# Patient Record
Sex: Female | Born: 1965 | Race: White | Hispanic: Yes | Smoking: Former smoker
Health system: Southern US, Community
[De-identification: ages and names within clinical notes are randomized; demographics above are authoritative.]

## PROBLEM LIST (undated history)

## (undated) DIAGNOSIS — E271 Primary adrenocortical insufficiency: Secondary | ICD-10-CM

## (undated) DIAGNOSIS — E039 Hypothyroidism, unspecified: Secondary | ICD-10-CM

## (undated) DIAGNOSIS — I639 Cerebral infarction, unspecified: Secondary | ICD-10-CM

## (undated) DIAGNOSIS — Z7901 Long term (current) use of anticoagulants: Secondary | ICD-10-CM

## (undated) DIAGNOSIS — R760 Raised antibody titer: Secondary | ICD-10-CM

## (undated) DIAGNOSIS — I69391 Dysphagia following cerebral infarction: Secondary | ICD-10-CM

## (undated) DIAGNOSIS — D6861 Antiphospholipid syndrome: Principal | ICD-10-CM

## (undated) DIAGNOSIS — K754 Autoimmune hepatitis: Secondary | ICD-10-CM

## (undated) HISTORY — DX: Hypothyroidism, unspecified: E03.9

## (undated) HISTORY — DX: Raised antibody titer: R76.0

## (undated) HISTORY — DX: Long term (current) use of anticoagulants: Z79.01

## (undated) HISTORY — PX: TONSILLECTOMY: SUR1361

## (undated) HISTORY — PX: APPENDECTOMY: SHX54

## (undated) HISTORY — DX: Autoimmune hepatitis: K75.4

## (undated) HISTORY — DX: Antiphospholipid syndrome: D68.61

## (undated) HISTORY — DX: Primary adrenocortical insufficiency: E27.1

## (undated) HISTORY — DX: Cerebral infarction, unspecified: I63.9

## (undated) HISTORY — DX: Dysphagia following cerebral infarction: I69.391

## (undated) HISTORY — PX: LASIK: SHX215

---

## 2005-04-29 ENCOUNTER — Other Ambulatory Visit: Admission: RE | Admit: 2005-04-29 | Discharge: 2005-04-29 | Payer: Self-pay | Admitting: Obstetrics and Gynecology

## 2005-10-18 ENCOUNTER — Encounter: Admission: RE | Admit: 2005-10-18 | Discharge: 2005-10-18 | Payer: Self-pay | Admitting: Obstetrics and Gynecology

## 2005-11-04 ENCOUNTER — Encounter: Admission: RE | Admit: 2005-11-04 | Discharge: 2005-11-04 | Payer: Self-pay | Admitting: Obstetrics and Gynecology

## 2007-05-18 ENCOUNTER — Encounter: Admission: RE | Admit: 2007-05-18 | Discharge: 2007-05-18 | Payer: Self-pay | Admitting: Obstetrics and Gynecology

## 2007-10-06 ENCOUNTER — Other Ambulatory Visit: Admission: RE | Admit: 2007-10-06 | Discharge: 2007-10-06 | Payer: Self-pay | Admitting: Obstetrics and Gynecology

## 2008-08-08 ENCOUNTER — Encounter (INDEPENDENT_AMBULATORY_CARE_PROVIDER_SITE_OTHER): Payer: Self-pay | Admitting: *Deleted

## 2008-11-22 LAB — CONVERTED CEMR LAB: Pap Smear: NORMAL

## 2008-12-12 LAB — HM MAMMOGRAPHY: HM Mammogram: NORMAL

## 2010-05-18 ENCOUNTER — Ambulatory Visit: Payer: Self-pay | Admitting: Family Medicine

## 2010-05-22 DIAGNOSIS — I639 Cerebral infarction, unspecified: Secondary | ICD-10-CM

## 2010-05-22 HISTORY — DX: Cerebral infarction, unspecified: I63.9

## 2010-05-29 ENCOUNTER — Ambulatory Visit: Payer: Self-pay | Admitting: Family Medicine

## 2010-05-29 LAB — CONVERTED CEMR LAB
Bilirubin Urine: NEGATIVE
Glucose, Urine, Semiquant: NEGATIVE
Specific Gravity, Urine: 1.025
WBC Urine, dipstick: NEGATIVE
pH: 6

## 2010-06-05 ENCOUNTER — Encounter: Payer: Self-pay | Admitting: Family Medicine

## 2010-06-05 LAB — CONVERTED CEMR LAB
Albumin: 3.8 g/dL (ref 3.5–5.2)
Basophils Absolute: 0 10*3/uL (ref 0.0–0.1)
CO2: 27 meq/L (ref 19–32)
Calcium: 8.7 mg/dL (ref 8.4–10.5)
Eosinophils Absolute: 0.2 10*3/uL (ref 0.0–0.7)
Glucose, Bld: 84 mg/dL (ref 70–99)
HCT: 36.9 % (ref 36.0–46.0)
HDL: 39.2 mg/dL (ref >39.00)
Hemoglobin: 12.7 g/dL (ref 12.0–15.0)
Lymphs Abs: 1.4 10*3/uL (ref 0.7–4.0)
MCHC: 34.5 g/dL (ref 30.0–36.0)
Monocytes Absolute: 0.3 10*3/uL (ref 0.1–1.0)
Neutro Abs: 2.6 10*3/uL (ref 1.4–7.7)
Potassium: 4.3 meq/L (ref 3.5–5.1)
RDW: 12.9 % (ref 11.5–14.6)
Sodium: 141 meq/L (ref 135–145)
TSH: 10.52 microintl units/mL — ABNORMAL HIGH (ref 0.35–5.50)

## 2010-06-23 ENCOUNTER — Encounter
Admission: RE | Admit: 2010-06-23 | Discharge: 2010-09-21 | Payer: Self-pay | Admitting: Physical Medicine & Rehabilitation

## 2010-06-23 ENCOUNTER — Telehealth: Payer: Self-pay | Admitting: Internal Medicine

## 2010-06-23 ENCOUNTER — Encounter: Payer: Self-pay | Admitting: Internal Medicine

## 2010-06-24 ENCOUNTER — Encounter: Payer: Self-pay | Admitting: Internal Medicine

## 2010-06-25 ENCOUNTER — Ambulatory Visit: Payer: Self-pay | Admitting: Physical Medicine & Rehabilitation

## 2010-06-25 ENCOUNTER — Ambulatory Visit: Payer: Self-pay | Admitting: Internal Medicine

## 2010-06-26 ENCOUNTER — Ambulatory Visit: Payer: Self-pay | Admitting: Internal Medicine

## 2010-06-26 LAB — CONVERTED CEMR LAB: INR: 3

## 2010-06-29 ENCOUNTER — Ambulatory Visit: Payer: Self-pay | Admitting: Internal Medicine

## 2010-06-29 DIAGNOSIS — E039 Hypothyroidism, unspecified: Secondary | ICD-10-CM | POA: Insufficient documentation

## 2010-06-29 LAB — CONVERTED CEMR LAB: INR: 3.2

## 2010-06-30 ENCOUNTER — Ambulatory Visit: Payer: Self-pay | Admitting: Family Medicine

## 2010-07-01 ENCOUNTER — Ambulatory Visit: Payer: Self-pay | Admitting: Oncology

## 2010-07-02 ENCOUNTER — Encounter: Payer: Self-pay | Admitting: Cardiology

## 2010-07-02 ENCOUNTER — Encounter: Payer: Self-pay | Admitting: Family Medicine

## 2010-07-07 ENCOUNTER — Ambulatory Visit: Payer: Self-pay | Admitting: Family Medicine

## 2010-07-07 ENCOUNTER — Encounter
Admission: RE | Admit: 2010-07-07 | Discharge: 2010-10-05 | Payer: Self-pay | Source: Home / Self Care | Admitting: Physical Medicine & Rehabilitation

## 2010-07-09 ENCOUNTER — Encounter: Payer: Self-pay | Admitting: Cardiology

## 2010-07-13 ENCOUNTER — Telehealth (INDEPENDENT_AMBULATORY_CARE_PROVIDER_SITE_OTHER): Payer: Self-pay

## 2010-07-14 LAB — CBC & DIFF AND RETIC
BASO%: 0.5 % (ref 0.0–2.0)
LYMPH%: 25.3 % (ref 14.0–49.7)
MCHC: 35.6 g/dL (ref 31.5–36.0)
MONO#: 0.3 10*3/uL (ref 0.1–0.9)
Platelets: 230 10*3/uL (ref 145–400)
RBC: 4.33 10*6/uL (ref 3.70–5.45)
Retic %: 0.52 % (ref 0.50–1.50)
WBC: 5.8 10*3/uL (ref 3.9–10.3)
lymph#: 1.5 10*3/uL (ref 0.9–3.3)

## 2010-07-14 LAB — CHCC SMEAR

## 2010-07-14 LAB — PROTIME-INR
INR: 1.7 — ABNORMAL LOW (ref 2.00–3.50)
Protime: 20.4 Seconds — ABNORMAL HIGH (ref 10.6–13.4)

## 2010-07-14 LAB — APTT: aPTT: 80 seconds — ABNORMAL HIGH (ref 24–37)

## 2010-07-15 ENCOUNTER — Ambulatory Visit: Payer: Self-pay | Admitting: Cardiology

## 2010-07-15 ENCOUNTER — Ambulatory Visit (HOSPITAL_COMMUNITY): Admission: RE | Admit: 2010-07-15 | Discharge: 2010-07-15 | Payer: Self-pay | Admitting: Cardiology

## 2010-07-15 ENCOUNTER — Encounter: Payer: Self-pay | Admitting: Cardiology

## 2010-07-17 LAB — HOMOCYSTEINE: Homocysteine: 8.7 umol/L (ref 4.0–15.4)

## 2010-07-17 LAB — CARDIOLIPIN ANTIBODIES, IGG, IGM, IGA: Anticardiolipin IgM: 65 MPL U/mL — ABNORMAL HIGH (ref <11)

## 2010-07-17 LAB — ANTI-NUCLEAR AB-TITER (ANA TITER): ANA Titer 1: 1:80 {titer} — ABNORMAL HIGH

## 2010-07-17 LAB — BETA-2 GLYCOPROTEIN ANTIBODIES
Beta-2-Glycoprotein I IgA: 392 A Units — ABNORMAL HIGH (ref <20)
Beta-2-Glycoprotein I IgM: 77 M Units — ABNORMAL HIGH (ref <20)

## 2010-07-17 LAB — LUPUS ANTICOAGULANT PANEL
Lupus Anticoagulant: DETECTED — AB
PTTLA Confirmation: 17.2 secs — ABNORMAL HIGH (ref <8.0)

## 2010-07-17 LAB — ANA: Anti Nuclear Antibody(ANA): POSITIVE — AB

## 2010-07-17 LAB — D-DIMER, QUANTITATIVE: D-Dimer, Quant: 0.22 ug/mL-FEU (ref 0.00–0.48)

## 2010-07-20 ENCOUNTER — Ambulatory Visit: Payer: Self-pay | Admitting: Family Medicine

## 2010-07-22 ENCOUNTER — Encounter: Payer: Self-pay | Admitting: Family Medicine

## 2010-07-23 LAB — PROTIME-INR: INR: 1.7 — ABNORMAL LOW (ref 2.00–3.50)

## 2010-07-28 ENCOUNTER — Ambulatory Visit: Payer: Self-pay | Admitting: Physical Medicine & Rehabilitation

## 2010-07-28 LAB — PROTIME-INR: Protime: 44.4 Seconds — ABNORMAL HIGH (ref 10.6–13.4)

## 2010-07-29 ENCOUNTER — Ambulatory Visit: Payer: Self-pay | Admitting: Family Medicine

## 2010-07-29 DIAGNOSIS — IMO0002 Reserved for concepts with insufficient information to code with codable children: Secondary | ICD-10-CM | POA: Insufficient documentation

## 2010-08-03 ENCOUNTER — Ambulatory Visit: Payer: Self-pay | Admitting: Oncology

## 2010-08-03 LAB — PROTIME-INR: Protime: 38.4 Seconds — ABNORMAL HIGH (ref 10.6–13.4)

## 2010-08-04 LAB — CHROMOGENIC FACTOR X: CHROM XA: 26.9 % — ABNORMAL LOW (ref 86–146)

## 2010-08-21 ENCOUNTER — Ambulatory Visit: Payer: Self-pay | Admitting: Family Medicine

## 2010-09-03 ENCOUNTER — Ambulatory Visit: Payer: Self-pay | Admitting: Oncology

## 2010-09-07 LAB — PROTIME-INR
INR: 4.2 — ABNORMAL HIGH (ref 2.00–3.50)
Protime: 50.4 Seconds — ABNORMAL HIGH (ref 10.6–13.4)

## 2010-09-08 LAB — CHROMOGENIC FACTOR X: CHROM XA: 25.5 % — ABNORMAL LOW (ref 86–146)

## 2010-09-10 ENCOUNTER — Encounter: Payer: Self-pay | Admitting: Family Medicine

## 2010-09-14 ENCOUNTER — Encounter: Payer: Self-pay | Admitting: Family Medicine

## 2010-09-15 ENCOUNTER — Ambulatory Visit: Payer: Self-pay | Admitting: Physical Medicine & Rehabilitation

## 2010-09-21 LAB — PROTIME-INR
INR: 4.1 — ABNORMAL HIGH (ref 2.00–3.50)
Protime: 49.2 Seconds — ABNORMAL HIGH (ref 10.6–13.4)

## 2010-10-20 ENCOUNTER — Encounter: Payer: Self-pay | Admitting: Family Medicine

## 2010-10-22 ENCOUNTER — Ambulatory Visit: Payer: Self-pay | Admitting: Oncology

## 2010-10-30 LAB — CHROMOGENIC FACTOR X: CHROM XA: 30.9 % — ABNORMAL LOW (ref 86–146)

## 2010-11-26 ENCOUNTER — Ambulatory Visit: Payer: Self-pay | Admitting: Oncology

## 2010-11-30 LAB — PROTIME-INR
INR: 3.5 (ref 2.00–3.50)
Protime: 42 Seconds — ABNORMAL HIGH (ref 10.6–13.4)

## 2010-12-02 LAB — CHROMOGENIC FACTOR X: CHROM XA: 21.1 % — ABNORMAL LOW (ref 86–146)

## 2010-12-04 ENCOUNTER — Encounter
Admission: RE | Admit: 2010-12-04 | Discharge: 2010-12-22 | Payer: Self-pay | Source: Home / Self Care | Attending: Physical Medicine & Rehabilitation | Admitting: Physical Medicine & Rehabilitation

## 2010-12-05 ENCOUNTER — Encounter: Payer: Self-pay | Admitting: *Deleted

## 2010-12-05 DIAGNOSIS — I639 Cerebral infarction, unspecified: Secondary | ICD-10-CM

## 2010-12-05 DIAGNOSIS — Z7901 Long term (current) use of anticoagulants: Secondary | ICD-10-CM

## 2010-12-08 ENCOUNTER — Ambulatory Visit
Admission: RE | Admit: 2010-12-08 | Discharge: 2010-12-08 | Payer: Self-pay | Source: Home / Self Care | Attending: Physical Medicine & Rehabilitation | Admitting: Physical Medicine & Rehabilitation

## 2010-12-13 ENCOUNTER — Encounter: Payer: Self-pay | Admitting: Obstetrics and Gynecology

## 2010-12-14 ENCOUNTER — Encounter
Admission: RE | Admit: 2010-12-14 | Discharge: 2010-12-22 | Payer: Self-pay | Source: Home / Self Care | Attending: Physical Medicine & Rehabilitation | Admitting: Physical Medicine & Rehabilitation

## 2010-12-15 LAB — CBC WITH DIFFERENTIAL/PLATELET
Basophils Absolute: 0 10*3/uL (ref 0.0–0.1)
EOS%: 9.7 % — ABNORMAL HIGH (ref 0.0–7.0)
Eosinophils Absolute: 0.4 10*3/uL (ref 0.0–0.5)
HGB: 12.5 g/dL (ref 11.6–15.9)
MCH: 30.5 pg (ref 25.1–34.0)
MONO#: 0.3 10*3/uL (ref 0.1–0.9)
NEUT#: 2.4 10*3/uL (ref 1.5–6.5)
RDW: 12.7 % (ref 11.2–14.5)
WBC: 4.6 10*3/uL (ref 3.9–10.3)
lymph#: 1.4 10*3/uL (ref 0.9–3.3)

## 2010-12-15 LAB — PROTIME-INR: INR: 4.9 — ABNORMAL HIGH (ref 2.00–3.50)

## 2010-12-21 LAB — PROTIME-INR
INR: 3.6 — ABNORMAL HIGH (ref 2.00–3.50)
Protime: 43.2 Seconds — ABNORMAL HIGH (ref 10.6–13.4)

## 2010-12-22 NOTE — Letter (Signed)
Summary: [facility] Office Visit Note   [facility] Office Visit Note   Imported By: Roderic Ovens 07/15/2010 13:04:55  _____________________________________________________________________  External Attachment:    Type:   Image     Comment:   External Document

## 2010-12-22 NOTE — Assessment & Plan Note (Signed)
Summary: PT check/drb  Nurse Visit   Vital Signs:  Patient profile:   45 year old female Weight:      128.38 pounds Pulse rate:   44 / minute Pulse rhythm:   regular BP sitting:   116 / 76  (left arm) Cuff size:   regular  Vitals Entered By: Army Fossa CMA (June 25, 2010 12:06 PM)  Impression & Recommendations:  Problem # 1:  ENCOUNTER FOR LONG-TERM USE OF ANTICOAGULANTS (ICD-V58.61)  recent stroke INR quite elevated  Orders: Protime ([id number])  Complete Medication List: 1)  Coumadin 5 Mg Tabs (Warfarin sodium) .... As directed 2)  Aspir-low 81 Mg Tbec (Aspirin)  CC: PT/INR   Allergies (verified): No Known Drug Allergies Laboratory Results   Blood Tests      INR: 7.4   (Normal Range: 0.88-1.12   Therap INR: 2.0-3.5) Comments: 5 mg daily ( Has been on Lovenox shots two times a day up until yesterday)  ---------------------------------------------- Hold Coumadin INR tomorrow ER or call if: headache, abdominal pain, any sort of bleeding Chaquana Nichols E. Caylan Chenard MD  June 25, 2010 6:05 PM     Orders Added: 1)  Est. Patient Level I [99211] 2)  Protime [[id number]]  Appended Document: PT check/drb got message to hold coumadin, having INR checked today with Dr Drue Novel and will f/u next week with Dr Clent Ridges

## 2010-12-22 NOTE — Miscellaneous (Signed)
  Clinical Lists Changes  Medications: Added new medication of SYNTHROID 75 MCG TABS (LEVOTHYROXINE SODIUM) 1 once daily - Signed Rx of SYNTHROID 75 MCG TABS (LEVOTHYROXINE SODIUM) 1 once daily;  #90 x 0;  Signed;  Entered by: Raechel Ache, RN;  Authorized by: Nelwyn Salisbury MD;  Method used: Electronically to [facility]. [place]. [phone]*, [address] [place], [place], [place]  [address], Ph: [phone] or [phone], Fax: [phone]    Prescriptions: SYNTHROID 75 MCG TABS (LEVOTHYROXINE SODIUM) 1 once daily  #90 x 0   Entered by:   Raechel Ache, RN   Authorized by:   Nelwyn Salisbury MD   Signed by:   Raechel Ache, RN on 06/05/2010   Method used:   Electronically to        [facility]. [address] [phone]* (retail)       [address]       [place], [place]  [address]       Ph: [phone] or [phone]       Fax: [phone]   RxID:   [id number]

## 2010-12-22 NOTE — Progress Notes (Signed)
  Phone Note Outgoing Call   Summary of Call: patient had a stroke last week while visiting [place] She is coming back to [place] tomorrow She is on Lovenox and Coumadin Plan: Refer to physical therapy ASAP Refer to neurology, Dr. Pearlean Brownie Monitor Coumadin and discontinue Lovenox when appropriate Jose E. Paz MD  June 23, 2010 8:58 AM   Follow-up for Phone Call        --INR 2.2, on Coumadin 5 mg for the last 5 days --spoke with husband, Asencion Noble, continue with Coumadin 5 mg daily --continue checking INRs daily --she has an appointment to see Dr Berenda Morale  in 2 days Jose E. Paz MD  June 23, 2010 5:04 PM   New Problems: CVA (ICD-434.91)   New Problems: CVA (ICD-434.91)

## 2010-12-22 NOTE — Progress Notes (Signed)
Summary: TEE  Phone Note Outgoing Call   Call placed by: Julieta Gutting, RN, BSN,  July 13, 2010 9:04 AM Call placed to: Patient Summary of Call: Left pt a message to call back in regards to TEE. Julieta Gutting, RN, BSN  July 13, 2010 9:04 AM  Follow-up for Phone Call        Pt returning call [phone]  Natalie Mullen  July 13, 2010 1:00 PM  I spoke with the pt's husband and reviewed TEE instructions.  Follow-up by: Julieta Gutting, RN, BSN,  July 13, 2010 1:10 PM

## 2010-12-22 NOTE — Letter (Signed)
Summary: TEE Instructions  [facility], Main Office  [address]   [place], [place] [address]   Phone: [phone]  Fax: [phone]      TEE Instructions  07/09/2010 MRN: [id number]  TERRESA MARLETT [address] [place], [place]  [address]      You are scheduled for a TEE on Wednesday July 15, 2010 with Dr. Shirlee Latch.  Please arrive at the [facility] at 12:00 noon on the day of your procedure.  1)   Diet:     A)   No solid food after midnight.  You may have a clear liquid breakfast.          Then nothing to drink after 7:00 a.m.  Clear liquids include:  water,        broth,    Sprite, Ginger Ale, black cofee, tea (no sugar), cranberry /        grape / apple juice, jello (not red), popsicle from clear juices (not red).  2)  Must have a responsible person to drive you home.  3)   Bring your current insurance cards and current list of all your medications.   *Special Note:  Every effort is made to have your procedure done on time.  Occasionally there are emergencies that present themselves at the hospital that may cause delays.  Please be patient if a delay does occur.  *If you have any questions after you get home, please call the office at [phone].

## 2010-12-22 NOTE — Letter (Signed)
Summary: [facility]  [facility]   Imported By: Maryln Gottron 08/10/2010 09:40:40  _____________________________________________________________________  External Attachment:    Type:   Image     Comment:   External Document

## 2010-12-22 NOTE — Assessment & Plan Note (Signed)
Summary: PT CHECK///SPH  Nurse Visit   Vital Signs:  Patient profile:   45 year old female Weight:      128 pounds Pulse rate:   43 / minute Pulse rhythm:   regular BP sitting:   106 / 70  (left arm) Cuff size:   regular  Vitals Entered By: Army Fossa CMA (June 29, 2010 11:15 AM)  Impression & Recommendations:  Problem # 1:  ENCOUNTER FOR LONG-TERM USE OF ANTICOAGULANTS (ICD-V58.61) see  instructions currently on Coumadin and aspirin Orders: Protime ([id number])  Problem # 2:  HYPOTHYROIDISM (ICD-244.9) no taking Synthroid since her stroke,  hesitant to take it because had a stroke few weeks after she started Synthroid. I doubt symptoms have anything to do with that event recommend to discuss with PCP, recheck labs?  Labs Reviewed: TSH: 10.52 (05/29/2010)    Chol: 139 (05/29/2010)   HDL: 39.20 (05/29/2010)   LDL: 84 (05/29/2010)   TG: 81.0 (05/29/2010)  Complete Medication List: 1)  Coumadin 5 Mg Tabs (Warfarin sodium) .... As directed 2)  Aspir-low 81 Mg Tbec (Aspirin)   Current Medications (verified): 1)  Coumadin 5 Mg Tabs (Warfarin Sodium) .... As Directed 2)  Aspir-Low 81 Mg Tbec (Aspirin)  Allergies (verified): No Known Drug Allergies Laboratory Results   Blood Tests      INR: 3.2   (Normal Range: 0.88-1.12   Therap INR: 2.0-3.5) Comments: takes 5mg  every day except m,w,f takes 1/2 tab  -------------------------------------------------------------- no cange to check INR tomorrow w/ PCP Nolon Rod. Paz MD  June 29, 2010 11:18 AM     Orders Added: 1)  Protime [[id number]]

## 2010-12-22 NOTE — Assessment & Plan Note (Signed)
Summary: leg pain//ccm   Vital Signs:  Patient profile:   45 year old female Weight:      132 pounds O2 Sat:      96 % Temp:     98.4 degrees F Pulse rate:   65 / minute BP sitting:   90 / 62  (left arm) Cuff size:   regular  Vitals Entered By: Pura Spice, RN (July 29, 2010 11:05 AM) CC: pain left groin area x 1 wk saw dr Baxter Hire yest (rehab pain management)  states started doing exercises recommended and now leg worse Is Patient Diabetic? No   History of Present Illness: Here with her husband for one week of sharp pains in the left anterior groin. No pain in the back or down the leg. No recent trauma. She has been exercising per Dr. Wynn Banker' instructions, but this seems to make the pain worse.   Allergies (verified): No Known Drug Allergies  Past History:  Past Medical History: Reviewed history from 06/30/2010 and no changes required. 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 Gerald Leitz for GYN exams Hypothyroidism positive anticardiolipin antibodies, positive lupus anticoagulant, sees Dr. Cyndie Chime sees Dr. Claudette Laws for rehab sees Dr. Delia Heady for s/p stroke  Past Surgical History: Reviewed history from 07/20/2010 and no changes required. Appendectomy 1974 Tonsillectomy 1972 LASIK normal TEE 07-15-10 per Dr. Freida Busman  Review of Systems  The patient denies anorexia, fever, weight loss, weight gain, vision loss, decreased hearing, hoarseness, chest pain, syncope, dyspnea on exertion, peripheral edema, prolonged cough, headaches, hemoptysis, abdominal pain, melena, hematochezia, severe indigestion/heartburn, hematuria, incontinence, genital sores, muscle weakness, suspicious skin lesions, transient blindness, difficulty walking, depression, unusual weight change, abnormal bleeding, enlarged lymph nodes, angioedema, breast masses, and testicular masses.    Physical Exam  General:   limping a bit Msk:  very tender over the insertion of the left hip flexor muscles onto the anterior pelvis. No masses. Full ROM but hip flexion against resistance is quite painful   Impression & Recommendations:  Problem # 1:  SPRAIN&STRAIN OF UNSPECIFIED SITE OF HIP&THIGH (ICD-843.9)  Complete Medication List: 1)  Coumadin 5 Mg Tabs (Warfarin sodium) .... As directed 2)  Synthroid 75 Mcg Tabs (Levothyroxine sodium) .... Once daily 3)  Aspirin 81 Mg Tbec (Aspirin) .... Once daily  Patient Instructions: 1)  She has a hip flexor strain. Advised her to stop exercising and rest the leg for at least a week. use Ice packs several times a day. Try Tylenol.  2)  Please schedule a follow-up appointment as needed .

## 2010-12-22 NOTE — Letter (Signed)
Summary: [facility]   [facility]   Imported By: Maryln Gottron 10/05/2010 15:20:43  _____________________________________________________________________  External Attachment:    Type:   Image     Comment:   External Document

## 2010-12-22 NOTE — Consult Note (Signed)
Summary: [facility]  [facility]   Imported By: Maryln Gottron 07/08/2010 10:55:12  _____________________________________________________________________  External Attachment:    Type:   Image     Comment:   External Document

## 2010-12-22 NOTE — Assessment & Plan Note (Signed)
Summary: fu on stroke/njr   Vital Signs:  Patient profile:   45 year old female Weight:      128 pounds BMI:     20.73 O2 Sat:      99 % on Room air Pulse rate:   52 / minute Pulse rhythm:   regular BP sitting:   84 / 68  (left arm) Cuff size:   regular  Vitals Entered By: Raechel Ache, RN (June 30, 2010 3:58 PM)  O2 Flow:  Room air CC: F/u stroke; feels tired and sleepy and right side weak.    History of Present Illness: Here with her husband to follow up a left middle cerebral artery stroke which occurred on 06-17-10 while vacationing in [place], [place]. She had the sudden onset of right hemiparesis and aphasia, and fortunately was rushed to a nearby [place] facility where she received antithrombotic therapy. When peripheral TPA was unsuccessful, she had intra-arterial TPA to the left middle cerebral artery, and this was successful at lysing the thrombus. After that her neurologic status improved dramatically. Her workup revealed her to have positive anticardiolipin antibodies, and the combination of this with her years of smoking were felt to have caused her embolic event. Since that day she has totally stopped smoking, and now her husband os getting serious about quitting smoking as well. She received a a Lovenox bridge and was started on Coumadin. She has been back in [place] since this past weekend, and we have been adjusting her Coumadin dose. Today she feels fairly well except for some generalized fatigue and some mild weakness in the right leg. her speech is back to normal, and her right arm and hand are almost back to normal. She is to follow up with Dr. Pearlean Brownie for the stroke, with Dr. Wynn Banker for rehab, and with Dr. Cyndie Chime for her hypercoagulable state.   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 Gerald Leitz for GYN  exams Hypothyroidism positive anticardiolipin antibodies, positive lupus anticoagulant, sees Dr. Cyndie Chime sees Dr. Claudette Laws for rehab sees Dr. Delia Heady for s/p stroke  Past Surgical History: Reviewed history from 05/18/2010 and no changes required. Appendectomy 1974 Tonsillectomy 1972 LASIK  Review of Systems  The patient denies anorexia, fever, weight loss, weight gain, vision loss, decreased hearing, hoarseness, chest pain, syncope, dyspnea on exertion, peripheral edema, prolonged cough, headaches, hemoptysis, abdominal pain, melena, hematochezia, severe indigestion/heartburn, hematuria, incontinence, genital sores, muscle weakness, suspicious skin lesions, transient blindness, difficulty walking, depression, unusual weight change, abnormal bleeding, enlarged lymph nodes, angioedema, breast masses, and testicular masses.    Physical Exam  General:  Well-developed,well-nourished,in no acute distress; alert,appropriate and cooperative throughout examination Neck:  No deformities, masses, or tenderness noted. Lungs:  Normal respiratory effort, chest expands symmetrically. Lungs are clear to auscultation, no crackles or wheezes. Heart:  Normal rate and regular rhythm. S1 and S2 normal without gallop, murmur, click, rub or other extra sounds. Neurologic:  alert & oriented X3 and cranial nerves II-XII intact.  Motor is intact except for some mild right lower extremity weakness. Her gait is normal. her speech is normal.    Impression & Recommendations:  Problem # 1:  CVA (ICD-434.91)  Her updated medication list for this problem includes:    Coumadin 5 Mg Tabs (Warfarin sodium) .Marland Kitchen... 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 Once daily  Complete Medication List: 1)  Coumadin 5 Mg Tabs (Warfarin sodium) .... As directed 2)  Aspir-low 81 Mg Tbec (Aspirin) 3)  Synthroid 75 Mcg Tabs (Levothyroxine sodium) .... Once daily  Other Orders: Protime ([id number])  Patient Instructions: 1)  Decrease the Coumadin to 2.5 mg on Mon, Tues, Thurs, and Friday. Take 5 mg on Wed, Sat, and Sun. Recheck with an INR in one week. Continue a baby aspirin daily. We will make sure she gets in with the doctors above for followup. see me in one week    ANTICOAGULATION RECORD PREVIOUS REGIMEN & LAB RESULTS   Previous INR:  3.2 on  06/29/2010    NEW REGIMEN & LAB RESULTS Anticoag. Dx: 434.91 Current INR Goal Range: 2.0-3.0 Current INR: 3.9 Current Coumadin Dose(mg): 2.5mg . M, W, F all other days 5mg . Regimen:   (no change)   Anticoagulation Visit Questionnaire Coumadin dose missed/changed:  No Abnormal Bleeding Symptoms:  No  Any diet changes including alcohol intake, vegetables or greens since the last visit:  No Any illnesses or hospitalizations since the last visit:  No Any signs of clotting since the last visit (including chest discomfort, dizziness, shortness of breath, arm tingling, slurred speech, swelling or redness in leg):  No  MEDICATIONS COUMADIN 5 MG TABS (WARFARIN SODIUM) as directed ASPIR-LOW 81 MG TBEC (ASPIRIN)  SYNTHROID 75 MCG TABS (LEVOTHYROXINE SODIUM) once daily      Laboratory Results   Blood Tests      INR: 3.9   (Normal Range: 0.88-1.12   Therap INR: 2.0-3.5) Comments: Rita Ohara  June 30, 2010 5:15 PM

## 2010-12-22 NOTE — Assessment & Plan Note (Signed)
Summary: PT/INR  Nurse Visit   Vital Signs:  Patient profile:   45 year old female Weight:      128 pounds Pulse rate:   48 / minute Pulse rhythm:   regular BP sitting:   106 / 70  (left arm) Cuff size:   regular  Vitals Entered By: Army Fossa CMA (June 26, 2010 3:57 PM) CC: pt/inr   Allergies (verified): No Known Drug Allergies Laboratory Results   Blood Tests      INR: 3.0   (Normal Range: 0.88-1.12   Therap INR: 2.0-3.5) Comments: held coumadin last night. has 5mg  tabs ----------------------------------- current: 5mg  1 a day new  5mg  tab: 1 a day, 1/2 MWF nexy I NR 3 days  Jose E. Paz MD  June 26, 2010 3:59 PM     Patient Instructions: 1)  coumadin 5mg  : 2)  1 by mouth once daily except Monday, Wensday, Friday take only take half tablet 3)  next check Monday 06-29-10   Orders Added: 1)  Protime [[id number]]

## 2010-12-22 NOTE — Letter (Signed)
Summary: PATIENT HX FORMS  PATIENT HX FORMS   Imported By: Georgian Co 09/10/2010 12:26:17  _____________________________________________________________________  External Attachment:    Type:   Image     Comment:   External Document

## 2010-12-22 NOTE — Assessment & Plan Note (Signed)
Summary: 1 month rov/njr hus rsc/njr   Vital Signs:  Patient profile:   45 year old female Weight:      132 pounds O2 Sat:      99 % Temp:     97.9 degrees F Pulse rate:   99 / minute BP sitting:   100 / 60  (left arm)  Vitals Entered By: Pura Spice, RN (August 21, 2010 9:55 AM) CC: follow-up visit leg pain groin ":much better"   History of Present Illness: here to follow up from a stroke and recently diagnosed hypothyroidism. She is doing extremely well. She feels well and has even been out playing tennis again. She sees Dr. Cyndie Chime regularly, and he is following her Coumadin dosing.   Allergies (verified): No Known Drug Allergies  Past History:  Past Medical History: Reviewed history from 06/30/2010 and no changes required. 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 Gerald Leitz for GYN exams Hypothyroidism positive anticardiolipin antibodies, positive lupus anticoagulant, sees Dr. Cyndie Chime sees Dr. Claudette Laws for rehab sees Dr. Delia Heady for s/p stroke  Past Surgical History: Reviewed history from 07/20/2010 and no changes required. Appendectomy 1974 Tonsillectomy 1972 LASIK normal TEE 07-15-10 per Dr. Freida Busman  Review of Systems  The patient denies anorexia, fever, weight loss, weight gain, vision loss, decreased hearing, hoarseness, chest pain, syncope, dyspnea on exertion, peripheral edema, prolonged cough, headaches, hemoptysis, abdominal pain, melena, hematochezia, severe indigestion/heartburn, hematuria, incontinence, genital sores, muscle weakness, suspicious skin lesions, transient blindness, difficulty walking, depression, unusual weight change, abnormal bleeding, enlarged lymph nodes, angioedema, breast masses, and testicular masses.    Physical Exam  General:  Well-developed,well-nourished,in no acute distress; alert,appropriate and cooperative throughout  examination Neck:  No deformities, masses, or tenderness noted. Lungs:  Normal respiratory effort, chest expands symmetrically. Lungs are clear to auscultation, no crackles or wheezes. Heart:  Normal rate and regular rhythm. S1 and S2 normal without gallop, murmur, click, rub or other extra sounds. Neurologic:  No cranial nerve deficits noted. Station and gait are normal. Plantar reflexes are down-going bilaterally. DTRs are symmetrical throughout. Sensory, motor and coordinative functions appear intact.   Impression & Recommendations:  Problem # 1:  HYPOTHYROIDISM (ICD-244.9)  Her updated medication list for this problem includes:    Synthroid 75 Mcg Tabs (Levothyroxine sodium) ..... Once daily  Orders: Venipuncture ([id number]) TLB-TSH (Thyroid Stimulating Hormone) (84443-TSH)  Problem # 2:  CVA (ICD-434.91)  Her updated medication list for this problem includes:    Coumadin 5 Mg Tabs (Warfarin sodium) .Marland Kitchen... As directed    Aspirin 81 Mg Tbec (Aspirin) ..... Once daily  Complete Medication List: 1)  Coumadin 5 Mg Tabs (Warfarin sodium) .... As directed 2)  Synthroid 75 Mcg Tabs (Levothyroxine sodium) .... Once daily 3)  Aspirin 81 Mg Tbec (Aspirin) .... Once daily  Patient Instructions: 1)  check a TSH today.   Appended Document: Orders Update    Clinical Lists Changes  Orders: Added new Service order of Specimen Handling ([id number]) - Signed

## 2010-12-22 NOTE — Assessment & Plan Note (Signed)
Summary: to be est/njr   Vital Signs:  Patient profile:   45 year old female Height:      66 inches Weight:      132 pounds BMI:     21.38 Pulse rate:   64 / minute Pulse rhythm:   regular BP sitting:   104 / 58  (left arm) Cuff size:   regular  Vitals Entered By: Raechel Ache, RN (May 18, 2010 1:28 PM) CC: To Establish, denies complaints.   History of Present Illness: 45 yr old female to establish with [place] and for a cpx. She feels fine and has no concerns. She has not seen a primary care doctor in many years.   Preventive Screening-Counseling & Management  Alcohol-Tobacco     Smoking Status: current  Caffeine-Diet-Exercise     Does Patient Exercise: yes      Drug Use:  no.    Allergies (verified): No Known Drug Allergies  Past History:  Past Medical History: Unremarkable sees Dr. Gerald Leitz for GYN exams  Past Surgical History: Appendectomy 1974 Tonsillectomy 1972 LASIK  Family History: Reviewed history and no changes required. Family History of Arthritis Family History Hypertension Family History of Stroke M 1st degree relative <50  Social History: Reviewed history and no changes required. Occupation: Research scientist (medical) Married Current Smoker Alcohol use-no Drug use-no Regular exercise-yes Occupation:  employed Smoking Status:  current Drug Use:  no Does Patient Exercise:  yes  Review of Systems  The patient denies anorexia, fever, weight loss, weight gain, vision loss, decreased hearing, hoarseness, chest pain, syncope, dyspnea on exertion, peripheral edema, prolonged cough, headaches, hemoptysis, abdominal pain, melena, hematochezia, severe indigestion/heartburn, hematuria, incontinence, genital sores, muscle weakness, suspicious skin lesions, transient blindness, difficulty walking, depression, unusual weight change, abnormal bleeding, enlarged lymph nodes, angioedema, breast masses, and testicular masses.    Physical Exam  General:   Well-developed,well-nourished,in no acute distress; alert,appropriate and cooperative throughout examination Head:  Normocephalic and atraumatic without obvious abnormalities. No apparent alopecia or balding. Eyes:  No corneal or conjunctival inflammation noted. EOMI. Perrla. Funduscopic exam benign, without hemorrhages, exudates or papilledema. Vision grossly normal. Ears:  External ear exam shows no significant lesions or deformities.  Otoscopic examination reveals clear canals, tympanic membranes are intact bilaterally without bulging, retraction, inflammation or discharge. Hearing is grossly normal bilaterally. Nose:  External nasal examination shows no deformity or inflammation. Nasal mucosa are pink and moist without lesions or exudates. Mouth:  Oral mucosa and oropharynx without lesions or exudates.  Teeth in good repair. Neck:  No deformities, masses, or tenderness noted. Chest Wall:  No deformities, masses, or tenderness noted. Lungs:  Normal respiratory effort, chest expands symmetrically. Lungs are clear to auscultation, no crackles or wheezes. Heart:  Normal rate and regular rhythm. S1 and S2 normal without gallop, murmur, click, rub or other extra sounds. Abdomen:  Bowel sounds positive,abdomen soft and non-tender without masses, organomegaly or hernias noted. Msk:  No deformity or scoliosis noted of thoracic or lumbar spine.   Pulses:  R and L carotid,radial,femoral,dorsalis pedis and posterior tibial pulses are full and equal bilaterally Extremities:  No clubbing, cyanosis, edema, or deformity noted with normal full range of motion of all joints.   Neurologic:  No cranial nerve deficits noted. Station and gait are normal. Plantar reflexes are down-going bilaterally. DTRs are symmetrical throughout. Sensory, motor and coordinative functions appear intact. Skin:  Intact without suspicious lesions or rashes Cervical Nodes:  No lymphadenopathy noted Axillary Nodes:  No palpable  lymphadenopathy  Inguinal Nodes:  No significant adenopathy Psych:  Cognition and judgment appear intact. Alert and cooperative with normal attention span and concentration. No apparent delusions, illusions, hallucinations   Impression & Recommendations:  Problem # 1:  HEALTH MAINTENANCE EXAM (ICD-V70.0)  Patient Instructions: 1)  set up fasting labs soon   Preventive Care Screening  Last Tetanus Booster:    Date:  02/20/2010    Results:  Td   Mammogram:    Date:  11/22/2008    Results:  normal   Pap Smear:    Date:  11/22/2008    Results:  normal

## 2010-12-22 NOTE — Assessment & Plan Note (Signed)
Summary: 1 week fup---then pt//ccm   Vital Signs:  Patient profile:   45 year old female Weight:      128 pounds Pulse rate:   48 / minute Pulse rhythm:   regular BP sitting:   106 / 70  (left arm) Cuff size:   regular  Vitals Entered By: Raechel Ache, RN (July 07, 2010 8:49 AM) CC: 1 week f/u, feeling well.   History of Present Illness: Here to follow up a recent stroke. She is doing very well. She met with Dr. Pearlean Brownie last week, and he told her to stop the aspirin. She is start OT, ST, and PT this week. She has no complaints today. Her mood seems to be excellent.   Allergies: No Known Drug Allergies  Past History:  Past Medical History: Reviewed history from 06/30/2010 and no changes required. 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 Gerald Leitz for GYN exams Hypothyroidism positive anticardiolipin antibodies, positive lupus anticoagulant, sees Dr. Cyndie Chime sees Dr. Claudette Laws for rehab sees Dr. Delia Heady for s/p stroke  Review of Systems  The patient denies anorexia, fever, weight loss, weight gain, vision loss, decreased hearing, hoarseness, chest pain, syncope, dyspnea on exertion, peripheral edema, prolonged cough, headaches, hemoptysis, abdominal pain, melena, hematochezia, severe indigestion/heartburn, hematuria, incontinence, genital sores, muscle weakness, suspicious skin lesions, transient blindness, difficulty walking, depression, unusual weight change, abnormal bleeding, enlarged lymph nodes, angioedema, breast masses, and testicular masses.    Physical Exam  General:  Well-developed,well-nourished,in no acute distress; alert,appropriate and cooperative throughout examination Lungs:  Normal respiratory effort, chest expands symmetrically. Lungs are clear to auscultation, no crackles or wheezes. Heart:  Normal rate and regular rhythm. S1 and S2 normal without gallop, murmur,  click, rub or other extra sounds. Neurologic:  No cranial nerve deficits noted. Station and gait are normal. Plantar reflexes are down-going bilaterally. DTRs are symmetrical throughout. Sensory, motor and coordinative functions appear intact. Her speech is clear but she still has slight difficulty at times putting her thoughts into words   Impression & Recommendations:  Problem # 1:  CVA (ICD-434.91)  The following medications were removed from the medication list:    Aspir-low 81 Mg Tbec (Aspirin) Her updated medication list for this problem includes:    Coumadin 5 Mg Tabs (Warfarin sodium) .Marland Kitchen... 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 Once daily  Complete Medication List: 1)  Coumadin 5 Mg Tabs (Warfarin sodium) .... As directed 2)  Synthroid 75 Mcg Tabs (Levothyroxine sodium) .... Once daily  Patient Instructions: 1)  Please schedule a follow-up appointment in 2 weeks.    ANTICOAGULATION RECORD PREVIOUS REGIMEN & LAB RESULTS Anticoagulation Diagnosis:  434.91 on  06/30/2010 Previous INR Goal Range:  2.0-3.0 on  06/30/2010 Previous INR:  3.9 on  06/30/2010 Previous Coumadin Dose(mg):  2.5mg . M, W, F all other days 5mg . on  06/30/2010   NEW REGIMEN & LAB RESULTS Current INR: 2.7 Regimen:   (no change)   Anticoagulation Visit Questionnaire Coumadin dose missed/changed:  No Abnormal Bleeding Symptoms:  No  Any diet changes including alcohol intake, vegetables or greens since the last visit:  No Any illnesses or hospitalizations since the last visit:  No Any  signs of clotting since the last visit (including chest discomfort, dizziness, shortness of breath, arm tingling, slurred speech,  swelling or redness in leg):  No  MEDICATIONS COUMADIN 5 MG TABS (WARFARIN SODIUM) as directed SYNTHROID 75 MCG TABS (LEVOTHYROXINE SODIUM) once daily    Laboratory Results   Blood Tests      INR: 2.7   (Normal Range: 0.88-1.12   Therap INR: 2.0-3.5) Comments: Rita Ohara  July 07, 2010 8:46 AM

## 2010-12-22 NOTE — Letter (Signed)
Summary: [facility]  [facility]   Imported By: Maryln Gottron 10/27/2010 10:17:07  _____________________________________________________________________  External Attachment:    Type:   Image     Comment:   External Document

## 2010-12-22 NOTE — Assessment & Plan Note (Signed)
Summary: 2 week fup//ccm [facility] PER HUS CHRIS/NJR   Vital Signs:  Patient profile:   45 year old female Weight:      127 pounds BP sitting:   106 / 72  (left arm) Cuff size:   regular  Vitals Entered By: Raechel Ache, RN (July 20, 2010 8:58 AM) CC: 2 week f/u- doing ok.   History of Present Illness: Here to follow up after a stroke. She had a TEE last week per Dr. Freida Busman which was normal, therefore no source of embolus was found. Also, she had a bubble study done by Dr. Pearlean Brownie which was normal. She feels well, and she continues with therapy. She will see Dr. Cyndie Chime soon.   Allergies: No Known Drug Allergies  Past History:  Past Medical History: Reviewed history from 06/30/2010 and no changes required. 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 Gerald Leitz for GYN exams Hypothyroidism positive anticardiolipin antibodies, positive lupus anticoagulant, sees Dr. Cyndie Chime sees Dr. Claudette Laws for rehab sees Dr. Delia Heady for s/p stroke  Past Surgical History: Appendectomy 1974 Tonsillectomy 1972 LASIK normal TEE 07-15-10 per Dr. Freida Busman  Review of Systems  The patient denies anorexia, fever, weight loss, weight gain, vision loss, decreased hearing, hoarseness, chest pain, syncope, dyspnea on exertion, peripheral edema, prolonged cough, headaches, hemoptysis, abdominal pain, melena, hematochezia, severe indigestion/heartburn, hematuria, incontinence, genital sores, muscle weakness, suspicious skin lesions, transient blindness, difficulty walking, depression, unusual weight change, abnormal bleeding, enlarged lymph nodes, angioedema, breast masses, and testicular masses.    Physical Exam  General:  Well-developed,well-nourished,in no acute distress; alert,appropriate and cooperative throughout examination Neck:  No deformities, masses, or tenderness noted. Lungs:  Normal respiratory effort, chest  expands symmetrically. Lungs are clear to auscultation, no crackles or wheezes. Heart:  Normal rate and regular rhythm. S1 and S2 normal without gallop, murmur, click, rub or other extra sounds. Neurologic:  No cranial nerve deficits noted. Station and gait are normal. Plantar reflexes are down-going bilaterally. DTRs are symmetrical throughout. Sensory, motor and coordinative functions appear intact.   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 Once daily  Problem # 3:  CVA (ICD-434.91)  Her updated medication list for this problem includes:    Coumadin 5 Mg Tabs (Warfarin sodium) .Marland Kitchen... As directed    Coumadin 2.5 Mg Tabs (Warfarin sodium) .Marland Kitchen... As directed  Orders: Protime ([id number]) Fingerstick ([id number])  Complete Medication List: 1)  Coumadin 5 Mg Tabs (Warfarin sodium) .... As directed 2)  Synthroid 75 Mcg Tabs (Levothyroxine sodium) .... Once daily 3)  Coumadin 2.5 Mg Tabs (Warfarin sodium) .... As directed  Patient Instructions: 1)  Continue with current meds. see Dr. Cyndie Chime.  I told her she could resume driving at this point.  2)  Please schedule a follow-up appointment in 1 month.  Prescriptions: COUMADIN 2.5 MG TABS (WARFARIN SODIUM) as directed  #60 x 11   Entered and Authorized by:   Nelwyn Salisbury MD   Signed by:   Nelwyn Salisbury MD on 07/20/2010   Method used:   Electronically to        [facility] [address] [phone]* (retail)       [address]       [place]  [place], [place]  [address]       Ph: [phone] or [phone]       Fax: [phone]   RxID:   [id number] COUMADIN 5 MG TABS (WARFARIN SODIUM) as directed  #60 x 11   Entered and Authorized by:   Nelwyn Salisbury MD   Signed by:   Nelwyn Salisbury MD on 07/20/2010   Method used:    Electronically to        [facility] [address] [phone]* (retail)       [address]       [place], [place]  [address]       Ph: [phone] or [phone]       Fax: [phone]   RxID:   [id number]    ANTICOAGULATION RECORD PREVIOUS REGIMEN & LAB RESULTS Anticoagulation Diagnosis:  434.91 on  06/30/2010 Previous INR Goal Range:  2.0-3.0 on  06/30/2010 Previous INR:  2.7 on  07/07/2010 Previous Coumadin Dose(mg):  2.5mg . M, W, F all other days 5mg . on  06/30/2010   NEW REGIMEN & LAB RESULTS Current INR: 2.1 Regimen: same  Repeat testing in: 4 weeks  Anticoagulation Visit Questionnaire Coumadin dose missed/changed:  No Abnormal Bleeding Symptoms:  No  Any diet changes including alcohol intake, vegetables or greens since the last visit:  No Any illnesses or hospitalizations since the last visit:  No Any signs of clotting since the last visit (including chest discomfort, dizziness, shortness of breath, arm tingling, slurred speech, swelling or redness in leg):  No  MEDICATIONS COUMADIN 5 MG TABS (WARFARIN SODIUM) as directed SYNTHROID 75 MCG TABS (LEVOTHYROXINE SODIUM) once daily COUMADIN 2.5 MG TABS (WARFARIN SODIUM) as directed    Laboratory Results   Blood Tests      INR: 2.1   (Normal Range: 0.88-1.12   Therap INR: 2.0-3.5) Comments: Rita Ohara  July 20, 2010 8:52 AM

## 2011-01-18 ENCOUNTER — Other Ambulatory Visit: Payer: Self-pay | Admitting: Oncology

## 2011-01-18 ENCOUNTER — Encounter (HOSPITAL_BASED_OUTPATIENT_CLINIC_OR_DEPARTMENT_OTHER): Payer: BC Managed Care – PPO | Admitting: Oncology

## 2011-01-18 DIAGNOSIS — Z8673 Personal history of transient ischemic attack (TIA), and cerebral infarction without residual deficits: Secondary | ICD-10-CM

## 2011-01-18 DIAGNOSIS — D6859 Other primary thrombophilia: Secondary | ICD-10-CM

## 2011-01-18 DIAGNOSIS — Z7901 Long term (current) use of anticoagulants: Secondary | ICD-10-CM

## 2011-01-18 LAB — CHROMOGENIC FACTOR X: CHROM XA: 22 % — ABNORMAL LOW (ref 86–146)

## 2011-01-18 LAB — PROTIME-INR
INR: 4.7 — ABNORMAL HIGH (ref 2.00–3.50)
Protime: 56.4 Seconds — ABNORMAL HIGH (ref 10.6–13.4)

## 2011-01-22 ENCOUNTER — Telehealth: Payer: Self-pay | Admitting: Family Medicine

## 2011-01-22 NOTE — Telephone Encounter (Signed)
The medicine itself should not affect saliva production one way or the other. However if her blood level is off, this could. I suggest she come in for a TSH check to be sure (it is almost 6 months from her last check anyway)

## 2011-01-22 NOTE — Telephone Encounter (Signed)
Pt would like to know if thyroid medication is effecting the amount of siliva she is producing.  Pt does not feel she is producing enough.

## 2011-01-25 NOTE — Telephone Encounter (Signed)
patient  Is aware and will call back for a lab appointment

## 2011-02-02 ENCOUNTER — Other Ambulatory Visit: Payer: Self-pay | Admitting: Oncology

## 2011-02-02 ENCOUNTER — Encounter (HOSPITAL_BASED_OUTPATIENT_CLINIC_OR_DEPARTMENT_OTHER): Payer: BC Managed Care – PPO | Admitting: Oncology

## 2011-02-02 DIAGNOSIS — Z8673 Personal history of transient ischemic attack (TIA), and cerebral infarction without residual deficits: Secondary | ICD-10-CM

## 2011-02-02 DIAGNOSIS — D6859 Other primary thrombophilia: Secondary | ICD-10-CM

## 2011-02-02 DIAGNOSIS — Z7901 Long term (current) use of anticoagulants: Secondary | ICD-10-CM

## 2011-02-02 DIAGNOSIS — I6350 Cerebral infarction due to unspecified occlusion or stenosis of unspecified cerebral artery: Secondary | ICD-10-CM

## 2011-02-02 LAB — PROTIME-INR: Protime: 50.4 Seconds — ABNORMAL HIGH (ref 10.6–13.4)

## 2011-02-09 ENCOUNTER — Ambulatory Visit: Payer: Self-pay | Admitting: Physical Medicine & Rehabilitation

## 2011-02-12 ENCOUNTER — Encounter: Payer: Self-pay | Admitting: Family Medicine

## 2011-02-15 ENCOUNTER — Encounter: Payer: BC Managed Care – PPO | Attending: Physical Medicine & Rehabilitation

## 2011-02-15 ENCOUNTER — Ambulatory Visit: Payer: BC Managed Care – PPO | Admitting: Physical Medicine & Rehabilitation

## 2011-02-15 DIAGNOSIS — I6330 Cerebral infarction due to thrombosis of unspecified cerebral artery: Secondary | ICD-10-CM

## 2011-02-15 DIAGNOSIS — I69920 Aphasia following unspecified cerebrovascular disease: Secondary | ICD-10-CM | POA: Insufficient documentation

## 2011-02-15 DIAGNOSIS — D6859 Other primary thrombophilia: Secondary | ICD-10-CM | POA: Insufficient documentation

## 2011-02-15 DIAGNOSIS — I69959 Hemiplegia and hemiparesis following unspecified cerebrovascular disease affecting unspecified side: Secondary | ICD-10-CM | POA: Insufficient documentation

## 2011-02-15 DIAGNOSIS — G8110 Spastic hemiplegia affecting unspecified side: Secondary | ICD-10-CM

## 2011-02-18 ENCOUNTER — Encounter: Payer: Self-pay | Admitting: Family Medicine

## 2011-02-18 ENCOUNTER — Ambulatory Visit (INDEPENDENT_AMBULATORY_CARE_PROVIDER_SITE_OTHER): Payer: BC Managed Care – PPO | Admitting: Family Medicine

## 2011-02-18 VITALS — BP 110/78 | HR 60 | Ht 67.0 in | Wt 146.0 lb

## 2011-02-18 DIAGNOSIS — E039 Hypothyroidism, unspecified: Secondary | ICD-10-CM

## 2011-02-18 DIAGNOSIS — I6350 Cerebral infarction due to unspecified occlusion or stenosis of unspecified cerebral artery: Secondary | ICD-10-CM

## 2011-02-18 DIAGNOSIS — I639 Cerebral infarction, unspecified: Secondary | ICD-10-CM

## 2011-02-18 LAB — BASIC METABOLIC PANEL
Calcium: 9 mg/dL (ref 8.4–10.5)
GFR: 72.27 mL/min (ref >60.00)
Sodium: 140 mEq/L (ref 135–145)

## 2011-02-18 LAB — CBC WITH DIFFERENTIAL/PLATELET
Basophils Relative: 0.8 % (ref 0.0–3.0)
Eosinophils Relative: 11.9 % — ABNORMAL HIGH (ref 0.0–5.0)
Lymphocytes Relative: 31.5 % (ref 12.0–46.0)
Neutrophils Relative %: 48.7 % (ref 43.0–77.0)
RBC: 3.99 Mil/uL (ref 3.87–5.11)
WBC: 4.9 10*3/uL (ref 4.5–10.5)

## 2011-02-18 NOTE — Progress Notes (Signed)
  Subjective:    Patient ID: Natalie Mullen, female    DOB: 10-17-1966, 45 y.o.   MRN: [id number]  HPI Here with her husband to follow up on hypothyroidism primarily. She is S/P a stroke last July and had IV and IA TPA which was very successful. She has totally recovered from this, and she feels great. She plays tennis 3 days a week. She sees Dr. Cyndie Chime regularly to monitor her Coumadin levels. She does complain today of a dry mouth for several months. She has also gained some weight.    Review of Systems  Constitutional: Positive for unexpected weight change.  HENT: Negative.   Respiratory: Negative.   Cardiovascular: Negative.   Skin: Negative.   Neurological: Negative.        Objective:   Physical Exam  Constitutional: She is oriented to person, place, and time. She appears well-developed and well-nourished.  HENT:  Mouth/Throat: Oropharynx is clear and moist. No oropharyngeal exudate.  Neck: No thyromegaly present.  Cardiovascular: Normal rate, regular rhythm, normal heart sounds and intact distal pulses.   Pulmonary/Chest: Effort normal and breath sounds normal.  Neurological: She is alert and oriented to person, place, and time. She has normal reflexes. No cranial nerve deficit. She exhibits normal muscle tone. Coordination normal.  Skin: Skin is warm and dry.          Assessment & Plan:  Her dry mouth and weight gain make me think her Synthroid dose is too low. Check labs today, and we will go from there.

## 2011-02-19 ENCOUNTER — Telehealth: Payer: Self-pay

## 2011-02-19 NOTE — Telephone Encounter (Signed)
Message copied by Madison Hickman on Fri Feb 19, 2011  9:29 AM ------      Message from: Dwaine Deter      Created: Fri Feb 19, 2011  8:47 AM       All normal, including the thyroid level. Recheck a TSH in 6 months

## 2011-02-19 NOTE — Telephone Encounter (Signed)
Left mess regarding results and call back if questions

## 2011-03-15 ENCOUNTER — Other Ambulatory Visit: Payer: Self-pay | Admitting: Oncology

## 2011-03-15 ENCOUNTER — Encounter (HOSPITAL_BASED_OUTPATIENT_CLINIC_OR_DEPARTMENT_OTHER): Payer: BC Managed Care – PPO | Admitting: Oncology

## 2011-03-15 DIAGNOSIS — I6350 Cerebral infarction due to unspecified occlusion or stenosis of unspecified cerebral artery: Secondary | ICD-10-CM

## 2011-03-15 DIAGNOSIS — Z7901 Long term (current) use of anticoagulants: Secondary | ICD-10-CM

## 2011-03-15 DIAGNOSIS — D6859 Other primary thrombophilia: Secondary | ICD-10-CM

## 2011-03-15 DIAGNOSIS — Z8673 Personal history of transient ischemic attack (TIA), and cerebral infarction without residual deficits: Secondary | ICD-10-CM

## 2011-03-15 LAB — CBC WITH DIFFERENTIAL/PLATELET
Basophils Absolute: 0 10*3/uL (ref 0.0–0.1)
EOS%: 4.9 % (ref 0.0–7.0)
HGB: 13.1 g/dL (ref 11.6–15.9)
MCH: 29.8 pg (ref 25.1–34.0)
NEUT#: 3.3 10*3/uL (ref 1.5–6.5)
RDW: 13.6 % (ref 11.2–14.5)
lymph#: 1.6 10*3/uL (ref 0.9–3.3)

## 2011-03-15 LAB — PROTIME-INR: INR: 3.4 (ref 2.00–3.50)

## 2011-03-23 LAB — CHROMOGENIC FACTOR X: CHROM XA: 30.8 % — ABNORMAL LOW (ref 86–146)

## 2011-04-01 ENCOUNTER — Other Ambulatory Visit: Payer: Self-pay | Admitting: Oncology

## 2011-04-01 ENCOUNTER — Encounter (HOSPITAL_BASED_OUTPATIENT_CLINIC_OR_DEPARTMENT_OTHER): Payer: BC Managed Care – PPO | Admitting: Oncology

## 2011-04-01 DIAGNOSIS — Z8673 Personal history of transient ischemic attack (TIA), and cerebral infarction without residual deficits: Secondary | ICD-10-CM

## 2011-04-01 DIAGNOSIS — D6859 Other primary thrombophilia: Secondary | ICD-10-CM

## 2011-04-01 DIAGNOSIS — Z7901 Long term (current) use of anticoagulants: Secondary | ICD-10-CM

## 2011-04-01 LAB — PROTIME-INR
INR: 3.8 — ABNORMAL HIGH (ref 2.00–3.50)
Protime: 45.6 Seconds — ABNORMAL HIGH (ref 10.6–13.4)

## 2011-04-09 ENCOUNTER — Other Ambulatory Visit: Payer: Self-pay | Admitting: Oncology

## 2011-04-09 ENCOUNTER — Encounter (HOSPITAL_BASED_OUTPATIENT_CLINIC_OR_DEPARTMENT_OTHER): Payer: BC Managed Care – PPO | Admitting: Oncology

## 2011-04-09 DIAGNOSIS — Z8673 Personal history of transient ischemic attack (TIA), and cerebral infarction without residual deficits: Secondary | ICD-10-CM

## 2011-04-09 DIAGNOSIS — Z7901 Long term (current) use of anticoagulants: Secondary | ICD-10-CM

## 2011-04-09 DIAGNOSIS — D6859 Other primary thrombophilia: Secondary | ICD-10-CM

## 2011-04-09 LAB — CHROMOGENIC FACTOR X: CHROM XA: 21.3 % — ABNORMAL LOW (ref 86–146)

## 2011-04-09 LAB — PROTHROMBIN TIME: INR: 3.6 — ABNORMAL HIGH (ref <1.50)

## 2011-04-15 ENCOUNTER — Encounter (HOSPITAL_BASED_OUTPATIENT_CLINIC_OR_DEPARTMENT_OTHER): Payer: BC Managed Care – PPO | Admitting: Oncology

## 2011-04-15 ENCOUNTER — Other Ambulatory Visit: Payer: Self-pay | Admitting: Oncology

## 2011-04-15 DIAGNOSIS — Z8673 Personal history of transient ischemic attack (TIA), and cerebral infarction without residual deficits: Secondary | ICD-10-CM

## 2011-04-15 DIAGNOSIS — D6859 Other primary thrombophilia: Secondary | ICD-10-CM

## 2011-04-15 DIAGNOSIS — Z7901 Long term (current) use of anticoagulants: Secondary | ICD-10-CM

## 2011-04-15 LAB — PROTIME-INR: INR: 3.6 — ABNORMAL HIGH (ref 2.00–3.50)

## 2011-04-20 ENCOUNTER — Encounter (HOSPITAL_BASED_OUTPATIENT_CLINIC_OR_DEPARTMENT_OTHER): Payer: BC Managed Care – PPO | Admitting: Oncology

## 2011-04-20 ENCOUNTER — Other Ambulatory Visit: Payer: Self-pay | Admitting: Oncology

## 2011-04-20 DIAGNOSIS — D6859 Other primary thrombophilia: Secondary | ICD-10-CM

## 2011-04-20 DIAGNOSIS — Z8673 Personal history of transient ischemic attack (TIA), and cerebral infarction without residual deficits: Secondary | ICD-10-CM

## 2011-04-20 DIAGNOSIS — Z7901 Long term (current) use of anticoagulants: Secondary | ICD-10-CM

## 2011-04-20 LAB — PROTIME-INR: Protime: 57.6 Seconds — ABNORMAL HIGH (ref 10.6–13.4)

## 2011-05-04 ENCOUNTER — Other Ambulatory Visit: Payer: Self-pay | Admitting: Oncology

## 2011-05-04 ENCOUNTER — Encounter (HOSPITAL_BASED_OUTPATIENT_CLINIC_OR_DEPARTMENT_OTHER): Payer: BC Managed Care – PPO | Admitting: Oncology

## 2011-05-04 DIAGNOSIS — Z8673 Personal history of transient ischemic attack (TIA), and cerebral infarction without residual deficits: Secondary | ICD-10-CM

## 2011-05-04 DIAGNOSIS — D6859 Other primary thrombophilia: Secondary | ICD-10-CM

## 2011-05-04 DIAGNOSIS — Z7901 Long term (current) use of anticoagulants: Secondary | ICD-10-CM

## 2011-05-04 LAB — PROTHROMBIN TIME: Prothrombin Time: 40.7 seconds — ABNORMAL HIGH (ref 11.6–15.2)

## 2011-05-04 LAB — PROTIME-INR

## 2011-05-06 LAB — CHROMOGENIC FACTOR X: CHROM XA: 22.3 % — ABNORMAL LOW (ref 86–146)

## 2011-05-21 ENCOUNTER — Other Ambulatory Visit: Payer: Self-pay | Admitting: Oncology

## 2011-05-21 ENCOUNTER — Encounter (HOSPITAL_BASED_OUTPATIENT_CLINIC_OR_DEPARTMENT_OTHER): Payer: BC Managed Care – PPO | Admitting: Oncology

## 2011-05-21 DIAGNOSIS — D6859 Other primary thrombophilia: Secondary | ICD-10-CM

## 2011-05-21 DIAGNOSIS — Z7901 Long term (current) use of anticoagulants: Secondary | ICD-10-CM

## 2011-05-21 DIAGNOSIS — Z8673 Personal history of transient ischemic attack (TIA), and cerebral infarction without residual deficits: Secondary | ICD-10-CM

## 2011-05-21 LAB — CBC WITH DIFFERENTIAL/PLATELET
BASO%: 1.1 % (ref 0.0–2.0)
EOS%: 8.4 % — ABNORMAL HIGH (ref 0.0–7.0)
HCT: 34.5 % — ABNORMAL LOW (ref 34.8–46.6)
LYMPH%: 26.2 % (ref 14.0–49.7)
MCH: 30 pg (ref 25.1–34.0)
MCHC: 34.8 g/dL (ref 31.5–36.0)
MCV: 86.1 fL (ref 79.5–101.0)
NEUT%: 58.2 % (ref 38.4–76.8)
Platelets: 262 10*3/uL (ref 145–400)

## 2011-05-21 LAB — PROTIME-INR

## 2011-05-21 LAB — URINALYSIS, MICROSCOPIC - CHCC
Nitrite: NEGATIVE
pH: 5 (ref 4.6–8.0)

## 2011-05-22 LAB — URINE CULTURE

## 2011-05-25 LAB — CHROMOGENIC FACTOR X: CHROM XA: 20.2 % — ABNORMAL LOW (ref 86–146)

## 2011-06-01 ENCOUNTER — Other Ambulatory Visit: Payer: Self-pay | Admitting: Oncology

## 2011-06-01 ENCOUNTER — Ambulatory Visit (INDEPENDENT_AMBULATORY_CARE_PROVIDER_SITE_OTHER): Payer: BC Managed Care – PPO | Admitting: Internal Medicine

## 2011-06-01 ENCOUNTER — Encounter: Payer: Self-pay | Admitting: Internal Medicine

## 2011-06-01 ENCOUNTER — Encounter (HOSPITAL_BASED_OUTPATIENT_CLINIC_OR_DEPARTMENT_OTHER): Payer: BC Managed Care – PPO | Admitting: Oncology

## 2011-06-01 DIAGNOSIS — Z7901 Long term (current) use of anticoagulants: Secondary | ICD-10-CM

## 2011-06-01 DIAGNOSIS — R310 Gross hematuria: Secondary | ICD-10-CM

## 2011-06-01 DIAGNOSIS — Z8673 Personal history of transient ischemic attack (TIA), and cerebral infarction without residual deficits: Secondary | ICD-10-CM

## 2011-06-01 DIAGNOSIS — N1330 Unspecified hydronephrosis: Secondary | ICD-10-CM

## 2011-06-01 DIAGNOSIS — D6859 Other primary thrombophilia: Secondary | ICD-10-CM

## 2011-06-01 DIAGNOSIS — R319 Hematuria, unspecified: Secondary | ICD-10-CM

## 2011-06-01 LAB — POCT URINALYSIS DIPSTICK
Leukocytes, UA: NEGATIVE
pH, UA: 6

## 2011-06-01 LAB — CBC WITH DIFFERENTIAL/PLATELET
Basophils Absolute: 0 10*3/uL (ref 0.0–0.1)
Basophils Absolute: 0 10*3/uL (ref 0.0–0.1)
Basophils Relative: 0.8 % (ref 0.0–3.0)
EOS%: 12.9 % — ABNORMAL HIGH (ref 0.0–7.0)
HCT: 33.6 % — ABNORMAL LOW (ref 34.8–46.6)
HCT: 35.3 % — ABNORMAL LOW (ref 36.0–46.0)
HGB: 11.5 g/dL — ABNORMAL LOW (ref 11.6–15.9)
Hemoglobin: 12.1 g/dL (ref 12.0–15.0)
Lymphs Abs: 1.6 10*3/uL (ref 0.7–4.0)
MCH: 29.5 pg (ref 25.1–34.0)
MCV: 86.4 fL (ref 79.5–101.0)
MONO%: 9.2 % (ref 0.0–14.0)
Monocytes Relative: 8.2 % (ref 3.0–12.0)
NEUT%: 41.7 % (ref 38.4–76.8)
Neutro Abs: 2.3 10*3/uL (ref 1.4–7.7)
RDW: 14 % (ref 11.5–14.6)

## 2011-06-01 LAB — BASIC METABOLIC PANEL
Chloride: 104 mEq/L (ref 96–112)
GFR: 70.36 mL/min (ref >60.00)

## 2011-06-01 NOTE — Assessment & Plan Note (Signed)
Intermittent gross hematuria with possible hydronephrosis without known stone. Complicated by chronic anticoagulation. Obtain CBC, chem7. Obtain record release for outside ED visit and CT scan results. Urology consult recommended and urology has graciously agreed to see the patient today.

## 2011-06-01 NOTE — Progress Notes (Signed)
  Subjective:    Patient ID: Natalie Mullen, female    DOB: 10-Mar-1966, 45 y.o.   MRN: [id number]  HPI Pt presents to clinic for evaluation of gross hematuria. Notes ~1.5 wks ago initial episode of gross hematuria without pain. UA 6/29 confirmed RBC TNTC and urine culture failed to demonstrate any growth. Approximately 4 days later while traveling through [place] had additional episode of hematuria associated with right low flank/back pain. Presented to ED and underwent CT imaging. No records or report available currently but description appears to suggest unilateral hydronephrosis without obvious stone or mass. Pain resolved and has not returned however developed subsequent episode of gross hematuria again this morning. UA today demonstrates +3 blood. Notes no difficulty with urination.  Chart review indicates history anticardiolipin antibody followed by hematology maintained on coumadin. INR 7/10 3.31 and 6/29 3.51. No other gross active bleeding.   Reviewed pmh, medications and allergies.     Review of Systems  Constitutional: Negative for fever and chills.  Gastrointestinal: Negative for abdominal pain.  Genitourinary: Positive for hematuria and flank pain. Negative for decreased urine volume, difficulty urinating and pelvic pain.  Hematological: Does not bruise/bleed easily.       Objective:   Physical Exam  Nursing note and vitals reviewed. Constitutional: She appears well-developed and well-nourished. No distress.  Neurological: She is alert.  Skin: She is not diaphoretic.  Psychiatric: She has a normal mood and affect.          Assessment & Plan:

## 2011-06-02 ENCOUNTER — Telehealth: Payer: Self-pay

## 2011-06-02 LAB — CHROMOGENIC FACTOR X: CHROM XA: 21.4 % — ABNORMAL LOW (ref 86–146)

## 2011-06-02 NOTE — Telephone Encounter (Signed)
Message copied by Beverely Low on Wed Jun 02, 2011  2:00 PM ------      Message from: Staci Righter      Created: Tue Jun 01, 2011  4:15 PM       Cbc and chem7 nl. pls fax to urology

## 2011-06-02 NOTE — Telephone Encounter (Signed)
Pt's husband notified.

## 2011-06-14 ENCOUNTER — Other Ambulatory Visit: Payer: Self-pay | Admitting: Oncology

## 2011-06-14 ENCOUNTER — Encounter (HOSPITAL_BASED_OUTPATIENT_CLINIC_OR_DEPARTMENT_OTHER): Payer: BC Managed Care – PPO | Admitting: Oncology

## 2011-06-14 DIAGNOSIS — Z7901 Long term (current) use of anticoagulants: Secondary | ICD-10-CM

## 2011-06-14 DIAGNOSIS — D6859 Other primary thrombophilia: Secondary | ICD-10-CM

## 2011-06-14 DIAGNOSIS — Z8673 Personal history of transient ischemic attack (TIA), and cerebral infarction without residual deficits: Secondary | ICD-10-CM

## 2011-06-14 LAB — PROTIME-INR

## 2011-06-28 ENCOUNTER — Other Ambulatory Visit: Payer: Self-pay | Admitting: Oncology

## 2011-06-28 ENCOUNTER — Encounter (HOSPITAL_BASED_OUTPATIENT_CLINIC_OR_DEPARTMENT_OTHER): Payer: BC Managed Care – PPO | Admitting: Oncology

## 2011-06-28 DIAGNOSIS — Z8673 Personal history of transient ischemic attack (TIA), and cerebral infarction without residual deficits: Secondary | ICD-10-CM

## 2011-06-28 DIAGNOSIS — D6859 Other primary thrombophilia: Secondary | ICD-10-CM

## 2011-06-28 DIAGNOSIS — Z7901 Long term (current) use of anticoagulants: Secondary | ICD-10-CM

## 2011-06-28 LAB — PROTIME-INR: INR: 4.7 — ABNORMAL HIGH (ref 2.00–3.50)

## 2011-06-28 LAB — CBC WITH DIFFERENTIAL/PLATELET
Basophils Absolute: 0 10*3/uL (ref 0.0–0.1)
Eosinophils Absolute: 0.5 10*3/uL (ref 0.0–0.5)
HGB: 11.8 g/dL (ref 11.6–15.9)
MCV: 84.2 fL (ref 79.5–101.0)
MONO%: 7.6 % (ref 0.0–14.0)
NEUT#: 2.8 10*3/uL (ref 1.5–6.5)
RDW: 13.9 % (ref 11.2–14.5)

## 2011-06-29 LAB — CHROMOGENIC FACTOR X: CHROM XA: 25.4 % — ABNORMAL LOW (ref 86–146)

## 2011-07-12 ENCOUNTER — Encounter (HOSPITAL_BASED_OUTPATIENT_CLINIC_OR_DEPARTMENT_OTHER): Payer: BC Managed Care – PPO | Admitting: Oncology

## 2011-07-12 ENCOUNTER — Other Ambulatory Visit: Payer: Self-pay | Admitting: Oncology

## 2011-07-12 DIAGNOSIS — Z7901 Long term (current) use of anticoagulants: Secondary | ICD-10-CM

## 2011-07-12 DIAGNOSIS — Z8673 Personal history of transient ischemic attack (TIA), and cerebral infarction without residual deficits: Secondary | ICD-10-CM

## 2011-07-12 DIAGNOSIS — D6859 Other primary thrombophilia: Secondary | ICD-10-CM

## 2011-07-28 ENCOUNTER — Encounter: Payer: Self-pay | Admitting: Gynecology

## 2011-07-28 ENCOUNTER — Ambulatory Visit (INDEPENDENT_AMBULATORY_CARE_PROVIDER_SITE_OTHER): Payer: BC Managed Care – PPO | Admitting: Gynecology

## 2011-07-28 ENCOUNTER — Other Ambulatory Visit (HOSPITAL_COMMUNITY)
Admission: RE | Admit: 2011-07-28 | Discharge: 2011-07-28 | Disposition: A | Discharge status: Home / Self Care | Payer: BC Managed Care – PPO | Source: Ambulatory Visit | Attending: Gynecology | Admitting: Gynecology

## 2011-07-28 VITALS — BP 100/70 | Ht 66.75 in | Wt 142.0 lb

## 2011-07-28 DIAGNOSIS — Z13220 Encounter for screening for lipoid disorders: Secondary | ICD-10-CM

## 2011-07-28 DIAGNOSIS — Z8673 Personal history of transient ischemic attack (TIA), and cerebral infarction without residual deficits: Secondary | ICD-10-CM | POA: Insufficient documentation

## 2011-07-28 DIAGNOSIS — R760 Raised antibody titer: Secondary | ICD-10-CM | POA: Insufficient documentation

## 2011-07-28 DIAGNOSIS — Z01419 Encounter for gynecological examination (general) (routine) without abnormal findings: Secondary | ICD-10-CM | POA: Insufficient documentation

## 2011-07-28 DIAGNOSIS — Z131 Encounter for screening for diabetes mellitus: Secondary | ICD-10-CM

## 2011-07-28 DIAGNOSIS — Z309 Encounter for contraceptive management, unspecified: Secondary | ICD-10-CM

## 2011-07-28 NOTE — Progress Notes (Signed)
Natalie Mullen 28-Jul-1966 [id number]        45 y.o.  New patient for annual exam.  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 She's being followed by Dr. Marlena Clipper and is on Coumadin. She notes her periods are fairly heavy they come monthly. They're using barrier contraception.  Past medical history,surgical history, medications, allergies, family history and social history were all reviewed and documented in the EPIC chart. ROS:  Was performed and pertinent positives and negatives are included in the history.  Exam: chaperone present Filed Vitals:   07/28/11 1034  BP: 100/70   General appearance  Normal Skin grossly normal Head/Neck normal with no cervical or supraclavicular adenopathy thyroid normal Lungs  clear Cardiac RR, without RMG Abdominal  soft, nontender, without masses, organomegaly or hernia Breasts  examined lying and sitting without masses, retractions, discharge or axillary adenopathy. Pelvic  Ext/BUS/vagina  normal   Cervix  normal  Pap done  Uterus  anteverted, normal size, shape and contour, midline and mobile nontender   Adnexa  Without masses or tenderness    Anus and perineum  normal   Rectovaginal  normal sphincter tone without palpated masses or tenderness.    Assessment/Plan:  45 y.o. female for annual exam.    #1 History of stroke. Currently on Coumadin. Regular menses although a little bit heavier. Using barrier contraception. Discussed failure risks with. Contraception. We discussed the issues of pregnancy at age 72 although I think more difficult certainly possible. Options for contraception were reviewed. I think estrogen-containing is contraindicated. I think progesterone containing possible such as Mirena IUD which would address both the heavy periods and contraception. Alternatives such as Depo-Provera, Implanon  sterilization, including tubal, vasectomy and essure were discussed. Patient is not sure.  I gave her information on the Mirena and strongly urged her to consider this and she has an appointment to see Dr. Cyndie Chime in October and she can discuss this with him also to make sure he is comfortable with this. #2 Health maintenance. Self breast exams on a monthly basis discussed encouraged. She has not had a mammogram in several years I strongly urged her to schedule this she agrees to do so. She is being followed for hypothyroidism by Dr. Clent Ridges. She does not think though that she has had a lipid profile done I ordered a lipid profile and a glucose along with urinalysis. Her recent CBC by Dr. Cyndie Chime showed a hemoglobin of 11.8 and I recommended that she take extra iron with a multivitamin she agrees to do so. Assuming she continues well from a gynecologic standpoint she will followup in a year sooner for contraceptive management as noted above. If she does decide to continue barrier contraception she clearly understands the failure risks associated with it.    Dara Lords MD, 11:20 AM 07/28/2011

## 2011-08-02 ENCOUNTER — Other Ambulatory Visit: Payer: Self-pay | Admitting: Oncology

## 2011-08-02 ENCOUNTER — Encounter (HOSPITAL_BASED_OUTPATIENT_CLINIC_OR_DEPARTMENT_OTHER): Payer: BC Managed Care – PPO | Admitting: Oncology

## 2011-08-02 DIAGNOSIS — D6859 Other primary thrombophilia: Secondary | ICD-10-CM

## 2011-08-02 DIAGNOSIS — Z7901 Long term (current) use of anticoagulants: Secondary | ICD-10-CM

## 2011-08-03 LAB — CHROMOGENIC FACTOR X: CHROM XA: 32.2 % — ABNORMAL LOW (ref 86–146)

## 2011-08-30 ENCOUNTER — Other Ambulatory Visit: Payer: Self-pay | Admitting: Oncology

## 2011-08-30 ENCOUNTER — Encounter (HOSPITAL_BASED_OUTPATIENT_CLINIC_OR_DEPARTMENT_OTHER): Payer: BC Managed Care – PPO | Admitting: Oncology

## 2011-08-30 DIAGNOSIS — D6859 Other primary thrombophilia: Secondary | ICD-10-CM

## 2011-08-30 DIAGNOSIS — I6350 Cerebral infarction due to unspecified occlusion or stenosis of unspecified cerebral artery: Secondary | ICD-10-CM

## 2011-08-30 DIAGNOSIS — Z7901 Long term (current) use of anticoagulants: Secondary | ICD-10-CM

## 2011-08-31 LAB — CHROMOGENIC FACTOR X: CHROM XA: 19.5 % — ABNORMAL LOW (ref 86–146)

## 2011-09-17 ENCOUNTER — Other Ambulatory Visit: Payer: Self-pay | Admitting: Oncology

## 2011-09-17 ENCOUNTER — Encounter (HOSPITAL_BASED_OUTPATIENT_CLINIC_OR_DEPARTMENT_OTHER): Payer: BC Managed Care – PPO | Admitting: Oncology

## 2011-09-17 DIAGNOSIS — I6350 Cerebral infarction due to unspecified occlusion or stenosis of unspecified cerebral artery: Secondary | ICD-10-CM

## 2011-09-17 DIAGNOSIS — Z7901 Long term (current) use of anticoagulants: Secondary | ICD-10-CM

## 2011-09-17 DIAGNOSIS — D6859 Other primary thrombophilia: Secondary | ICD-10-CM

## 2011-09-17 DIAGNOSIS — Z8673 Personal history of transient ischemic attack (TIA), and cerebral infarction without residual deficits: Secondary | ICD-10-CM

## 2011-09-17 LAB — COMPREHENSIVE METABOLIC PANEL
ALT: 17 U/L (ref 0–35)
AST: 19 U/L (ref 0–37)
Calcium: 8.8 mg/dL (ref 8.4–10.5)
Chloride: 107 mEq/L (ref 96–112)
Creatinine, Ser: 0.81 mg/dL (ref 0.50–1.10)
Sodium: 137 mEq/L (ref 135–145)
Total Bilirubin: 0.5 mg/dL (ref 0.3–1.2)
Total Protein: 6.8 g/dL (ref 6.0–8.3)

## 2011-09-17 LAB — CBC WITH DIFFERENTIAL/PLATELET
BASO%: 0.6 % (ref 0.0–2.0)
EOS%: 6.3 % (ref 0.0–7.0)
HCT: 32.7 % — ABNORMAL LOW (ref 34.8–46.6)
LYMPH%: 32.9 % (ref 14.0–49.7)
MCH: 27.4 pg (ref 25.1–34.0)
MCHC: 33.7 g/dL (ref 31.5–36.0)
NEUT%: 54 % (ref 38.4–76.8)
Platelets: 234 10*3/uL (ref 145–400)
RBC: 4.02 10*6/uL (ref 3.70–5.45)
lymph#: 1.6 10*3/uL (ref 0.9–3.3)

## 2011-09-17 LAB — PROTIME-INR: Protime: 48 Seconds — ABNORMAL HIGH (ref 10.6–13.4)

## 2011-09-18 ENCOUNTER — Other Ambulatory Visit: Payer: Self-pay | Admitting: Family Medicine

## 2011-09-21 NOTE — Telephone Encounter (Signed)
Script sent e-scribe 

## 2011-09-22 ENCOUNTER — Encounter: Payer: Self-pay | Admitting: Family Medicine

## 2011-09-22 ENCOUNTER — Ambulatory Visit (INDEPENDENT_AMBULATORY_CARE_PROVIDER_SITE_OTHER): Payer: BC Managed Care – PPO | Admitting: Family Medicine

## 2011-09-22 VITALS — BP 112/60 | HR 58 | Temp 98.3°F | Wt 149.0 lb

## 2011-09-22 DIAGNOSIS — D6861 Antiphospholipid syndrome: Secondary | ICD-10-CM

## 2011-09-22 DIAGNOSIS — Z8673 Personal history of transient ischemic attack (TIA), and cerebral infarction without residual deficits: Secondary | ICD-10-CM

## 2011-09-22 DIAGNOSIS — D6859 Other primary thrombophilia: Secondary | ICD-10-CM

## 2011-09-22 DIAGNOSIS — E039 Hypothyroidism, unspecified: Secondary | ICD-10-CM

## 2011-09-22 NOTE — Progress Notes (Signed)
  Subjective:    Patient ID: Natalie Mullen, female    DOB: 11-18-1966, 45 y.o.   MRN: [id number]  HPI Here to follow up on hypothyroidism and s/p stroke. She feels great, except she has gained a little weight and can't explain why. Her diet is the same, and she still gets exercise. She still sees Dr. Cyndie Chime for her anti-phospholipid syndrome.    Review of Systems  Constitutional: Positive for unexpected weight change. Negative for activity change, appetite change and fatigue.  Respiratory: Negative.   Cardiovascular: Negative.   Neurological: Negative.        Objective:   Physical Exam  Constitutional: She is oriented to person, place, and time. She appears well-developed and well-nourished.  Neck: Neck supple. No thyromegaly present.  Cardiovascular: Normal rate, regular rhythm, normal heart sounds and intact distal pulses.   Pulmonary/Chest: Effort normal and breath sounds normal.  Lymphadenopathy:    She has no cervical adenopathy.  Neurological: She is alert and oriented to person, place, and time. She has normal reflexes. No cranial nerve deficit. She exhibits normal muscle tone. Coordination normal.          Assessment & Plan:  The weight gain could be related to her thyroid levels, so we will check a TSH today. Otherwise she seems to be doing well.

## 2011-09-27 ENCOUNTER — Telehealth: Payer: Self-pay | Admitting: Family Medicine

## 2011-09-27 NOTE — Telephone Encounter (Signed)
Message copied by Baldemar Friday on Mon Sep 27, 2011  9:41 AM ------      Message from: Gershon Crane A      Created: Thu Sep 23, 2011  3:54 PM       normal

## 2011-09-27 NOTE — Telephone Encounter (Signed)
Spoke with pt and gave results. 

## 2011-09-28 DIAGNOSIS — I639 Cerebral infarction, unspecified: Secondary | ICD-10-CM | POA: Insufficient documentation

## 2011-09-28 DIAGNOSIS — R760 Raised antibody titer: Secondary | ICD-10-CM | POA: Insufficient documentation

## 2011-10-01 ENCOUNTER — Telehealth: Payer: Self-pay | Admitting: *Deleted

## 2011-10-01 NOTE — Telephone Encounter (Signed)
Pt. notifed to stay on same dose of coumadin per Dr. Patsy Lager note re labs done 09/17/11 based on simultaneous Xa level of 26%.  Copy of this note will be faxed to Dr. Gershon Crane.

## 2011-10-07 ENCOUNTER — Telehealth: Payer: Self-pay | Admitting: Pharmacist

## 2011-10-07 NOTE — Telephone Encounter (Signed)
Pt has had her period for 2 weeks.  She states flow is normal but the duration overall has her concerned. She requests a visit w/ Coumadin Clinic tomorrow.

## 2011-10-07 NOTE — Telephone Encounter (Signed)
Noted - check CBC & Xa - stat to [facility]

## 2011-10-08 ENCOUNTER — Other Ambulatory Visit: Payer: Self-pay | Admitting: Pharmacist

## 2011-10-08 ENCOUNTER — Ambulatory Visit: Payer: Self-pay | Admitting: Oncology

## 2011-10-08 ENCOUNTER — Other Ambulatory Visit (HOSPITAL_BASED_OUTPATIENT_CLINIC_OR_DEPARTMENT_OTHER): Payer: BC Managed Care – PPO

## 2011-10-08 ENCOUNTER — Ambulatory Visit: Payer: BC Managed Care – PPO

## 2011-10-08 DIAGNOSIS — R760 Raised antibody titer: Secondary | ICD-10-CM

## 2011-10-08 DIAGNOSIS — I6350 Cerebral infarction due to unspecified occlusion or stenosis of unspecified cerebral artery: Secondary | ICD-10-CM

## 2011-10-08 DIAGNOSIS — D6861 Antiphospholipid syndrome: Secondary | ICD-10-CM

## 2011-10-08 DIAGNOSIS — D6859 Other primary thrombophilia: Secondary | ICD-10-CM

## 2011-10-08 DIAGNOSIS — I639 Cerebral infarction, unspecified: Secondary | ICD-10-CM

## 2011-10-15 ENCOUNTER — Ambulatory Visit: Payer: BC Managed Care – PPO

## 2011-10-15 ENCOUNTER — Other Ambulatory Visit: Payer: BC Managed Care – PPO | Admitting: Lab

## 2011-10-19 ENCOUNTER — Telehealth: Payer: Self-pay | Admitting: Pharmacist

## 2011-10-20 ENCOUNTER — Telehealth: Payer: Self-pay | Admitting: Pharmacist

## 2011-10-20 ENCOUNTER — Other Ambulatory Visit: Payer: Self-pay | Admitting: *Deleted

## 2011-10-20 DIAGNOSIS — R894 Abnormal immunological findings in specimens from other organs, systems and tissues: Secondary | ICD-10-CM

## 2011-10-21 ENCOUNTER — Ambulatory Visit: Payer: BC Managed Care – PPO

## 2011-10-21 ENCOUNTER — Other Ambulatory Visit (HOSPITAL_BASED_OUTPATIENT_CLINIC_OR_DEPARTMENT_OTHER): Payer: BC Managed Care – PPO

## 2011-10-21 DIAGNOSIS — D6859 Other primary thrombophilia: Secondary | ICD-10-CM

## 2011-10-21 DIAGNOSIS — D6861 Antiphospholipid syndrome: Secondary | ICD-10-CM

## 2011-10-21 DIAGNOSIS — I6350 Cerebral infarction due to unspecified occlusion or stenosis of unspecified cerebral artery: Secondary | ICD-10-CM

## 2011-10-21 DIAGNOSIS — R760 Raised antibody titer: Secondary | ICD-10-CM

## 2011-10-21 DIAGNOSIS — I639 Cerebral infarction, unspecified: Secondary | ICD-10-CM

## 2011-10-23 ENCOUNTER — Other Ambulatory Visit: Payer: Self-pay | Admitting: Oncology

## 2011-10-23 DIAGNOSIS — D689 Coagulation defect, unspecified: Secondary | ICD-10-CM

## 2011-10-23 DIAGNOSIS — I63411 Cerebral infarction due to embolism of right middle cerebral artery: Secondary | ICD-10-CM

## 2011-10-23 DIAGNOSIS — D6869 Other thrombophilia: Secondary | ICD-10-CM

## 2011-10-25 ENCOUNTER — Other Ambulatory Visit: Payer: Self-pay | Admitting: Oncology

## 2011-10-25 NOTE — Telephone Encounter (Signed)
Called in refill for Coumadin 4mg  tablets #90 RF:1 MD: Cyndie Chime

## 2011-11-24 ENCOUNTER — Other Ambulatory Visit: Payer: BC Managed Care – PPO | Admitting: Lab

## 2011-11-24 ENCOUNTER — Ambulatory Visit (HOSPITAL_BASED_OUTPATIENT_CLINIC_OR_DEPARTMENT_OTHER): Payer: Self-pay | Admitting: Oncology

## 2011-11-24 ENCOUNTER — Other Ambulatory Visit: Payer: Self-pay | Admitting: Oncology

## 2011-11-24 ENCOUNTER — Ambulatory Visit: Payer: BC Managed Care – PPO

## 2011-11-24 DIAGNOSIS — I6350 Cerebral infarction due to unspecified occlusion or stenosis of unspecified cerebral artery: Secondary | ICD-10-CM

## 2011-11-24 DIAGNOSIS — R894 Abnormal immunological findings in specimens from other organs, systems and tissues: Secondary | ICD-10-CM

## 2011-11-24 DIAGNOSIS — I639 Cerebral infarction, unspecified: Secondary | ICD-10-CM

## 2011-11-24 DIAGNOSIS — R760 Raised antibody titer: Secondary | ICD-10-CM

## 2011-11-24 LAB — POCT INR: INR: 0

## 2011-11-24 NOTE — Progress Notes (Signed)
No major complaints re: anticoag. Chrom Xa level = 35.1 (goal = 25-35%). I will leave pt on same dose. Repeat protime in 1 month (12/27/11) Marily Lente, Pharm.D.

## 2011-11-25 ENCOUNTER — Other Ambulatory Visit: Payer: Self-pay | Admitting: Pharmacist

## 2011-11-25 DIAGNOSIS — I639 Cerebral infarction, unspecified: Secondary | ICD-10-CM

## 2011-11-25 DIAGNOSIS — D6861 Antiphospholipid syndrome: Secondary | ICD-10-CM

## 2011-12-27 ENCOUNTER — Ambulatory Visit: Payer: BC Managed Care – PPO

## 2011-12-27 ENCOUNTER — Other Ambulatory Visit (HOSPITAL_BASED_OUTPATIENT_CLINIC_OR_DEPARTMENT_OTHER): Payer: BC Managed Care – PPO | Admitting: Lab

## 2011-12-27 DIAGNOSIS — I6350 Cerebral infarction due to unspecified occlusion or stenosis of unspecified cerebral artery: Secondary | ICD-10-CM

## 2011-12-27 DIAGNOSIS — D6861 Antiphospholipid syndrome: Secondary | ICD-10-CM

## 2011-12-28 LAB — CHROMOGENIC FACTOR X: CHROM XA: 43.4 % — ABNORMAL LOW (ref 86–146)

## 2011-12-29 ENCOUNTER — Ambulatory Visit: Payer: Self-pay | Admitting: Oncology

## 2011-12-29 DIAGNOSIS — I639 Cerebral infarction, unspecified: Secondary | ICD-10-CM

## 2011-12-29 DIAGNOSIS — R760 Raised antibody titer: Secondary | ICD-10-CM

## 2011-12-29 NOTE — Progress Notes (Signed)
12/27/11 Xa level =43.4%, Xa level on 11/24/11 was also borderline at 35.1%  Goal Xa level = 25-35% Pt took 5mg  on 12/28/11 (she didn't have any 1mg  tablets available). Will begin 4.5mg  daily on 12/29/11.  Recheck Xa level in 1 week. RX for warfarin 1mg  tablets phoned to her [facility] pharmacy on [address]

## 2011-12-29 NOTE — Progress Notes (Signed)
I agree with plan

## 2011-12-29 NOTE — Patient Instructions (Addendum)
Take 5mg  x 1 on 12/28/11. Then begin 4.5mg  daily on 12/29/11. Recheck Xa level on 01/05/12 @ 9am. Rx for warfarin 1mg  tablets called to [facility] on [address] and [address] on 12/28/11. Pt knows to take 1 = 4mg  tablet and 1/2 = 1mg  tablet daily.

## 2012-01-03 ENCOUNTER — Other Ambulatory Visit: Payer: Self-pay | Admitting: Pharmacist

## 2012-01-04 ENCOUNTER — Other Ambulatory Visit: Payer: Self-pay | Admitting: Pharmacist

## 2012-01-04 DIAGNOSIS — R760 Raised antibody titer: Secondary | ICD-10-CM

## 2012-01-05 ENCOUNTER — Ambulatory Visit (HOSPITAL_BASED_OUTPATIENT_CLINIC_OR_DEPARTMENT_OTHER): Payer: BC Managed Care – PPO | Admitting: Pharmacist

## 2012-01-05 ENCOUNTER — Other Ambulatory Visit: Payer: BC Managed Care – PPO | Admitting: Lab

## 2012-01-05 DIAGNOSIS — I639 Cerebral infarction, unspecified: Secondary | ICD-10-CM

## 2012-01-05 DIAGNOSIS — R760 Raised antibody titer: Secondary | ICD-10-CM

## 2012-01-05 DIAGNOSIS — R894 Abnormal immunological findings in specimens from other organs, systems and tissues: Secondary | ICD-10-CM

## 2012-01-05 NOTE — Progress Notes (Signed)
Pt doing fine.  Reports one nosebleed on the day after her last visit (she took 5 mg that day).  She has had some spotting. She didn't actually come to the clinic today since lab result not back this morning.   Chrom Xa level returned later today; = 31.7% (goal = 25-35%) She will continue 4.5 mg daily. Repeat Xa level on 01/18/12. Marily Lente, Pharm.D.

## 2012-01-12 ENCOUNTER — Other Ambulatory Visit: Payer: Self-pay | Admitting: Family Medicine

## 2012-01-18 ENCOUNTER — Ambulatory Visit: Payer: BC Managed Care – PPO | Admitting: Pharmacist

## 2012-01-18 ENCOUNTER — Ambulatory Visit: Payer: BC Managed Care – PPO | Admitting: Lab

## 2012-01-18 DIAGNOSIS — I639 Cerebral infarction, unspecified: Secondary | ICD-10-CM

## 2012-01-18 DIAGNOSIS — D6861 Antiphospholipid syndrome: Secondary | ICD-10-CM

## 2012-01-19 LAB — CHROMOGENIC FACTOR X: CHROM XA: 32.5 % — ABNORMAL LOW (ref 86–146)

## 2012-01-19 NOTE — Progress Notes (Signed)
Xa level from 01/18/12 = 32.5% Goal Xa level = 25-35%  Pt reports only slight nosebleeds when she blows her nose every day. These self-resolve in seconds. No other complaints.  She requested that we call in refills for her 4mg  Coumadin, since she is out.  Since her Xa level is therapeutic, will continue current dose of 4.5mg  daily.    Refills called in to her [facility] pharmacy: Coumadin 4mg  tablets Take 1 tablet PO daily as directed. #30, 2 RF

## 2012-02-04 ENCOUNTER — Telehealth: Payer: Self-pay | Admitting: Oncology

## 2012-02-04 NOTE — Telephone Encounter (Signed)
Called pt, appt moved from 03/24/12 to 03/27/12, lab and MD, left a message

## 2012-02-09 ENCOUNTER — Ambulatory Visit (HOSPITAL_BASED_OUTPATIENT_CLINIC_OR_DEPARTMENT_OTHER): Payer: BC Managed Care – PPO | Admitting: Pharmacist

## 2012-02-09 ENCOUNTER — Other Ambulatory Visit (HOSPITAL_BASED_OUTPATIENT_CLINIC_OR_DEPARTMENT_OTHER): Payer: BC Managed Care – PPO

## 2012-02-09 ENCOUNTER — Telehealth: Payer: Self-pay | Admitting: Pharmacist

## 2012-02-09 DIAGNOSIS — D6859 Other primary thrombophilia: Secondary | ICD-10-CM

## 2012-02-09 DIAGNOSIS — I6350 Cerebral infarction due to unspecified occlusion or stenosis of unspecified cerebral artery: Secondary | ICD-10-CM

## 2012-02-09 DIAGNOSIS — R760 Raised antibody titer: Secondary | ICD-10-CM

## 2012-02-09 DIAGNOSIS — I639 Cerebral infarction, unspecified: Secondary | ICD-10-CM

## 2012-02-09 LAB — CHROMOGENIC FACTOR X: CHROM XA: 36.8 % — ABNORMAL LOW (ref 86–146)

## 2012-02-09 NOTE — Telephone Encounter (Signed)
Called in Rx refill for patient's Coumadin 1mg  tablets:  Coumadin 1mg  tablet Take daily as directed. Rosann Auerbach MD: Cyndie Chime

## 2012-02-09 NOTE — Progress Notes (Signed)
Xa level today = 36.8%, goal range = 25-35%. No changes in meds, diet.  No missed doses.  She continues to have small nose bleeds daily that occur shortly after she wakes in the morning.  She wakes up with a stuffy nose, and when she blows her nose, she sees some blood.  These resolve instantly.  No other complaints.  Will continue current dose of 4.5mg  daily for now, and recheck her Xa level in 3 weeks to ensure that she returns to goal range.

## 2012-03-01 ENCOUNTER — Other Ambulatory Visit: Payer: BC Managed Care – PPO | Admitting: Lab

## 2012-03-01 ENCOUNTER — Ambulatory Visit (HOSPITAL_BASED_OUTPATIENT_CLINIC_OR_DEPARTMENT_OTHER): Payer: BC Managed Care – PPO | Admitting: Pharmacist

## 2012-03-01 DIAGNOSIS — R760 Raised antibody titer: Secondary | ICD-10-CM

## 2012-03-01 DIAGNOSIS — I6350 Cerebral infarction due to unspecified occlusion or stenosis of unspecified cerebral artery: Secondary | ICD-10-CM

## 2012-03-01 DIAGNOSIS — I639 Cerebral infarction, unspecified: Secondary | ICD-10-CM

## 2012-03-01 LAB — POCT INR: INR: 0

## 2012-03-01 NOTE — Progress Notes (Signed)
Chrom Xa level = 24.6% (goal = 25-35%) Pt has had some minor bleeding (nose & spotting on underwear). She had GI virus last week while in [place].  She also drank more wine in [place] than she usually drinks. Both likely causes for the Xa level difference. I have instructed pt to take 4 mg Coumadin today & tomorrow then return to her usual dose of 4.5 mg/day. Recheck Xa level in 2 wks. Marily Lente, Pharm.D.

## 2012-03-15 ENCOUNTER — Other Ambulatory Visit (HOSPITAL_BASED_OUTPATIENT_CLINIC_OR_DEPARTMENT_OTHER): Payer: BC Managed Care – PPO | Admitting: Lab

## 2012-03-15 ENCOUNTER — Ambulatory Visit: Payer: BC Managed Care – PPO | Admitting: Pharmacist

## 2012-03-15 DIAGNOSIS — R760 Raised antibody titer: Secondary | ICD-10-CM

## 2012-03-15 DIAGNOSIS — R894 Abnormal immunological findings in specimens from other organs, systems and tissues: Secondary | ICD-10-CM

## 2012-03-15 DIAGNOSIS — I639 Cerebral infarction, unspecified: Secondary | ICD-10-CM

## 2012-03-15 DIAGNOSIS — I6350 Cerebral infarction due to unspecified occlusion or stenosis of unspecified cerebral artery: Secondary | ICD-10-CM

## 2012-03-15 LAB — CHROMOGENIC FACTOR X

## 2012-03-15 LAB — POCT INR: INR: 0

## 2012-03-16 NOTE — Patient Instructions (Signed)
Decrease Coumadin to 4mg  daily starting on 03/15/12. Repeat Xa level on 03/21/12 @ 9am.

## 2012-03-16 NOTE — Progress Notes (Signed)
Xa level from 4/24 = 26.7%; Goal Xa level = 25 - 35% Although Xa level is within goal (the lower end of goal range), will decrease Coumadin to 4mg  daily since patient has been having daily nosebleeds for about the last 10 days. Hopefully the dose change will allow her Xa level to move toward the upper end of goal range and help stop her nosebleeds. Recheck Xa level in 1 week. Discussed with Dr. Cyndie Chime.

## 2012-03-17 ENCOUNTER — Encounter: Payer: Self-pay | Admitting: Family Medicine

## 2012-03-17 ENCOUNTER — Ambulatory Visit (INDEPENDENT_AMBULATORY_CARE_PROVIDER_SITE_OTHER): Payer: BC Managed Care – PPO | Admitting: Family Medicine

## 2012-03-17 VITALS — BP 106/64 | HR 70 | Temp 98.5°F | Wt 140.0 lb

## 2012-03-17 DIAGNOSIS — N390 Urinary tract infection, site not specified: Secondary | ICD-10-CM

## 2012-03-17 LAB — POCT URINALYSIS DIPSTICK
Ketones, UA: NEGATIVE
Spec Grav, UA: 1.025

## 2012-03-17 MED ORDER — NITROFURANTOIN MONOHYD MACRO 100 MG PO CAPS: 100.0000 mg | ORAL_CAPSULE | Freq: Two times a day (BID) | ORAL | Status: DC

## 2012-03-17 NOTE — Progress Notes (Signed)
Addended by: Aniceto Boss A on: 03/17/2012 01:29 PM   Modules accepted: Orders

## 2012-03-17 NOTE — Progress Notes (Signed)
  Subjective:    Patient ID: Natalie Mullen, female    DOB: July 29, 1966, 46 y.o.   MRN: [id number]  HPI Here for 2 days of urinary urgency and burning. No fever or nausea. She drinks plenty of fluids.    Review of Systems  Constitutional: Negative.   Gastrointestinal: Negative.   Genitourinary: Positive for dysuria and frequency. Negative for hematuria and flank pain.       Objective:   Physical Exam  Constitutional: She appears well-developed and well-nourished.  Abdominal: Soft. Bowel sounds are normal. She exhibits no distension and no mass. There is no tenderness. There is no rebound and no guarding.          Assessment & Plan:  Sent for culture

## 2012-03-20 LAB — URINE CULTURE

## 2012-03-21 ENCOUNTER — Ambulatory Visit (HOSPITAL_BASED_OUTPATIENT_CLINIC_OR_DEPARTMENT_OTHER): Payer: BC Managed Care – PPO | Admitting: Pharmacist

## 2012-03-21 ENCOUNTER — Other Ambulatory Visit (HOSPITAL_BASED_OUTPATIENT_CLINIC_OR_DEPARTMENT_OTHER): Payer: BC Managed Care – PPO | Admitting: Lab

## 2012-03-21 DIAGNOSIS — R760 Raised antibody titer: Secondary | ICD-10-CM

## 2012-03-21 DIAGNOSIS — I6350 Cerebral infarction due to unspecified occlusion or stenosis of unspecified cerebral artery: Secondary | ICD-10-CM

## 2012-03-21 DIAGNOSIS — I639 Cerebral infarction, unspecified: Secondary | ICD-10-CM

## 2012-03-21 DIAGNOSIS — D6859 Other primary thrombophilia: Secondary | ICD-10-CM

## 2012-03-21 LAB — CHROMOGENIC FACTOR X: CHROM XA: 30.2 % — ABNORMAL LOW (ref 86–146)

## 2012-03-21 NOTE — Progress Notes (Signed)
Xa level today = 30.2%, goal range = 25-35%.  Pt reports that her nose bleeds have stopped since being on 4mg  daily.  No other complaints today.  She has not missed any doses, and her medications are unchanged save for starting a course of Nitrofurantoin.  Will continue current dose of 4mg  daily, and recheck INR on 03/27/12 with her next lab/MD appt.

## 2012-03-22 NOTE — Progress Notes (Signed)
Quick Note:  Spoke with pt ______ 

## 2012-03-24 ENCOUNTER — Ambulatory Visit: Payer: BC Managed Care – PPO | Admitting: Oncology

## 2012-03-24 ENCOUNTER — Other Ambulatory Visit: Payer: BC Managed Care – PPO | Admitting: Lab

## 2012-03-27 ENCOUNTER — Ambulatory Visit (HOSPITAL_BASED_OUTPATIENT_CLINIC_OR_DEPARTMENT_OTHER): Payer: BC Managed Care – PPO | Admitting: Pharmacist

## 2012-03-27 ENCOUNTER — Ambulatory Visit (HOSPITAL_BASED_OUTPATIENT_CLINIC_OR_DEPARTMENT_OTHER): Payer: BC Managed Care – PPO | Admitting: Oncology

## 2012-03-27 ENCOUNTER — Encounter: Payer: Self-pay | Admitting: Oncology

## 2012-03-27 ENCOUNTER — Other Ambulatory Visit (HOSPITAL_BASED_OUTPATIENT_CLINIC_OR_DEPARTMENT_OTHER): Payer: BC Managed Care – PPO | Admitting: Lab

## 2012-03-27 VITALS — BP 107/70 | HR 47 | Temp 97.1°F | Ht 66.75 in | Wt 145.4 lb

## 2012-03-27 DIAGNOSIS — Z7901 Long term (current) use of anticoagulants: Secondary | ICD-10-CM

## 2012-03-27 DIAGNOSIS — D6861 Antiphospholipid syndrome: Secondary | ICD-10-CM | POA: Insufficient documentation

## 2012-03-27 DIAGNOSIS — I69391 Dysphagia following cerebral infarction: Secondary | ICD-10-CM

## 2012-03-27 DIAGNOSIS — R760 Raised antibody titer: Secondary | ICD-10-CM

## 2012-03-27 DIAGNOSIS — D6859 Other primary thrombophilia: Secondary | ICD-10-CM

## 2012-03-27 DIAGNOSIS — Z8673 Personal history of transient ischemic attack (TIA), and cerebral infarction without residual deficits: Secondary | ICD-10-CM

## 2012-03-27 DIAGNOSIS — I69991 Dysphagia following unspecified cerebrovascular disease: Secondary | ICD-10-CM

## 2012-03-27 DIAGNOSIS — I639 Cerebral infarction, unspecified: Secondary | ICD-10-CM

## 2012-03-27 HISTORY — DX: Antiphospholipid syndrome: D68.61

## 2012-03-27 HISTORY — DX: Dysphagia following cerebral infarction: I69.391

## 2012-03-27 LAB — CBC WITH DIFFERENTIAL/PLATELET
Eosinophils Absolute: 0.6 10*3/uL — ABNORMAL HIGH (ref 0.0–0.5)
LYMPH%: 28 % (ref 14.0–49.7)
MONO#: 0.3 10*3/uL (ref 0.1–0.9)
NEUT#: 3.2 10*3/uL (ref 1.5–6.5)
Platelets: 292 10*3/uL (ref 145–400)
RBC: 4.62 10*6/uL (ref 3.70–5.45)
RDW: 15.9 % — ABNORMAL HIGH (ref 11.2–14.5)
WBC: 5.8 10*3/uL (ref 3.9–10.3)
nRBC: 0 % (ref 0–0)

## 2012-03-27 LAB — COMPREHENSIVE METABOLIC PANEL
BUN: 17 mg/dL (ref 6–23)
CO2: 25 mEq/L (ref 19–32)
Calcium: 8.8 mg/dL (ref 8.4–10.5)
Chloride: 107 mEq/L (ref 96–112)
Creatinine, Ser: 0.84 mg/dL (ref 0.50–1.10)
Glucose, Bld: 90 mg/dL (ref 70–99)

## 2012-03-27 LAB — POCT INR: INR: 0

## 2012-03-27 MED ORDER — WARFARIN SODIUM 4 MG PO TABS: 4.0000 mg | ORAL_TABLET | Freq: Every day | ORAL | Status: DC

## 2012-03-27 NOTE — Progress Notes (Signed)
Hematology and Oncology Follow Up Visit  Natalie Mullen [id number] [birth year] 46 y.o. 03/27/2012 6:49 PM   Principle Diagnosis: Encounter Diagnoses  Name Primary?  . Coagulopathy   . 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 This was diagnosed subsequent to an acute neurologic event occurring in July of 2011. She was in overall excellent health without any medical or surgical illness, on no chronic medications.  She was vacationing with her husband at the beach in [place], [place] last month.  She experienced sudden onset of expressive aphasia, dysarthria and right-sided weakness.  She was rushed to the [facility].  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  She was treated aggressively with TPA and had a thrombectomy.  There was restoration of flow in the MCA with minimal amount of mural thrombus remaining but markedly improved compared with the pretreatment study.  Procedure was done on 06/18/10.    Initial lab study done on 06/17/10 showed a baseline protime of 14.8 seconds, PTT of 52.4 seconds, lab normal up to 39.  Hemoglobin 13.9 on admission 7/27; hematocrit 34.6, MCV 95, white count 5,600, platelet count 275,000.   A lupus anticoagulant was positive.  Antithrombin-3 level 22.1 (21-34).  Anticardiolipin antibody is elevated, IgG 156, normal less than 15.  IgM 76, normal less than 12.5.  ANA positive.  Homocystine 7.3 (3.7 - 13.9).  Free protein S 62% (50-160).  Protein C antigen 63% (70-150).    An echocardiogram done 06/19/10 showed normal systolic function and no valve abnormalities.    She was anticoagulated.  She did see a hematologist in consultation who agreed with the diagnosis of antiphospholipid antibody syndrome and continuing anticoagulation.    She came back to [place]. She  was seen by Dr. Pearlean Brownie from Neurology.  An echocardiogram with a bubble study was done and was normal for any PFO.    She had absolutely no prodromal symptoms prior to this event.  She denies any photosensitivity, no skin rashes, no polymyalgias, polyarthralgia. Of interest, she has had multiple false positive syphilis tests in the past.  She has 3 healthy children.  She had no pregnancy complications. She did have one miscarriage in between her 2nd and 3rd child.  There is no family history of a collagen vascular disease or any blood disorders or clotting problems.    She has had a complete neurologic recovery. She still feels that she may have some speech difficulty but this is not obvious to me. She also feels that she doesn't have 100% strength of her right arm. She has developed swallowing difficulty which she has become more aware of over time. I did have her see Dr. Osborn Coho, ear nose and throat surgeon, last April. He was unable to detect any anatomic defects on flexible laryngoscopy. He felt her symptoms were consistent with esophageal reflux and started her on a proton pump inhibitor. She did get improvement. She still has a sensation that her mouth is dry and that she is not swallowing properly. I wonder if she suffered some cranial nerve damage following the stroke?  We have titrated her Coumadin dose primarily by monitoring chromogenic factor X A. levels since her antiphospholipid antibody levels are so high that they consistently interfere with the interpretation of the pro time test. Our observation has been when we get her factor  X level is down around 25% of control she starts to have problems with epistaxis and increased menstrual bleeding. At a higher level of 30% we are not seeing any significant bleeding. Current Coumadin dose is 4 mg daily. She is also on low-dose aspirin 81 mg daily. Most recent 10 A. level on February 13 was 31.7%.  She reports no headache, change in vision,  slurred speech, focal weakness other than subtle decrease in strength of her right arm, no ataxia, no seizure activity.  She seems to get into trouble when she goes to [place]. She went down to [place] to pay a visit to the interventional neurology team that treated her at diagnosis with thrombolytic therapy. On the way back during a stop over in [place]. [place] she developed flank pain and was found to have a kidney stone and was evaluated in a local emergency department.  Medications: reviewed  Allergies: No Known Allergies  Review of Systems: Constitutional:   No constitutional symptoms Respiratory: No cough or dyspnea Cardiovascular:  No chest pain or palpitations Gastrointestinal: No change in bowel habit Genito-Urinary: No vaginal bleeding Musculoskeletal: No muscle or bone pain Neurologic: See above Skin: No rash or ecchymoses Remaining ROS negative.  Physical Exam: Blood pressure 107/70, pulse 47, temperature 97.1 F (36.2 C), temperature source Oral, height 5' 6.75" (1.695 m), weight 145 lb 6.4 oz (65.953 kg), last menstrual period 02/10/2012. Wt Readings from Last 3 Encounters:  03/27/12 145 lb 6.4 oz (65.953 kg)  03/17/12 140 lb (63.504 kg)  09/22/11 149 lb (67.586 kg)     General appearance: Healthy appearing young woman HENNT: Pharynx no erythema or exudate Lymph nodes:  Breasts: Lungs: Clear to auscultation resonant to percussion Heart: Regular rhythm no murmur or gallop Abdomen: Soft nontender no mass no organomegaly Extremities: No edema no calf tenderness Vascular: Carotids 2+ no bruits, no cyanosis Neurologic: Mental status intact, cranial nerves intact, pupils equal reactive to light, optic disc sharp on the left not well visualized on the right, motor strength 5 over 5, reflexes 1+ symmetric, upper body coordination, finger-to-finger, finger to hand, rapid alternating movements, all normal. Skin: No rash or ecchymoses  Lab Results: Lab Results    Component Value Date   WBC 5.8 03/27/2012   HGB 12.5 03/27/2012   HCT 37.8 03/27/2012   MCV 81.9 03/27/2012   PLT 292 03/27/2012     Chemistry      Component Value Date/Time   NA 140 03/27/2012 1048   K 4.0 03/27/2012 1048   CL 107 03/27/2012 1048   CO2 25 03/27/2012 1048   BUN 17 03/27/2012 1048   CREATININE 0.84 03/27/2012 1048      Component Value Date/Time   CALCIUM 8.8 03/27/2012 1048   ALKPHOS 65 03/27/2012 1048   AST 28 03/27/2012 1048   ALT 26 03/27/2012 1048   BILITOT 0.3 03/27/2012 1048       Impression and Plan: #1. 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 Near complete neurologic recovery following initial thrombolytic therapy. She remains stable on chronic Coumadin and low dose aspirin anticoagulation. She is reminded that if she needs any surgery in the future which she most likely will, I need to be involved in the perioperative management of her anticoagulants. Plan continue indefinite anticoagulation. Unfortunately we have no data on the use of the newer anticoagulants in the setting of patients with antiphospholipid antibodies but hopefully this data we'll accumulate with time and the appropriate clinical trials.  #  2. Persistent dysphagia. I suspect she may have suffered some minor cranial nerve damage during the initial stroke. I'm not sure whether it is worthwhile to do a formal swallowing study. I suggested if her symptoms persist she should get a followup appointment with Dr. Annalee Genta to discuss this. In the interim, I told her to try some lemon drops to see if that helps degenerated saliva since part of the problem appears to be dry mouth.  #3. Hypothyroid on replacement   CC:. Dr. Gershon Crane; Dr. Osborn Coho   Levert Feinstein, MD 5/6/20136:49 PM

## 2012-03-27 NOTE — Patient Instructions (Signed)
A refill on your warfarin 4mg  tablets has been called to your [facility].

## 2012-03-28 ENCOUNTER — Telehealth: Payer: Self-pay | Admitting: Pharmacist

## 2012-03-28 NOTE — Telephone Encounter (Signed)
Spoke with patient to let her know her Xa level was 33.9.  She will continue on 4mg /day.  RTC in 3 weeks on May 29 at 9am.

## 2012-03-31 ENCOUNTER — Encounter: Payer: Self-pay | Admitting: Family Medicine

## 2012-03-31 ENCOUNTER — Ambulatory Visit (INDEPENDENT_AMBULATORY_CARE_PROVIDER_SITE_OTHER): Payer: BC Managed Care – PPO | Admitting: Family Medicine

## 2012-03-31 VITALS — BP 104/66 | HR 74 | Temp 98.4°F | Wt 140.0 lb

## 2012-03-31 DIAGNOSIS — N390 Urinary tract infection, site not specified: Secondary | ICD-10-CM

## 2012-03-31 LAB — POCT URINALYSIS DIPSTICK
Bilirubin, UA: NEGATIVE
Nitrite, UA: NEGATIVE
pH, UA: 8

## 2012-03-31 MED ORDER — CIPROFLOXACIN HCL 500 MG PO TABS: 500.0000 mg | ORAL_TABLET | Freq: Two times a day (BID) | ORAL | Status: AC

## 2012-03-31 NOTE — Progress Notes (Signed)
  Subjective:    Patient ID: Natalie Mullen, female    DOB: February 25, 1966, 46 y.o.   MRN: [id number]  HPI Here for a recurrence of UTI symptoms. She was here a month ago for this, and her culture grew pan-sensitive E. Coli. She took a course of Macrobid, and she felt better. Now for 3 days she has urgency and mild burning again. No fever. She drinks plenty of water.    Review of Systems  Constitutional: Negative.   Genitourinary: Positive for dysuria and urgency. Negative for hematuria, flank pain and difficulty urinating.       Objective:   Physical Exam  Constitutional: She appears well-developed and well-nourished.  Abdominal: Soft. Bowel sounds are normal. She exhibits no distension and no mass. There is no tenderness. There is no rebound and no guarding.          Assessment & Plan:  We will treat with Cipro this time and reculture the urine.

## 2012-03-31 NOTE — Progress Notes (Signed)
Addended by: Aniceto Boss A on: 03/31/2012 03:59 PM   Modules accepted: Orders

## 2012-04-03 LAB — URINE CULTURE: Colony Count: >=100000

## 2012-04-05 ENCOUNTER — Telehealth: Payer: Self-pay | Admitting: Oncology

## 2012-04-05 NOTE — Telephone Encounter (Signed)
Talked to pt, gave her appt date for November 2013 lab before MD visit

## 2012-04-05 NOTE — Progress Notes (Signed)
Quick Note:  Left voice message with results. ______ 

## 2012-04-19 ENCOUNTER — Telehealth: Payer: Self-pay | Admitting: Pharmacist

## 2012-04-19 ENCOUNTER — Ambulatory Visit: Payer: BC Managed Care – PPO

## 2012-04-19 ENCOUNTER — Other Ambulatory Visit: Payer: BC Managed Care – PPO | Admitting: Lab

## 2012-04-26 ENCOUNTER — Other Ambulatory Visit: Payer: BC Managed Care – PPO | Admitting: Lab

## 2012-04-26 ENCOUNTER — Ambulatory Visit: Payer: BC Managed Care – PPO | Admitting: Pharmacist

## 2012-04-26 DIAGNOSIS — R760 Raised antibody titer: Secondary | ICD-10-CM

## 2012-04-26 MED ORDER — WARFARIN SODIUM 4 MG PO TABS: ORAL_TABLET | ORAL | Status: DC

## 2012-04-26 NOTE — Progress Notes (Signed)
Chrom Xa level = 41% (goal = 25-35%) Pt reports she skipped her dose on Saturday.  I spoke w/ pt over phone today.  She needs a dose increase.  She had nose bleeds when she was on 4.5 mg/day but now that she is on 4 mg/day, her Xa level has been slowly rising. Change dose to 4.5 mg MWF; 4 mg other days. Repeat Xa level in 1 week.  Pt in agreement w/ this plan. She is out of her 4 mg tablets so I called in refill to [facility]. Marily Lente, Pharm.D.

## 2012-05-03 ENCOUNTER — Other Ambulatory Visit (HOSPITAL_BASED_OUTPATIENT_CLINIC_OR_DEPARTMENT_OTHER): Payer: BC Managed Care – PPO | Admitting: Lab

## 2012-05-03 ENCOUNTER — Ambulatory Visit (HOSPITAL_BASED_OUTPATIENT_CLINIC_OR_DEPARTMENT_OTHER): Payer: BC Managed Care – PPO | Admitting: Pharmacist

## 2012-05-03 DIAGNOSIS — R894 Abnormal immunological findings in specimens from other organs, systems and tissues: Secondary | ICD-10-CM

## 2012-05-03 DIAGNOSIS — R760 Raised antibody titer: Secondary | ICD-10-CM

## 2012-05-03 DIAGNOSIS — I6350 Cerebral infarction due to unspecified occlusion or stenosis of unspecified cerebral artery: Secondary | ICD-10-CM

## 2012-05-03 DIAGNOSIS — I639 Cerebral infarction, unspecified: Secondary | ICD-10-CM

## 2012-05-03 DIAGNOSIS — D6859 Other primary thrombophilia: Secondary | ICD-10-CM

## 2012-05-03 NOTE — Progress Notes (Signed)
Waiting for Xa level result.  Will call pt tomorrow with result and further instructions.

## 2012-05-04 ENCOUNTER — Ambulatory Visit (HOSPITAL_BASED_OUTPATIENT_CLINIC_OR_DEPARTMENT_OTHER): Payer: BC Managed Care – PPO | Admitting: Pharmacist

## 2012-05-04 DIAGNOSIS — R894 Abnormal immunological findings in specimens from other organs, systems and tissues: Secondary | ICD-10-CM

## 2012-05-04 DIAGNOSIS — I6350 Cerebral infarction due to unspecified occlusion or stenosis of unspecified cerebral artery: Secondary | ICD-10-CM

## 2012-05-04 DIAGNOSIS — R760 Raised antibody titer: Secondary | ICD-10-CM

## 2012-05-04 DIAGNOSIS — I639 Cerebral infarction, unspecified: Secondary | ICD-10-CM

## 2012-05-04 LAB — CHROMOGENIC FACTOR X: CHROM XA: 32.7 % — ABNORMAL LOW (ref 86–146)

## 2012-05-04 NOTE — Patient Instructions (Addendum)
Continue same dose: 4.5 mg MWF; 4 mg other days.  Recheck Xa level in 1 week (05/09/12) since you are experiencing some nosebleeds.  Call us if nosebleeds become more frequent, last longer or become more severe.

## 2012-05-04 NOTE — Progress Notes (Signed)
Xa = 32.7. Goal Xa level = 25-35%. Continue same dose: 4.5 mg MWF; 4 mg other days.  Recheck Xa level in 1 week (05/09/12) since pt experiencing some nosebleeds.  Pt will call us if nosebleeds become more frequent, last longer or become more severe.

## 2012-05-05 ENCOUNTER — Encounter: Payer: Self-pay | Admitting: Oncology

## 2012-05-09 ENCOUNTER — Other Ambulatory Visit (HOSPITAL_BASED_OUTPATIENT_CLINIC_OR_DEPARTMENT_OTHER): Payer: BC Managed Care – PPO | Admitting: Lab

## 2012-05-09 ENCOUNTER — Ambulatory Visit: Payer: BC Managed Care – PPO

## 2012-05-09 DIAGNOSIS — R760 Raised antibody titer: Secondary | ICD-10-CM

## 2012-05-09 DIAGNOSIS — D6859 Other primary thrombophilia: Secondary | ICD-10-CM

## 2012-05-09 DIAGNOSIS — R894 Abnormal immunological findings in specimens from other organs, systems and tissues: Secondary | ICD-10-CM

## 2012-05-10 ENCOUNTER — Ambulatory Visit: Payer: Self-pay | Admitting: Pharmacist

## 2012-05-10 DIAGNOSIS — I639 Cerebral infarction, unspecified: Secondary | ICD-10-CM

## 2012-05-10 DIAGNOSIS — R760 Raised antibody titer: Secondary | ICD-10-CM

## 2012-05-10 NOTE — Progress Notes (Signed)
Chrom Xa level = 27.1% (goal = 25-35%) on 4.5 mg MWF; 4 mg other days. Has had nose bleeds 3-4 times/week on the mornings she plays tennis.  She doesn't stop playing; the bleeding stops spontaneously.  They seldom occur at night. She also has noticed spotting in her underwear on the days she plays tennis.  Minor. Chrom Xa level in range but pt is having some bleeding sxs.   Decrease dose a little bit to 4.5 mg M/F; 4 mg other days. Pt requesting to return in 2 weeks for next lab. Marily Lente, Pharm.D.

## 2012-05-15 ENCOUNTER — Encounter: Payer: Self-pay | Admitting: Family Medicine

## 2012-05-15 ENCOUNTER — Ambulatory Visit (INDEPENDENT_AMBULATORY_CARE_PROVIDER_SITE_OTHER): Payer: BC Managed Care – PPO | Admitting: Family Medicine

## 2012-05-15 VITALS — BP 106/62 | HR 63 | Temp 98.3°F | Wt 142.0 lb

## 2012-05-15 DIAGNOSIS — N390 Urinary tract infection, site not specified: Secondary | ICD-10-CM

## 2012-05-15 DIAGNOSIS — E039 Hypothyroidism, unspecified: Secondary | ICD-10-CM

## 2012-05-15 LAB — POCT URINALYSIS DIPSTICK
Bilirubin, UA: NEGATIVE
Nitrite, UA: NEGATIVE
Urobilinogen, UA: 0.2
pH, UA: 6

## 2012-05-15 LAB — TSH: TSH: 7.15 u[IU]/mL — ABNORMAL HIGH (ref 0.35–5.50)

## 2012-05-15 MED ORDER — CIPROFLOXACIN HCL 500 MG PO TABS: 500.0000 mg | ORAL_TABLET | Freq: Two times a day (BID) | ORAL | Status: AC

## 2012-05-15 NOTE — Progress Notes (Signed)
  Subjective:    Patient ID: Natalie Mullen, female    DOB: 19-Mar-1966, 46 y.o.   MRN: [id number]  HPI Here for the onset yesterday of urgency to urinate and burning. No fever or nausea. She drinks very little fluids except coffee.    Review of Systems  Constitutional: Negative.   Genitourinary: Positive for dysuria and urgency. Negative for hematuria, flank pain, difficulty urinating and pelvic pain.       Objective:   Physical Exam  Constitutional: She appears well-developed and well-nourished.  Neck: Neck supple. No thyromegaly present.  Abdominal: Soft. Bowel sounds are normal. She exhibits no distension and no mass. There is no tenderness. There is no rebound and no guarding.  Lymphadenopathy:    She has no cervical adenopathy.          Assessment & Plan:  Treat with Cipro. Culture the urine. Advised her to drink plenty of water. Check another TSH.

## 2012-05-15 NOTE — Addendum Note (Signed)
Addended by: Aniceto Boss A on: 05/15/2012 11:10 AM   Modules accepted: Orders

## 2012-05-17 ENCOUNTER — Encounter: Payer: Self-pay | Admitting: Family Medicine

## 2012-05-17 LAB — URINE CULTURE

## 2012-05-17 MED ORDER — LEVOTHYROXINE SODIUM 100 MCG PO TABS: 100.0000 ug | ORAL_TABLET | Freq: Every day | ORAL | Status: DC

## 2012-05-17 NOTE — Addendum Note (Signed)
Addended by: Aniceto Boss A on: 05/17/2012 12:57 PM   Modules accepted: Orders

## 2012-05-17 NOTE — Progress Notes (Signed)
Quick Note:  I spoke with pt, sent script e-scribe and put a copy of results in mail. ______ 

## 2012-05-18 NOTE — Progress Notes (Signed)
Quick Note:  I spoke with pt ______ 

## 2012-05-22 ENCOUNTER — Ambulatory Visit: Payer: BC Managed Care – PPO

## 2012-05-22 ENCOUNTER — Other Ambulatory Visit: Payer: BC Managed Care – PPO | Admitting: Lab

## 2012-05-23 ENCOUNTER — Telehealth: Payer: Self-pay | Admitting: Pharmacist

## 2012-05-29 ENCOUNTER — Ambulatory Visit (HOSPITAL_BASED_OUTPATIENT_CLINIC_OR_DEPARTMENT_OTHER): Payer: BC Managed Care – PPO | Admitting: Pharmacist

## 2012-05-29 ENCOUNTER — Telehealth: Payer: Self-pay | Admitting: Pharmacist

## 2012-05-29 ENCOUNTER — Other Ambulatory Visit (HOSPITAL_BASED_OUTPATIENT_CLINIC_OR_DEPARTMENT_OTHER): Payer: BC Managed Care – PPO | Admitting: Lab

## 2012-05-29 DIAGNOSIS — I6350 Cerebral infarction due to unspecified occlusion or stenosis of unspecified cerebral artery: Secondary | ICD-10-CM

## 2012-05-29 DIAGNOSIS — R760 Raised antibody titer: Secondary | ICD-10-CM

## 2012-05-29 DIAGNOSIS — I639 Cerebral infarction, unspecified: Secondary | ICD-10-CM

## 2012-05-29 DIAGNOSIS — R894 Abnormal immunological findings in specimens from other organs, systems and tissues: Secondary | ICD-10-CM

## 2012-05-29 MED ORDER — WARFARIN SODIUM 4 MG PO TABS: ORAL_TABLET | ORAL | Status: DC

## 2012-05-30 NOTE — Progress Notes (Signed)
Chrom Xa level = 38.8% on 05/29/12 currently taking Coumadin 4.5 mg M/F; 4 mg other days. No problems to report. Leaving for [place] for 2 weeks on 06/05/12.   I s/w Dr. Cyndie Chime & we agree that pt should be increased back to 4.5 mg MWF; 4 mg other days. Repeat Xa level when pt returns from vacation: 06/26/12. I discussed this w/ pt over phone & she understands & agrees w/ plan. Marily Lente, Pharm.D.

## 2012-06-07 ENCOUNTER — Encounter: Payer: Self-pay | Admitting: Oncology

## 2012-06-08 ENCOUNTER — Encounter: Payer: Self-pay | Admitting: Pharmacist

## 2012-06-08 NOTE — Progress Notes (Signed)
Pt called from [place] c/o nosebleeds.  [place]  The nosebleeds are occurring every day & they are practically continuous. Her current dose of Coumadin is 4.5 mg MWF/ 4 mg other days.  We changed her dose 10 days ago due to elevated Chrom Xa level on 4.5 mg M/F ; 4 mg other days. We will empirically reduce her dose back to 4.5 mg M/F; 4 mg other days while she is on her trip. Pt made aware that high altitude can increase risk for clotting.  She will call if there are other concerns. Return 06/26/12 after she returns from [place]. Marily Lente, Pharm.D.

## 2012-06-09 ENCOUNTER — Encounter: Payer: Self-pay | Admitting: Pharmacist

## 2012-06-09 NOTE — Progress Notes (Signed)
I agree w plan - we may want to cut back to just 4mg  daily until she returns  Dr Reece Agar

## 2012-06-09 NOTE — Progress Notes (Signed)
I called pt in [place] after speaking w/ Dr. Cyndie Chime. I have instructed her to decrease her Coumadin to 4 mg/day. Pt understands plan & will call us if she continues to notice bleeding. Returns for lab here on 06/26/12. Marily Lente, Pharm.D.

## 2012-06-09 NOTE — Progress Notes (Signed)
Thanks   I'm impressed!!!!

## 2012-06-26 ENCOUNTER — Ambulatory Visit: Payer: BC Managed Care – PPO

## 2012-06-26 ENCOUNTER — Ambulatory Visit (HOSPITAL_BASED_OUTPATIENT_CLINIC_OR_DEPARTMENT_OTHER): Payer: BC Managed Care – PPO | Admitting: Pharmacist

## 2012-06-26 ENCOUNTER — Other Ambulatory Visit (HOSPITAL_BASED_OUTPATIENT_CLINIC_OR_DEPARTMENT_OTHER): Payer: BC Managed Care – PPO | Admitting: Lab

## 2012-06-26 DIAGNOSIS — R760 Raised antibody titer: Secondary | ICD-10-CM

## 2012-06-26 DIAGNOSIS — I639 Cerebral infarction, unspecified: Secondary | ICD-10-CM

## 2012-06-26 DIAGNOSIS — R894 Abnormal immunological findings in specimens from other organs, systems and tissues: Secondary | ICD-10-CM

## 2012-06-26 DIAGNOSIS — I6350 Cerebral infarction due to unspecified occlusion or stenosis of unspecified cerebral artery: Secondary | ICD-10-CM

## 2012-06-26 LAB — POCT INR: INR: 0

## 2012-06-26 NOTE — Progress Notes (Signed)
Continue 4mg daily. Recheck Xa level on 07/10/12. Lab = 8:45am; Coumadin clinic = 9am. 

## 2012-06-26 NOTE — Patient Instructions (Addendum)
Continue 4mg  daily. Recheck Xa level on 07/10/12. Lab = 8:45am; Coumadin clinic = 9am.

## 2012-07-10 ENCOUNTER — Other Ambulatory Visit (HOSPITAL_BASED_OUTPATIENT_CLINIC_OR_DEPARTMENT_OTHER): Payer: BC Managed Care – PPO | Admitting: Lab

## 2012-07-10 ENCOUNTER — Ambulatory Visit (HOSPITAL_BASED_OUTPATIENT_CLINIC_OR_DEPARTMENT_OTHER): Payer: BC Managed Care – PPO | Admitting: Pharmacist

## 2012-07-10 ENCOUNTER — Ambulatory Visit: Payer: BC Managed Care – PPO | Admitting: Pharmacist

## 2012-07-10 DIAGNOSIS — R760 Raised antibody titer: Secondary | ICD-10-CM

## 2012-07-10 DIAGNOSIS — R894 Abnormal immunological findings in specimens from other organs, systems and tissues: Secondary | ICD-10-CM

## 2012-07-10 DIAGNOSIS — Z7901 Long term (current) use of anticoagulants: Secondary | ICD-10-CM

## 2012-07-10 DIAGNOSIS — I639 Cerebral infarction, unspecified: Secondary | ICD-10-CM

## 2012-07-10 DIAGNOSIS — I6350 Cerebral infarction due to unspecified occlusion or stenosis of unspecified cerebral artery: Secondary | ICD-10-CM

## 2012-07-10 LAB — POCT INR

## 2012-07-11 LAB — CHROMOGENIC FACTOR X

## 2012-07-11 NOTE — Patient Instructions (Signed)
Continue 4mg  daily. Recheck Xa level on 07/17/12. Lab = 8:45am; Coumadin clinic = 9am.

## 2012-07-11 NOTE — Progress Notes (Signed)
Xa = 34.9% from 07/10/12. Goal Xa level = 25-35%.  Continue 4mg  daily. Recheck Xa level on 07/17/12. Lab = 8:45am; Coumadin clinic = 9am.

## 2012-07-17 ENCOUNTER — Ambulatory Visit: Payer: BC Managed Care – PPO

## 2012-07-17 ENCOUNTER — Other Ambulatory Visit: Payer: BC Managed Care – PPO | Admitting: Lab

## 2012-07-17 DIAGNOSIS — R760 Raised antibody titer: Secondary | ICD-10-CM

## 2012-07-17 LAB — CHROMOGENIC FACTOR X: CHROM XA: 30 % — ABNORMAL LOW (ref 86–146)

## 2012-07-17 LAB — POCT INR: INR: 0

## 2012-07-18 ENCOUNTER — Other Ambulatory Visit: Payer: Self-pay | Admitting: Pharmacist

## 2012-07-18 ENCOUNTER — Ambulatory Visit (HOSPITAL_BASED_OUTPATIENT_CLINIC_OR_DEPARTMENT_OTHER): Payer: BC Managed Care – PPO | Admitting: Pharmacist

## 2012-07-18 DIAGNOSIS — R760 Raised antibody titer: Secondary | ICD-10-CM

## 2012-07-18 DIAGNOSIS — Z7901 Long term (current) use of anticoagulants: Secondary | ICD-10-CM

## 2012-07-18 DIAGNOSIS — I6350 Cerebral infarction due to unspecified occlusion or stenosis of unspecified cerebral artery: Secondary | ICD-10-CM

## 2012-07-18 DIAGNOSIS — I639 Cerebral infarction, unspecified: Secondary | ICD-10-CM

## 2012-07-18 NOTE — Telephone Encounter (Signed)
Called in refills for Coumadin 4mg  tablets: 1 tablet daily or as directed.  #30, 5 RF MD: [place].

## 2012-07-18 NOTE — Progress Notes (Signed)
Xa Level from 8/26 = 30% (Goal = 25-35%) No problems to report per pt.  No changes in meds or diet.  Pt doing well. Will continue current dose of 4mg  daily, and recheck INR in 2-3 weeks.  Scheduled repeat Xa level on 08/07/12 per pt request.

## 2012-08-04 ENCOUNTER — Encounter: Payer: Self-pay | Admitting: Oncology

## 2012-08-07 ENCOUNTER — Ambulatory Visit (HOSPITAL_BASED_OUTPATIENT_CLINIC_OR_DEPARTMENT_OTHER): Payer: BC Managed Care – PPO | Admitting: Pharmacist

## 2012-08-07 ENCOUNTER — Other Ambulatory Visit: Payer: BC Managed Care – PPO | Admitting: Lab

## 2012-08-07 DIAGNOSIS — I6350 Cerebral infarction due to unspecified occlusion or stenosis of unspecified cerebral artery: Secondary | ICD-10-CM

## 2012-08-07 DIAGNOSIS — R760 Raised antibody titer: Secondary | ICD-10-CM

## 2012-08-07 LAB — CHROMOGENIC FACTOR X

## 2012-08-07 LAB — POCT INR: INR: 0

## 2012-08-08 NOTE — Progress Notes (Signed)
Xa level therapeutic today (29.2%) Pt opted to not be seen in Coumadin Clinic today. Will have pt continue current dose, and recheck INR in 4 weeks.   Left vm on home phone with the above information and call us back to schedule her next INR check.

## 2012-08-28 ENCOUNTER — Ambulatory Visit (INDEPENDENT_AMBULATORY_CARE_PROVIDER_SITE_OTHER): Payer: BC Managed Care – PPO | Admitting: Family Medicine

## 2012-08-28 ENCOUNTER — Encounter: Payer: Self-pay | Admitting: Family Medicine

## 2012-08-28 VITALS — BP 100/60 | HR 62 | Temp 98.4°F | Wt 140.0 lb

## 2012-08-28 DIAGNOSIS — R8290 Unspecified abnormal findings in urine: Secondary | ICD-10-CM

## 2012-08-28 DIAGNOSIS — R82998 Other abnormal findings in urine: Secondary | ICD-10-CM

## 2012-08-28 LAB — POCT URINALYSIS DIPSTICK
Blood, UA: NEGATIVE
Glucose, UA: NEGATIVE
Spec Grav, UA: >=1.03
Urobilinogen, UA: 0.2
pH, UA: 5

## 2012-08-28 NOTE — Addendum Note (Signed)
Addended by: Aniceto Boss A on: 08/28/2012 10:31 AM   Modules accepted: Orders

## 2012-08-28 NOTE — Progress Notes (Signed)
  Subjective:    Patient ID: Natalie Mullen, female    DOB: 1965-12-24, 46 y.o.   MRN: [id number]  HPI Here to check her urine. For 4 days last week she noticed a strong odor to the urine, but now for the past 2 days it has seemed normal. No burning or urgency. She sometimes does not drink much water.    Review of Systems  Constitutional: Negative.   Gastrointestinal: Negative.   Genitourinary: Negative.        Objective:   Physical Exam  Constitutional: She appears well-developed and well-nourished.  Abdominal: Soft. Bowel sounds are normal. She exhibits no distension and no mass. There is no tenderness. There is no rebound and no guarding.          Assessment & Plan:  This does not sound like an infection, but she probably was a little dehydrated last week. We will culture her urine sample today. She needs to drink more water every day.

## 2012-08-30 LAB — URINE CULTURE: Colony Count: >=100000

## 2012-09-01 MED ORDER — CIPROFLOXACIN HCL 500 MG PO TABS: 500.0000 mg | ORAL_TABLET | Freq: Two times a day (BID) | ORAL | Status: DC

## 2012-09-01 NOTE — Addendum Note (Signed)
Addended by: Aniceto Boss A on: 09/01/2012 12:25 PM   Modules accepted: Orders

## 2012-09-01 NOTE — Progress Notes (Signed)
Quick Note:  I tried to reach pt by phone & no answer. I did send script e-scribe to [facility] on [address] and I left a voice message with results and asked that she call me back. ______

## 2012-09-01 NOTE — Progress Notes (Signed)
Quick Note:  I spoke with pt ______ 

## 2012-09-05 ENCOUNTER — Telehealth: Payer: Self-pay | Admitting: Pharmacist

## 2012-09-05 ENCOUNTER — Ambulatory Visit: Payer: BC Managed Care – PPO

## 2012-09-05 ENCOUNTER — Other Ambulatory Visit: Payer: BC Managed Care – PPO | Admitting: Lab

## 2012-09-05 NOTE — Telephone Encounter (Addendum)
Pt called to change her lab appointment from 09/05/12 to 09/14/12. She was initially scheduled for 8:30am on 09/14/12. Her lab appointment on 09/14/12 has been changed to 8:45am with Coumadin clinic (if needed) at 9am on 09/14/12.  Pt also stated that she started Cipro 500mg  po BID x 7 days on 09/01/12. She will complete the antibiotic on 09/07/12. She may be more susceptible to bruising, nosebleeds, etc due to the drug interaction of Cipro and Coumadin. She will call us if she develops any problems before her next Xa level on 09/14/12.

## 2012-09-14 ENCOUNTER — Ambulatory Visit: Payer: BC Managed Care – PPO

## 2012-09-14 ENCOUNTER — Other Ambulatory Visit: Payer: BC Managed Care – PPO | Admitting: Lab

## 2012-09-14 DIAGNOSIS — R760 Raised antibody titer: Secondary | ICD-10-CM

## 2012-09-15 ENCOUNTER — Telehealth: Payer: Self-pay | Admitting: Pharmacist

## 2012-09-15 ENCOUNTER — Ambulatory Visit: Payer: Self-pay | Admitting: Pharmacist

## 2012-09-15 DIAGNOSIS — R760 Raised antibody titer: Secondary | ICD-10-CM

## 2012-09-15 DIAGNOSIS — I639 Cerebral infarction, unspecified: Secondary | ICD-10-CM

## 2012-09-15 NOTE — Progress Notes (Signed)
Chrom Xa level = 29.7% on 4 mg/day Pt doing well w/o complaints. Cont same dose. Repeat Xa level in 1 month. Marily Lente, Pharm.D.

## 2012-09-17 ENCOUNTER — Other Ambulatory Visit: Payer: Self-pay | Admitting: Family Medicine

## 2012-09-25 ENCOUNTER — Other Ambulatory Visit: Payer: BC Managed Care – PPO | Admitting: Lab

## 2012-10-03 ENCOUNTER — Ambulatory Visit: Payer: BC Managed Care – PPO | Admitting: Oncology

## 2012-10-06 ENCOUNTER — Other Ambulatory Visit (HOSPITAL_BASED_OUTPATIENT_CLINIC_OR_DEPARTMENT_OTHER): Payer: BC Managed Care – PPO | Admitting: Lab

## 2012-10-06 ENCOUNTER — Ambulatory Visit (HOSPITAL_BASED_OUTPATIENT_CLINIC_OR_DEPARTMENT_OTHER): Payer: BC Managed Care – PPO | Admitting: Oncology

## 2012-10-06 ENCOUNTER — Ambulatory Visit: Payer: BC Managed Care – PPO

## 2012-10-06 ENCOUNTER — Telehealth: Payer: Self-pay | Admitting: Oncology

## 2012-10-06 VITALS — BP 115/62 | HR 55 | Temp 97.5°F | Resp 20 | Ht 66.75 in | Wt 143.1 lb

## 2012-10-06 DIAGNOSIS — I639 Cerebral infarction, unspecified: Secondary | ICD-10-CM

## 2012-10-06 DIAGNOSIS — R760 Raised antibody titer: Secondary | ICD-10-CM

## 2012-10-06 DIAGNOSIS — D689 Coagulation defect, unspecified: Secondary | ICD-10-CM

## 2012-10-06 DIAGNOSIS — D6861 Antiphospholipid syndrome: Secondary | ICD-10-CM

## 2012-10-06 DIAGNOSIS — D6859 Other primary thrombophilia: Secondary | ICD-10-CM

## 2012-10-06 DIAGNOSIS — R894 Abnormal immunological findings in specimens from other organs, systems and tissues: Secondary | ICD-10-CM

## 2012-10-06 DIAGNOSIS — Z8673 Personal history of transient ischemic attack (TIA), and cerebral infarction without residual deficits: Secondary | ICD-10-CM

## 2012-10-06 DIAGNOSIS — Z7901 Long term (current) use of anticoagulants: Secondary | ICD-10-CM

## 2012-10-06 LAB — CBC WITH DIFFERENTIAL/PLATELET
BASO%: 1.1 % (ref 0.0–2.0)
Basophils Absolute: 0.1 10*3/uL (ref 0.0–0.1)
HCT: 37.8 % (ref 34.8–46.6)
HGB: 13.2 g/dL (ref 11.6–15.9)
LYMPH%: 32.4 % (ref 14.0–49.7)
MCH: 29.3 pg (ref 25.1–34.0)
MCHC: 34.8 g/dL (ref 31.5–36.0)
MONO#: 0.3 10*3/uL (ref 0.1–0.9)
NEUT%: 50.2 % (ref 38.4–76.8)
Platelets: 291 10*3/uL (ref 145–400)
WBC: 6.2 10*3/uL (ref 3.9–10.3)

## 2012-10-06 LAB — COMPREHENSIVE METABOLIC PANEL (CC13)
ALT: 31 U/L (ref 0–55)
Albumin: 3.6 g/dL (ref 3.5–5.0)
CO2: 24 mEq/L (ref 22–29)
Calcium: 9.3 mg/dL (ref 8.4–10.4)
Chloride: 110 mEq/L — ABNORMAL HIGH (ref 98–107)
Glucose: 81 mg/dl (ref 70–99)
Sodium: 139 mEq/L (ref 136–145)
Total Bilirubin: 0.77 mg/dL (ref 0.20–1.20)
Total Protein: 7.3 g/dL (ref 6.4–8.3)

## 2012-10-06 NOTE — Telephone Encounter (Signed)
gv and printed pt appt schedule for May 2014 ° °

## 2012-10-08 NOTE — Progress Notes (Signed)
Hematology and Oncology Follow Up Visit  Natalie Mullen [id number] [birth year] 46 y.o. 10/08/2012 9:08 AM   Principle Diagnosis: Encounter Diagnoses  Name Primary?  . ANTICARDIOLIPIN ANTIBODY SYNDROME Yes  . Antiphospholipid antibody with hypercoagulable state   . CVA (cerebral vascular accident)   . Lupus anticoagulant positive      Interim History:   Follow up for this pleasant 46 year old woman who is on chronic Coumadin anticoagulation due to antiphospholipid antibody syndrome. This was diagnosed subsequent to an acute neurologic event occurring in July of 2011.  She was in overall excellent health without any medical or surgical illness, on no chronic medications. She was vacationing with her husband at the beach in [place], [place] last month. She experienced sudden onset of expressive aphasia, dysarthria and right-sided weakness. She was rushed to the [facility]. 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 She was treated aggressively with TPA and had a thrombectomy. There was restoration of flow in the MCA with minimal amount of mural thrombus remaining but markedly improved compared with the pretreatment study. Procedure was done on 06/18/10.  Initial lab study done on 06/17/10 showed a baseline protime of 14.8 seconds, PTT of 52.4 seconds, lab normal up to 39. Hemoglobin 13.9 on admission 7/27; hematocrit 34.6, MCV 95, white count 5,600, platelet count 275,000. A lupus anticoagulant was positive. Antithrombin-3 level 22.1 (21-34). Anticardiolipin antibody is elevated, IgG 156, normal less than 15. IgM 76, normal less than 12.5. ANA positive. Homocystine 7.3 (3.7 - 13.9). Free protein S 62% (50-160). Protein C antigen 63% (70-150).  An echocardiogram done 06/19/10 showed normal systolic function and no valve abnormalities.  She was anticoagulated. She did see a hematologist in consultation who agreed with the diagnosis of antiphospholipid antibody  syndrome and continuing anticoagulation.  She came back to [place]. She was seen by Dr. Pearlean Brownie from Neurology. An echocardiogram with a bubble study was done and was normal for any PFO.  She had absolutely no prodromal symptoms prior to this event. She denied any photosensitivity, no skin rashes, no polymyalgias, polyarthralgia. Of interest, she has had multiple false positive syphilis tests in the past. She has 3 healthy children. She had no pregnancy complications. She did have one miscarriage in between her 2nd and 3rd child. There is no family history of a collagen vascular disease or any blood disorders or clotting problems.  She has had a complete neurologic recovery. She still feels that she may have some speech difficulty but this is not obvious to me. She also feels that she doesn't have 100% strength of her right arm.  She has developed swallowing difficulty which she has become more aware of over time. I did have her see Dr. Osborn Coho, ear nose and throat surgeon, last April. He was unable to detect any anatomic defects on flexible laryngoscopy. He felt her symptoms were consistent with esophageal reflux and started her on a proton pump inhibitor. She did get improvement. She still has a sensation that her mouth is dry and that she is not swallowing properly. I wonder if she suffered some cranial nerve damage following the stroke?  We have titrated her Coumadin dose primarily by monitoring chromogenic factor X A. levels since her antiphospholipid antibody levels are so high that they consistently interfere with the interpretation of the pro time test. Our observation has been when we get her factor X level is down around 25% of control she starts to have problems with epistaxis and  increased menstrual bleeding. At a higher level of 30% we are not seeing any significant bleeding. Current Coumadin dose is 4 mg daily. She is also on low-dose aspirin 81 mg daily. Most recent 10 A. level on  February 13 was 31.7%.  Overall she is doing well. Unfortunately she recently separated from her husband. She denies any headache or change in vision, no focal weakness, she has a sensation that some of the fingers on her right hand move faster when she is typing but she is not making any typographical errors. Her periods are irregular but she has not had excessive bleeding on the current dose of Coumadin. She gets an occasional minor self limited and nose bleed.   Medications: reviewed  Allergies: No Known Allergies  Review of Systems: Constitutional:  No constitutional symptoms  Respiratory: No cough or dyspnea Cardiovascular:  No chest pain or palpitations Gastrointestinal: No change in bowel habit Genito-Urinary: See above Musculoskeletal: No muscle or bone pain Neurologic: See above Skin: No rash or ecchymosis Remaining ROS negative.  Physical Exam: Blood pressure 115/62, pulse 55, temperature 97.5 F (36.4 C), temperature source Oral, resp. rate 20, height 5' 6.75" (1.695 m), weight 143 lb 1.6 oz (64.91 kg). Wt Readings from Last 3 Encounters:  10/06/12 143 lb 1.6 oz (64.91 kg)  08/28/12 140 lb (63.504 kg)  05/15/12 142 lb (64.411 kg)     General appearance: Well-nourished woman HENNT: Pharynx no erythema or exudate Lymph nodes: No adenopathy  Breasts: Not examined Lungs: Clear to auscultation resonant to percussion Heart: Regular rhythm no murmur gallop or click Abdomen: Soft, nontender, no mass, no organomegaly Extremities: No edema, no calf tenderness Vascular: Carotids 2+, no bruits, no cyanosis Neurologic: Mental status intact, PERRLA, optic disc sharp, vessels normal, no hemorrhage exudate, motor strength 5 over 5, reflexes 1+ symmetric, sensation intact to vibration over the fingertips by tuning fork exam Skin: No rash or ecchymosis  Lab Results: Lab Results  Component Value Date   WBC 6.2 10/06/2012   HGB 13.2 10/06/2012   HCT 37.8 10/06/2012   MCV 84.0  10/06/2012   PLT 291 10/06/2012     Chemistry      Component Value Date/Time   NA 140 03/27/2012 1048   K 4.0 03/27/2012 1048   CL 107 03/27/2012 1048   CO2 25 03/27/2012 1048   BUN 17 03/27/2012 1048   CREATININE 0.84 03/27/2012 1048      Component Value Date/Time   CALCIUM 8.8 03/27/2012 1048   ALKPHOS 65 03/27/2012 1048   AST 28 03/27/2012 1048   ALT 26 03/27/2012 1048   BILITOT 0.3 03/27/2012 1048        Impression: #1. 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 Near complete neurologic recovery following initial thrombolytic therapy.  She remains stable on chronic Coumadin and low dose aspirin anticoagulation.   Plan continue indefinite anticoagulation.  Unfortunately we have no data on the use of the newer anticoagulants in the setting of patients with antiphospholipid antibodies but hopefully this data will accumulate with time and the appropriate clinical trials. We discussed this again today. Although the new drugs offer many advantages, a population of patients with antiphospholipid antibodies has not been tested in a standard way. In addition we still don't have an antidote to these drugs.    #2. Hypothyroid on replacement     CC:. Dr. Shellia Carwin   Levert Feinstein, MD 11/17/20139:08 AM

## 2012-10-09 ENCOUNTER — Ambulatory Visit (HOSPITAL_BASED_OUTPATIENT_CLINIC_OR_DEPARTMENT_OTHER): Payer: BC Managed Care – PPO | Admitting: Pharmacist

## 2012-10-09 DIAGNOSIS — R760 Raised antibody titer: Secondary | ICD-10-CM

## 2012-10-09 DIAGNOSIS — I639 Cerebral infarction, unspecified: Secondary | ICD-10-CM

## 2012-10-09 DIAGNOSIS — I6350 Cerebral infarction due to unspecified occlusion or stenosis of unspecified cerebral artery: Secondary | ICD-10-CM

## 2012-10-09 LAB — POCT INR: INR: 0

## 2012-10-09 NOTE — Progress Notes (Signed)
Chrom Xa level = 45.8% (drawn 10/06/12) Pt on 4 mg/day No missed doses. No neurological defects or sxs of CVA at present. I have communicated w/ Dr. Cyndie Chime & w/ pt.  We have decided to have Natalie Mullen increase her Coumadin to 4.5 mg daily. No Lovenox needed at present. Repeat Xa level on Mon. 11/25. Marily Lente, Pharm.D.

## 2012-10-12 ENCOUNTER — Other Ambulatory Visit: Payer: BC Managed Care – PPO | Admitting: Lab

## 2012-10-12 ENCOUNTER — Ambulatory Visit: Payer: BC Managed Care – PPO

## 2012-10-13 ENCOUNTER — Ambulatory Visit: Payer: BC Managed Care – PPO

## 2012-10-16 ENCOUNTER — Ambulatory Visit: Payer: BC Managed Care – PPO

## 2012-10-16 ENCOUNTER — Other Ambulatory Visit: Payer: BC Managed Care – PPO | Admitting: Lab

## 2012-10-17 ENCOUNTER — Ambulatory Visit: Payer: BC Managed Care – PPO

## 2012-10-17 ENCOUNTER — Other Ambulatory Visit: Payer: BC Managed Care – PPO | Admitting: Lab

## 2012-10-17 DIAGNOSIS — R760 Raised antibody titer: Secondary | ICD-10-CM

## 2012-10-17 LAB — CHROMOGENIC FACTOR X: CHROM XA: 35.7 % — ABNORMAL LOW (ref 86–146)

## 2012-10-20 ENCOUNTER — Ambulatory Visit (HOSPITAL_BASED_OUTPATIENT_CLINIC_OR_DEPARTMENT_OTHER): Payer: BC Managed Care – PPO | Admitting: Pharmacist

## 2012-10-20 ENCOUNTER — Ambulatory Visit: Payer: BC Managed Care – PPO | Admitting: Oncology

## 2012-10-20 DIAGNOSIS — R760 Raised antibody titer: Secondary | ICD-10-CM

## 2012-10-20 DIAGNOSIS — I6350 Cerebral infarction due to unspecified occlusion or stenosis of unspecified cerebral artery: Secondary | ICD-10-CM

## 2012-10-20 DIAGNOSIS — I639 Cerebral infarction, unspecified: Secondary | ICD-10-CM

## 2012-10-20 NOTE — Progress Notes (Signed)
Xa level from 11/26 very close to goal range (35.7%). No changes in meds or diet.  No problems.  Pt concerned that her dose is way too high.  Assured pt that her Xa level is not consistent with her being over-anticoagulated.    Will continue current dose of 4.5mg  daily.  Recheck Xa level in ~2 weeks.

## 2012-10-26 ENCOUNTER — Telehealth: Payer: Self-pay | Admitting: Pharmacist

## 2012-10-26 NOTE — Telephone Encounter (Signed)
called and informed pt of new appt time for lab/CC on 12/9.  Left msg on voicemail.

## 2012-10-26 NOTE — Telephone Encounter (Signed)
Patient called needed to reschedule coumadin clinic appointment. Pt originally was scheduled for 12/9 and now has rescheduled to 12/10 with lab at 830am and lab at 845am. Christell Faith, PharmD

## 2012-10-30 ENCOUNTER — Other Ambulatory Visit: Payer: BC Managed Care – PPO | Admitting: Lab

## 2012-10-30 ENCOUNTER — Ambulatory Visit: Payer: BC Managed Care – PPO

## 2012-10-31 ENCOUNTER — Other Ambulatory Visit (HOSPITAL_BASED_OUTPATIENT_CLINIC_OR_DEPARTMENT_OTHER): Payer: BC Managed Care – PPO | Admitting: Lab

## 2012-10-31 ENCOUNTER — Ambulatory Visit (HOSPITAL_BASED_OUTPATIENT_CLINIC_OR_DEPARTMENT_OTHER): Payer: BC Managed Care – PPO | Admitting: Pharmacist

## 2012-10-31 DIAGNOSIS — I6350 Cerebral infarction due to unspecified occlusion or stenosis of unspecified cerebral artery: Secondary | ICD-10-CM

## 2012-10-31 DIAGNOSIS — R894 Abnormal immunological findings in specimens from other organs, systems and tissues: Secondary | ICD-10-CM

## 2012-10-31 DIAGNOSIS — R760 Raised antibody titer: Secondary | ICD-10-CM

## 2012-10-31 LAB — POCT INR: INR: 0

## 2012-11-01 NOTE — Progress Notes (Signed)
Xa level from 10/31/12 = 24.6% (goal 25-35%) on 4.5mg  daily No missed doses.  Still experiencing intermittent nosebleeds that occur daily.  These never last long, and occur when she blows her nose.  Advised patient to use a humidifier to increase the moisture level in her mucous membranes, and to try not to blow her nose so hard.  These membranes probably need to heal.   Will have patient continue current dose of Coumadin for now, and recheck Xa level in about 1 week to ensure that she stays in therapeutic range.

## 2012-11-03 ENCOUNTER — Telehealth: Payer: Self-pay | Admitting: Pharmacist

## 2012-11-03 NOTE — Telephone Encounter (Signed)
Patient left message on CC phone.  She was concerned her coumadin dose of 4.5 mg daily was too high with her last Xa level of 24.6.  While this is on low end of her range, she would like to try 4mg  daily since she has been having nose bleeds.  Told her that was fine.  She is scheduled to be rechecked next Tue.

## 2012-11-07 ENCOUNTER — Other Ambulatory Visit (HOSPITAL_BASED_OUTPATIENT_CLINIC_OR_DEPARTMENT_OTHER): Payer: BC Managed Care – PPO | Admitting: Lab

## 2012-11-07 ENCOUNTER — Ambulatory Visit: Payer: BC Managed Care – PPO

## 2012-11-07 DIAGNOSIS — R894 Abnormal immunological findings in specimens from other organs, systems and tissues: Secondary | ICD-10-CM

## 2012-11-07 DIAGNOSIS — R760 Raised antibody titer: Secondary | ICD-10-CM

## 2012-11-08 LAB — CHROMOGENIC FACTOR X

## 2012-11-27 ENCOUNTER — Ambulatory Visit (HOSPITAL_BASED_OUTPATIENT_CLINIC_OR_DEPARTMENT_OTHER): Payer: BC Managed Care – PPO | Admitting: Pharmacist

## 2012-11-27 ENCOUNTER — Other Ambulatory Visit (HOSPITAL_BASED_OUTPATIENT_CLINIC_OR_DEPARTMENT_OTHER): Payer: BC Managed Care – PPO | Admitting: Lab

## 2012-11-27 DIAGNOSIS — R760 Raised antibody titer: Secondary | ICD-10-CM

## 2012-11-27 DIAGNOSIS — R894 Abnormal immunological findings in specimens from other organs, systems and tissues: Secondary | ICD-10-CM

## 2012-11-27 DIAGNOSIS — I6350 Cerebral infarction due to unspecified occlusion or stenosis of unspecified cerebral artery: Secondary | ICD-10-CM

## 2012-11-27 LAB — POCT INR: INR: 0

## 2012-11-27 NOTE — Progress Notes (Signed)
Chrom Xa level = 50.3% Pt is on 4 mg/day of Coumadin. Missed 1 dose recently per pt.  However I talked w/ pts husband (they are no longer living together he tells me) & he thinks Ayeshia is not taking her Coumadin. Pt reports more EtOH intake; now has 1 glass wine/day. R hip bruised per pt. Will increase Coumadin to 4.5 mg/day. Recheck Xa level next week. Marily Lente, Pharm.D.

## 2012-12-01 ENCOUNTER — Other Ambulatory Visit: Payer: BC Managed Care – PPO

## 2012-12-01 ENCOUNTER — Ambulatory Visit: Payer: BC Managed Care – PPO

## 2012-12-05 ENCOUNTER — Ambulatory Visit (HOSPITAL_BASED_OUTPATIENT_CLINIC_OR_DEPARTMENT_OTHER): Payer: BC Managed Care – PPO | Admitting: Pharmacist

## 2012-12-05 ENCOUNTER — Other Ambulatory Visit (HOSPITAL_BASED_OUTPATIENT_CLINIC_OR_DEPARTMENT_OTHER): Payer: BC Managed Care – PPO | Admitting: Lab

## 2012-12-05 ENCOUNTER — Ambulatory Visit: Payer: BC Managed Care – PPO

## 2012-12-05 DIAGNOSIS — R894 Abnormal immunological findings in specimens from other organs, systems and tissues: Secondary | ICD-10-CM

## 2012-12-05 DIAGNOSIS — R760 Raised antibody titer: Secondary | ICD-10-CM

## 2012-12-05 DIAGNOSIS — I6350 Cerebral infarction due to unspecified occlusion or stenosis of unspecified cerebral artery: Secondary | ICD-10-CM

## 2012-12-05 DIAGNOSIS — I639 Cerebral infarction, unspecified: Secondary | ICD-10-CM

## 2012-12-05 LAB — POCT INR: INR: 0

## 2012-12-05 NOTE — Patient Instructions (Signed)
Continue 4.5 mg daily. Recheck Xa level on 12/19/12 @ 8:30am.

## 2012-12-05 NOTE — Progress Notes (Signed)
Xa = 31.6%. Goal Xa level = 25-35%.  Continue 4.5 mg daily. Recheck Xa level on 12/19/12 @ 8:30am.

## 2012-12-06 LAB — CHROMOGENIC FACTOR X

## 2012-12-19 ENCOUNTER — Other Ambulatory Visit (HOSPITAL_BASED_OUTPATIENT_CLINIC_OR_DEPARTMENT_OTHER): Payer: BC Managed Care – PPO | Admitting: Lab

## 2012-12-19 ENCOUNTER — Ambulatory Visit: Payer: BC Managed Care – PPO | Admitting: Pharmacist

## 2012-12-19 DIAGNOSIS — R894 Abnormal immunological findings in specimens from other organs, systems and tissues: Secondary | ICD-10-CM

## 2012-12-19 DIAGNOSIS — R760 Raised antibody titer: Secondary | ICD-10-CM

## 2012-12-19 NOTE — Progress Notes (Signed)
Chrom Xa level = 32.9% on 4.5 mg/day Pt having nosebleeds every other day.  She says she had a day this past week that her nose bled on & off all day. No vaginal spotting. Pt is ok w/ leaving her Coumadin dose as-is.  If we change her dose down, her Xa level tends to go too high. Recheck Chrom Xa level in 3 weeks. Pt will call if sxs become more concerning. Marily Lente, Pharm.D.

## 2012-12-22 ENCOUNTER — Other Ambulatory Visit (INDEPENDENT_AMBULATORY_CARE_PROVIDER_SITE_OTHER): Payer: BC Managed Care – PPO

## 2012-12-22 DIAGNOSIS — Z0000 Encounter for general adult medical examination without abnormal findings: Secondary | ICD-10-CM

## 2012-12-22 LAB — POCT URINALYSIS DIPSTICK
Bilirubin, UA: NEGATIVE
Ketones, UA: NEGATIVE
Leukocytes, UA: NEGATIVE
Nitrite, UA: NEGATIVE

## 2012-12-22 LAB — TSH: TSH: 2.89 u[IU]/mL (ref 0.35–5.50)

## 2012-12-22 LAB — CBC WITH DIFFERENTIAL/PLATELET
Basophils Absolute: 0 10*3/uL (ref 0.0–0.1)
Eosinophils Absolute: 0.5 10*3/uL (ref 0.0–0.7)
Eosinophils Relative: 11.5 % — ABNORMAL HIGH (ref 0.0–5.0)
HCT: 34.8 % — ABNORMAL LOW (ref 36.0–46.0)
Lymphs Abs: 1.7 10*3/uL (ref 0.7–4.0)
MCHC: 33.6 g/dL (ref 30.0–36.0)
MCV: 83.4 fl (ref 78.0–100.0)
Monocytes Absolute: 0.3 10*3/uL (ref 0.1–1.0)
Neutrophils Relative %: 44.9 % (ref 43.0–77.0)
Platelets: 303 10*3/uL (ref 150.0–400.0)
RDW: 13.9 % (ref 11.5–14.6)
WBC: 4.7 10*3/uL (ref 4.5–10.5)

## 2012-12-22 LAB — BASIC METABOLIC PANEL
BUN: 18 mg/dL (ref 6–23)
CO2: 24 mEq/L (ref 19–32)
Chloride: 109 mEq/L (ref 96–112)
Glucose, Bld: 84 mg/dL (ref 70–99)
Potassium: 3.8 mEq/L (ref 3.5–5.1)

## 2012-12-22 LAB — LIPID PANEL
LDL Cholesterol: 117 mg/dL — ABNORMAL HIGH (ref 0–99)
Total CHOL/HDL Ratio: 4
VLDL: 16.2 mg/dL (ref 0.0–40.0)

## 2012-12-22 LAB — HEPATIC FUNCTION PANEL: Total Bilirubin: 0.7 mg/dL (ref 0.3–1.2)

## 2012-12-22 NOTE — Addendum Note (Signed)
Addended by: Bonnye Fava on: 12/22/2012 10:50 AM   Modules accepted: Orders

## 2012-12-27 NOTE — Progress Notes (Signed)
Quick Note:  I also spoke with pt. ______

## 2012-12-27 NOTE — Progress Notes (Signed)
Quick Note:  Results were released to my chart ______

## 2012-12-29 ENCOUNTER — Ambulatory Visit (INDEPENDENT_AMBULATORY_CARE_PROVIDER_SITE_OTHER): Payer: BC Managed Care – PPO | Admitting: Family Medicine

## 2012-12-29 ENCOUNTER — Encounter: Payer: Self-pay | Admitting: Family Medicine

## 2012-12-29 VITALS — BP 100/68 | HR 82 | Temp 98.2°F | Ht 66.75 in | Wt 137.0 lb

## 2012-12-29 DIAGNOSIS — Z0000 Encounter for general adult medical examination without abnormal findings: Secondary | ICD-10-CM

## 2012-12-29 MED ORDER — LEVOTHYROXINE SODIUM 75 MCG PO TABS: 75.0000 ug | ORAL_TABLET | Freq: Every day | ORAL | Status: DC

## 2012-12-29 NOTE — Progress Notes (Signed)
  Subjective:    Patient ID: Natalie Mullen, female    DOB: 11/19/1966, 47 y.o.   MRN: [id number]  HPI 47 yr old female for a cpx. She feels well and has no concerns. She sees Dr. Cyndie Chime regularly to adjust er Coumadin. He is studying whether she would be a candidate for one of the newer anticoagulants that do not require monitoring. She is active, plays tennis twice a week.    Review of Systems  Constitutional: Negative.   HENT: Negative.   Eyes: Negative.   Respiratory: Negative.   Cardiovascular: Negative.   Gastrointestinal: Negative.   Genitourinary: Negative for dysuria, urgency, frequency, hematuria, flank pain, decreased urine volume, enuresis, difficulty urinating, pelvic pain and dyspareunia.  Musculoskeletal: Negative.   Skin: Negative.   Neurological: Negative.   Hematological: Negative.   Psychiatric/Behavioral: Negative.        Objective:   Physical Exam  Constitutional: She is oriented to person, place, and time. She appears well-developed and well-nourished. No distress.  HENT:  Head: Normocephalic and atraumatic.  Right Ear: External ear normal.  Left Ear: External ear normal.  Nose: Nose normal.  Mouth/Throat: Oropharynx is clear and moist. No oropharyngeal exudate.  Eyes: Conjunctivae normal and EOM are normal. Pupils are equal, round, and reactive to light. No scleral icterus.  Neck: Normal range of motion. Neck supple. No JVD present. No thyromegaly present.  Cardiovascular: Normal rate, regular rhythm, normal heart sounds and intact distal pulses.  Exam reveals no gallop and no friction rub.   No murmur heard. Pulmonary/Chest: Effort normal and breath sounds normal. No respiratory distress. She has no wheezes. She has no rales. She exhibits no tenderness.  Abdominal: Soft. Bowel sounds are normal. She exhibits no distension and no mass. There is no tenderness. There is no rebound and no guarding.  Musculoskeletal: Normal range of motion. She exhibits no  edema and no tenderness.  Lymphadenopathy:    She has no cervical adenopathy.  Neurological: She is alert and oriented to person, place, and time. She has normal reflexes. No cranial nerve deficit. She exhibits normal muscle tone. Coordination normal.  Skin: Skin is warm and dry. No rash noted. No erythema.  Psychiatric: She has a normal mood and affect. Her behavior is normal. Judgment and thought content normal.          Assessment & Plan:  Well exam.

## 2013-01-01 ENCOUNTER — Other Ambulatory Visit: Payer: Self-pay | Admitting: Gynecology

## 2013-01-01 DIAGNOSIS — Z1239 Encounter for other screening for malignant neoplasm of breast: Secondary | ICD-10-CM

## 2013-01-06 ENCOUNTER — Other Ambulatory Visit: Payer: Self-pay

## 2013-01-09 ENCOUNTER — Ambulatory Visit (HOSPITAL_BASED_OUTPATIENT_CLINIC_OR_DEPARTMENT_OTHER): Payer: BC Managed Care – PPO | Admitting: Pharmacist

## 2013-01-09 ENCOUNTER — Other Ambulatory Visit (HOSPITAL_BASED_OUTPATIENT_CLINIC_OR_DEPARTMENT_OTHER): Payer: BC Managed Care – PPO | Admitting: Lab

## 2013-01-09 DIAGNOSIS — I639 Cerebral infarction, unspecified: Secondary | ICD-10-CM

## 2013-01-09 DIAGNOSIS — R894 Abnormal immunological findings in specimens from other organs, systems and tissues: Secondary | ICD-10-CM

## 2013-01-09 DIAGNOSIS — I6350 Cerebral infarction due to unspecified occlusion or stenosis of unspecified cerebral artery: Secondary | ICD-10-CM

## 2013-01-09 LAB — CHROMOGENIC FACTOR X: CHROM XA: 32.2 % (ref 86–146)

## 2013-01-09 NOTE — Progress Notes (Signed)
Chrom Xa level = 32.2%  Pt is taking Coumadin 4.5 mg daily. She has nosebleeds on regular basis.  States she has them after she showers.  They are manageable. Pt is ok w/ continuing same dose of Coumadin. Repeat Xa level in 1 month. Marily Lente, Pharm.D.

## 2013-01-15 ENCOUNTER — Other Ambulatory Visit: Payer: Self-pay | Admitting: Oncology

## 2013-01-18 ENCOUNTER — Telehealth: Payer: Self-pay | Admitting: Oncology

## 2013-01-18 NOTE — Telephone Encounter (Signed)
lm gv appt d/t.Marland Kitchenask pt to call back to confirm..made appt of letter/cal that will be mailed. td

## 2013-01-31 ENCOUNTER — Ambulatory Visit: Payer: BC Managed Care – PPO | Admitting: Lab

## 2013-01-31 ENCOUNTER — Encounter: Payer: Self-pay | Admitting: Pharmacist

## 2013-01-31 DIAGNOSIS — I639 Cerebral infarction, unspecified: Secondary | ICD-10-CM

## 2013-01-31 NOTE — Progress Notes (Signed)
Pt called pharmacy to alert [place] that she has had increasing number of nosebleeds & she thinks her Coumadin dose is too high. She reports in past 3 days she has had 3-4 nosebleeds/day.  She passes 1-2 "fingertip-sized" clots each episode.  She constantly keeps a Kleenex out & when she blows her nose the bleeding returns. I have instructed her to come today for Chrom Xa level at 1:30 pm.  We won't get the result back today.  We can call her tomorrow w/ results & dosing instructions. Tonight she will take Coumadin 4 mg instead of usual 4.5 mg dose. Ebony Hail, Pharm.D., CPP 01/31/2013@11 :00 AM

## 2013-02-01 ENCOUNTER — Ambulatory Visit: Payer: Self-pay | Admitting: Pharmacist

## 2013-02-01 DIAGNOSIS — I639 Cerebral infarction, unspecified: Secondary | ICD-10-CM

## 2013-02-01 DIAGNOSIS — R760 Raised antibody titer: Secondary | ICD-10-CM

## 2013-02-01 NOTE — Progress Notes (Signed)
Xa level = 22.9% which is below goal of 25-35%.  Spoke to pt over phone and she has not had any nose bleeds today.  Will decrease Coumadin to 4mg  daily (about 10%) decrease and pt will keep scheduled appt on 02/06/13 to check Xa level.  Pt knows to call next week to have Xa level checked again if needed.

## 2013-02-05 ENCOUNTER — Ambulatory Visit
Admission: RE | Admit: 2013-02-05 | Discharge: 2013-02-05 | Disposition: A | Discharge status: Home / Self Care | Payer: BC Managed Care – PPO | Source: Ambulatory Visit | Attending: Gynecology | Admitting: Gynecology

## 2013-02-05 DIAGNOSIS — Z1239 Encounter for other screening for malignant neoplasm of breast: Secondary | ICD-10-CM

## 2013-02-06 ENCOUNTER — Ambulatory Visit: Payer: BC Managed Care – PPO | Admitting: Lab

## 2013-02-06 ENCOUNTER — Ambulatory Visit: Payer: BC Managed Care – PPO

## 2013-02-06 DIAGNOSIS — I639 Cerebral infarction, unspecified: Secondary | ICD-10-CM

## 2013-02-07 ENCOUNTER — Ambulatory Visit (HOSPITAL_BASED_OUTPATIENT_CLINIC_OR_DEPARTMENT_OTHER): Payer: BC Managed Care – PPO | Admitting: Pharmacist

## 2013-02-07 DIAGNOSIS — R894 Abnormal immunological findings in specimens from other organs, systems and tissues: Secondary | ICD-10-CM

## 2013-02-07 DIAGNOSIS — I639 Cerebral infarction, unspecified: Secondary | ICD-10-CM

## 2013-02-07 DIAGNOSIS — R760 Raised antibody titer: Secondary | ICD-10-CM

## 2013-02-07 DIAGNOSIS — I6350 Cerebral infarction due to unspecified occlusion or stenosis of unspecified cerebral artery: Secondary | ICD-10-CM

## 2013-02-07 LAB — POCT INR: INR: 0

## 2013-02-07 LAB — CHROMOGENIC FACTOR X

## 2013-02-07 NOTE — Progress Notes (Signed)
Pt recently changed to 4mg  daily with Xa level 23%. Yesterdays Xa = 50.6%. Goal Xa level = 25-35%.  Change Coumadin to 4mg  MWF and 4.5 mg other days hoping for a Xa at goal with next blood draw. Recheck Xa level on 02/22/13 @ 8:30am.

## 2013-02-07 NOTE — Patient Instructions (Signed)
.   Change  Coumadin to 4mg  MWF and 4.5 mg other days Recheck Xa level on 02/22/13 @ 8:30am.

## 2013-02-22 ENCOUNTER — Ambulatory Visit (HOSPITAL_BASED_OUTPATIENT_CLINIC_OR_DEPARTMENT_OTHER): Payer: BC Managed Care – PPO | Admitting: Pharmacist

## 2013-02-22 ENCOUNTER — Encounter: Payer: Self-pay | Admitting: Oncology

## 2013-02-22 ENCOUNTER — Other Ambulatory Visit (HOSPITAL_BASED_OUTPATIENT_CLINIC_OR_DEPARTMENT_OTHER): Payer: BC Managed Care – PPO | Admitting: Lab

## 2013-02-22 DIAGNOSIS — I6350 Cerebral infarction due to unspecified occlusion or stenosis of unspecified cerebral artery: Secondary | ICD-10-CM

## 2013-02-22 DIAGNOSIS — I639 Cerebral infarction, unspecified: Secondary | ICD-10-CM

## 2013-02-22 LAB — CHROMOGENIC FACTOR X

## 2013-02-22 NOTE — Progress Notes (Signed)
Xa=36.5% which slightly above goal.  Will make a small adjustment to coumadin dose to 4.5mg  MWF and 4mg  other days and check Xa level in 2 weeks.

## 2013-03-08 ENCOUNTER — Other Ambulatory Visit (HOSPITAL_BASED_OUTPATIENT_CLINIC_OR_DEPARTMENT_OTHER): Payer: BC Managed Care – PPO | Admitting: Lab

## 2013-03-08 ENCOUNTER — Ambulatory Visit (HOSPITAL_BASED_OUTPATIENT_CLINIC_OR_DEPARTMENT_OTHER): Payer: BC Managed Care – PPO | Admitting: Pharmacist

## 2013-03-08 DIAGNOSIS — I639 Cerebral infarction, unspecified: Secondary | ICD-10-CM

## 2013-03-08 DIAGNOSIS — R760 Raised antibody titer: Secondary | ICD-10-CM

## 2013-03-08 DIAGNOSIS — I6350 Cerebral infarction due to unspecified occlusion or stenosis of unspecified cerebral artery: Secondary | ICD-10-CM

## 2013-03-08 DIAGNOSIS — R894 Abnormal immunological findings in specimens from other organs, systems and tissues: Secondary | ICD-10-CM

## 2013-03-08 LAB — CHROMOGENIC FACTOR X: CHROM XA: 29.8 % — ABNORMAL LOW (ref 86–146)

## 2013-03-08 NOTE — Progress Notes (Signed)
Xa level at goal of 25-35%.  Spoke to Natalie Mullen on phone.  Ms Gerarda Mullen states that she continues to have some nose bleeds but nothing excessive.  Will continue current dose of coumadin 4.5mg  on MWF and 4mg  other days.  Will return for Xa level on 5/13.

## 2013-04-03 ENCOUNTER — Other Ambulatory Visit (HOSPITAL_BASED_OUTPATIENT_CLINIC_OR_DEPARTMENT_OTHER): Payer: BC Managed Care – PPO

## 2013-04-03 DIAGNOSIS — I6350 Cerebral infarction due to unspecified occlusion or stenosis of unspecified cerebral artery: Secondary | ICD-10-CM

## 2013-04-03 DIAGNOSIS — I639 Cerebral infarction, unspecified: Secondary | ICD-10-CM

## 2013-04-03 DIAGNOSIS — D6859 Other primary thrombophilia: Secondary | ICD-10-CM

## 2013-04-03 DIAGNOSIS — R894 Abnormal immunological findings in specimens from other organs, systems and tissues: Secondary | ICD-10-CM

## 2013-04-03 DIAGNOSIS — R760 Raised antibody titer: Secondary | ICD-10-CM

## 2013-04-03 DIAGNOSIS — D6861 Antiphospholipid syndrome: Secondary | ICD-10-CM

## 2013-04-03 LAB — POCT INR: INR: 0

## 2013-04-03 LAB — CBC WITH DIFFERENTIAL/PLATELET
BASO%: 1.4 % (ref 0.0–2.0)
Eosinophils Absolute: 0.8 10*3/uL — ABNORMAL HIGH (ref 0.0–0.5)
LYMPH%: 31.8 % (ref 14.0–49.7)
MCHC: 33.3 g/dL (ref 31.5–36.0)
MONO#: 0.4 10*3/uL (ref 0.1–0.9)
NEUT#: 2 10*3/uL (ref 1.5–6.5)
Platelets: 283 10*3/uL (ref 145–400)
RBC: 4.62 10*6/uL (ref 3.70–5.45)
RDW: 15.4 % — ABNORMAL HIGH (ref 11.2–14.5)
WBC: 5 10*3/uL (ref 3.9–10.3)

## 2013-04-03 LAB — COMPREHENSIVE METABOLIC PANEL (CC13)
ALT: 51 U/L (ref 0–55)
AST: 49 U/L — ABNORMAL HIGH (ref 5–34)
Albumin: 3.3 g/dL — ABNORMAL LOW (ref 3.5–5.0)
BUN: 13.3 mg/dL (ref 7.0–26.0)
CO2: 24 mEq/L (ref 22–29)
Calcium: 9 mg/dL (ref 8.4–10.4)
Chloride: 105 mEq/L (ref 98–107)
Creatinine: 0.9 mg/dL (ref 0.6–1.1)
Potassium: 4 mEq/L (ref 3.5–5.1)

## 2013-04-05 ENCOUNTER — Ambulatory Visit (HOSPITAL_BASED_OUTPATIENT_CLINIC_OR_DEPARTMENT_OTHER): Payer: BC Managed Care – PPO | Admitting: Pharmacist

## 2013-04-05 DIAGNOSIS — I6350 Cerebral infarction due to unspecified occlusion or stenosis of unspecified cerebral artery: Secondary | ICD-10-CM

## 2013-04-05 DIAGNOSIS — R760 Raised antibody titer: Secondary | ICD-10-CM

## 2013-04-05 DIAGNOSIS — I639 Cerebral infarction, unspecified: Secondary | ICD-10-CM

## 2013-04-05 DIAGNOSIS — R894 Abnormal immunological findings in specimens from other organs, systems and tissues: Secondary | ICD-10-CM

## 2013-04-05 NOTE — Progress Notes (Signed)
Chrom Xa level = 38.5% drawn 04/03/13; result received late since processing mechanism failure. Pt has had some nose bleeding still. She missed a dose of Coumadin 4 days ago.  This may be reason her Chrom Xa level is a little above goal. I will not change her Coumadin dose.  Repeat lab on 04/30/13 when she is back for lab/MD visit. Pt agrees w/ plan. NO CHARGE- phone encounter Ebony Hail, Pharm.D., CPP 04/05/2013@3 :50 PM

## 2013-04-06 ENCOUNTER — Ambulatory Visit (INDEPENDENT_AMBULATORY_CARE_PROVIDER_SITE_OTHER): Payer: BC Managed Care – PPO | Admitting: Family Medicine

## 2013-04-06 ENCOUNTER — Telehealth: Payer: Self-pay | Admitting: Family Medicine

## 2013-04-06 ENCOUNTER — Other Ambulatory Visit: Payer: BC Managed Care – PPO | Admitting: Lab

## 2013-04-06 ENCOUNTER — Encounter: Payer: Self-pay | Admitting: Family Medicine

## 2013-04-06 ENCOUNTER — Ambulatory Visit: Payer: BC Managed Care – PPO | Admitting: Oncology

## 2013-04-06 VITALS — BP 102/66 | HR 82 | Temp 98.0°F | Wt 140.0 lb

## 2013-04-06 DIAGNOSIS — R300 Dysuria: Secondary | ICD-10-CM

## 2013-04-06 DIAGNOSIS — R309 Painful micturition, unspecified: Secondary | ICD-10-CM

## 2013-04-06 DIAGNOSIS — N390 Urinary tract infection, site not specified: Secondary | ICD-10-CM

## 2013-04-06 LAB — POCT URINALYSIS DIPSTICK
Glucose, UA: NEGATIVE
Nitrite, UA: NEGATIVE
Spec Grav, UA: >=1.03
Urobilinogen, UA: 0.2

## 2013-04-06 LAB — CHROMOGENIC FACTOR X: CHROM XA: 38.5 % — ABNORMAL LOW (ref 86–146)

## 2013-04-06 MED ORDER — CIPROFLOXACIN HCL 500 MG PO TABS: 500.0000 mg | ORAL_TABLET | Freq: Two times a day (BID) | ORAL | Status: DC

## 2013-04-06 NOTE — Telephone Encounter (Signed)
Patient Information:  Caller Name: Natalie Mullen  Phone: [phone]  Patient: Natalie Mullen  Gender: Female  DOB: 17-Mar-1966  Age: 47 Years  PCP: Gershon Crane [facility])  Pregnant: No  Office Follow Up:  Does the office need to follow up with this patient?: No  Instructions For The Office: N/A   Symptoms  Reason For Call & Symptoms: Urinary Sx onset 04/04/13. Having slight pain and urgency. Takes Coumaden daily.  Reviewed Health History In EMR: Yes  Reviewed Medications In EMR: Yes  Reviewed Allergies In EMR: Yes  Reviewed Surgeries / Procedures: Yes  Date of Onset of Symptoms: 04/04/2013 OB / GYN:  LMP: 03/16/2013  Guideline(s) Used:  Urination Pain - Female  Disposition Per Guideline:   See Today in Office  Reason For Disposition Reached:   Painful urination AND EITHER frequency or urgency  Advice Given:  Cranberry Juice:   Some people think that drinking cranberry juice may help in fighting urinary tract infections. However, there is no good research that has ever proved this.  Dosage Cranberry Juice Cocktail: 8 oz (240 ml) twice a day.  Fluids:   Drink extra fluids. Drink 8-10 glasses of liquids a day (Reason: to produce a dilute, non-irritating urine).  Warm Saline SITZ Baths to Reduce Pain:  Sit in a warm saline bath for 20 minutes to cleanse the area and to reduce pain. Add 2 oz. of table salt or baking soda to a tub of water.  Call Back If:  You become worse.  Patient Will Follow Care Advice:  YES  Appointment Scheduled:  04/06/2013 13:30:00 Appointment Scheduled Provider:  Gershon Crane [facility])

## 2013-04-06 NOTE — Progress Notes (Signed)
  Subjective:    Patient ID: Natalie Mullen, female    DOB: 1966/05/14, 47 y.o.   MRN: [id number]  HPI Here for 2 days of urinary urgency and burning. No nausea or fever.    Review of Systems  Constitutional: Negative.   Respiratory: Negative.   Cardiovascular: Negative.   Gastrointestinal: Negative.   Genitourinary: Positive for dysuria, urgency and frequency.       Objective:   Physical Exam  Constitutional: She appears well-developed and well-nourished.  Abdominal: Soft. Bowel sounds are normal. She exhibits no distension and no mass. There is no tenderness. There is no rebound and no guarding.          Assessment & Plan:  Start on Cipro. Drink fluids. Get a culture.

## 2013-04-09 LAB — URINE CULTURE: Colony Count: >=100000

## 2013-04-11 NOTE — Progress Notes (Signed)
Quick Note:  I left voice message with results. ______ 

## 2013-04-30 ENCOUNTER — Telehealth: Payer: Self-pay | Admitting: Oncology

## 2013-04-30 ENCOUNTER — Ambulatory Visit (HOSPITAL_BASED_OUTPATIENT_CLINIC_OR_DEPARTMENT_OTHER): Payer: BC Managed Care – PPO | Admitting: Pharmacist

## 2013-04-30 ENCOUNTER — Ambulatory Visit (HOSPITAL_BASED_OUTPATIENT_CLINIC_OR_DEPARTMENT_OTHER): Payer: BC Managed Care – PPO | Admitting: Oncology

## 2013-04-30 ENCOUNTER — Other Ambulatory Visit: Payer: BC Managed Care – PPO | Admitting: Lab

## 2013-04-30 VITALS — BP 102/67 | HR 62 | Temp 97.0°F | Resp 18 | Ht 66.0 in | Wt 139.5 lb

## 2013-04-30 DIAGNOSIS — R894 Abnormal immunological findings in specimens from other organs, systems and tissues: Secondary | ICD-10-CM

## 2013-04-30 DIAGNOSIS — D6859 Other primary thrombophilia: Secondary | ICD-10-CM

## 2013-04-30 DIAGNOSIS — I639 Cerebral infarction, unspecified: Secondary | ICD-10-CM

## 2013-04-30 DIAGNOSIS — R760 Raised antibody titer: Secondary | ICD-10-CM

## 2013-04-30 DIAGNOSIS — I6350 Cerebral infarction due to unspecified occlusion or stenosis of unspecified cerebral artery: Secondary | ICD-10-CM

## 2013-04-30 DIAGNOSIS — D6861 Antiphospholipid syndrome: Secondary | ICD-10-CM

## 2013-04-30 LAB — POCT INR: INR: 0

## 2013-04-30 NOTE — Telephone Encounter (Signed)
Gave pt appt for lab and MD for December 2014 °

## 2013-05-01 ENCOUNTER — Ambulatory Visit (HOSPITAL_BASED_OUTPATIENT_CLINIC_OR_DEPARTMENT_OTHER): Payer: Self-pay | Admitting: Pharmacist

## 2013-05-01 DIAGNOSIS — R894 Abnormal immunological findings in specimens from other organs, systems and tissues: Secondary | ICD-10-CM

## 2013-05-01 DIAGNOSIS — I6350 Cerebral infarction due to unspecified occlusion or stenosis of unspecified cerebral artery: Secondary | ICD-10-CM

## 2013-05-01 DIAGNOSIS — I639 Cerebral infarction, unspecified: Secondary | ICD-10-CM

## 2013-05-01 DIAGNOSIS — R760 Raised antibody titer: Secondary | ICD-10-CM

## 2013-05-01 NOTE — Patient Instructions (Signed)
Increase Coumadin to 4.5mg  daily. Recheck Xa level on 05/08/13.

## 2013-05-01 NOTE — Progress Notes (Signed)
Hematology and Oncology Follow Up Visit  Natalie Mullen [id number] [birth year] 47 y.o. 05/01/2013 8:49 AM   Principle Diagnosis: Encounter Diagnoses  Name Primary?  . CVA (cerebral vascular accident) Yes  . Antiphospholipid antibody with hypercoagulable state      Interim History:   Followup visit for this pleasant 47 year old woman on chronic Coumadin anticoagulation subsequent to an acute left middle cerebral artery stroke which occurred in July 2011 with subsequent findings of antiphospholipid antibody syndrome. Please see my office progress note dated 03/27/2012 for additional details.  She does not feel that she has  any residual neurologic deficits. She is bilingual. She occasionally has trouble calling to mind a word in [place] but otherwise she is fully functional, she is working, she plays tennis. She is not having any dysarthria, no focal weakness, no ataxia.  Unfortunately her husband feels that there have been changes in her personality and they are now separated. These changes must be quite subtle because they are not obvious to a casual observer.  Medications: reviewed  Allergies: No Known Allergies  Review of Systems: Constitutional:   No constitutional symptoms Respiratory: No cough or dyspnea Cardiovascular:  No chest pain or palpitations Gastrointestinal: No change in bowel habit Genito-Urinary: Menstrual cycles are starting to become irregular. No heavy bleeding however. Musculoskeletal: No muscle or bone pain Neurologic: See above Skin: No rash or ecchymosis Miscellaneous: Occasional mild epistaxis self-limited. Remaining ROS negative.  Physical Exam: Blood pressure 102/67, pulse 62, temperature 97 F (36.1 C), temperature source Oral, resp. rate 18, height 5\' 6"  (1.676 m), weight 139 lb 8 oz (63.277 kg). Wt Readings from Last 3 Encounters:  04/30/13 139 lb 8 oz (63.277 kg)  04/06/13 140 lb (63.504 kg)  12/29/12 137 lb (62.143 kg)     General  appearance: Well-nourished woman appears younger than stated age HENNT: Pharynx no erythema or exudate Lymph nodes: No adenopathy Breasts: Lungs: Clear to auscultation resonant to percussion Heart: Regular rhythm no murmur Abdomen: Soft, nontender, no mass, no organomegaly Extremities: No edema, no calf tenderness Musculoskeletal: No joint deformities GU: Vascular: No carotid bruits, no cyanosis Neurologic: Mental status intact, cranial nerves grossly normal, PERRLA, optic disc sharp on the right, not well visualized on the left due to technical reasons, motor strength 5 over 5, reflexes 1+ symmetric, upper body coordination, finger to finger, finger hand, gait, all normal. Skin: Patchy Vitiligo on the face  Lab Results: Lab Results  Component Value Date   WBC 5.0 04/03/2013   HGB 12.8 04/03/2013   HCT 38.5 04/03/2013   MCV 83.4 04/03/2013   PLT 283 04/03/2013     Chemistry      Component Value Date/Time   NA 138 04/03/2013 0849   NA 137 12/22/2012 0835   K 4.0 04/03/2013 0849   K 3.8 12/22/2012 0835   CL 105 04/03/2013 0849   CL 109 12/22/2012 0835   CO2 24 04/03/2013 0849   CO2 24 12/22/2012 0835   BUN 13.3 04/03/2013 0849   BUN 18 12/22/2012 0835   CREATININE 0.9 04/03/2013 0849   CREATININE 0.9 12/22/2012 0835      Component Value Date/Time   CALCIUM 9.0 04/03/2013 0849   CALCIUM 8.8 12/22/2012 0835   ALKPHOS 95 04/03/2013 0849   ALKPHOS 57 12/22/2012 0835   AST 49* 04/03/2013 0849   AST 18 12/22/2012 0835   ALT 51 04/03/2013 0849   ALT 17 12/22/2012 0835   BILITOT 0.46 04/03/2013 0849   BILITOT 0.7 12/22/2012 2130  Impression:  Plan: #1. Antiphospholipid antibody syndrome  #2. Left MCA stroke secondary to #1 Neurologically stable since intervention at time of stroke back in 2011.  #3. Chronic anticoagulation secondary to #1 and #2 above  #4. Mild dysphagia likely minor cranial nerve dysfunction from previous stroke  #5. Hypothyroid on replacement   CC:. Dr. Shellia Carwin   Levert Feinstein, MD 6/10/20148:49 AM

## 2013-05-01 NOTE — Progress Notes (Signed)
Xa level well above goal from 04/30/13. Xa level slightly elevated on 04/05/13 @ 38.5%. Pt missed one dose of coumadin in the last week. She believes it was on last Thursday (6/5). In general, pt may be eating slightly less amounts of food.  No other changes to report. No problems to report regarding anticoagulation. Will increase Coumadin to 4.5mg  daily. Recheck Xa level in 1 week on 05/08/13. No charge visit. The communication occurred by telephone.

## 2013-05-02 NOTE — Progress Notes (Signed)
See Anti-coag note from 05/01/13 as f/u for Xa level by Anola Gurney, Pharm.D. NO CHARGE - this note is documentation only to close encounter from 04/30/13 when pt was here for MD visit.  We did not have her Xa level result at the time of her visit in Coumadin clinic 04/30/13. Ebony Hail, Pharm.D., CPP 05/02/2013@8 :31 AM

## 2013-05-08 ENCOUNTER — Other Ambulatory Visit: Payer: BC Managed Care – PPO | Admitting: Lab

## 2013-05-08 ENCOUNTER — Ambulatory Visit: Payer: BC Managed Care – PPO

## 2013-05-08 DIAGNOSIS — I639 Cerebral infarction, unspecified: Secondary | ICD-10-CM

## 2013-05-09 ENCOUNTER — Ambulatory Visit: Payer: Self-pay | Admitting: Pharmacist

## 2013-05-09 DIAGNOSIS — R760 Raised antibody titer: Secondary | ICD-10-CM

## 2013-05-09 DIAGNOSIS — I639 Cerebral infarction, unspecified: Secondary | ICD-10-CM

## 2013-05-09 NOTE — Progress Notes (Signed)
Chrom Xa level = 40.7% drawn 05/08/13 (down from 66.2% last week) Pt has been taking Coumadin 4.5 mg daily without missing doses.   She doesn't mention nosebleeds today when we spoke over the phone. Pt did ask if she could switch to Xarelto.  I explained that she may not be a good candidate for this drug given her high risk APLA syndrome. Xa level is still elevated.  We have had trouble exceeding 4.5 mg/day w/ Delia Heady as she tends to have epistaxis at this dose. We'll leave her dose the same & recheck her Xa level again next week to give it time to drop within range.  Dr. Cyndie Chime is aware & agrees w/ plan. NO CHARGE.  Phone encounter. Ebony Hail, Pharm.D., CPP 05/09/2013@2 :11 PM

## 2013-05-14 ENCOUNTER — Encounter: Payer: Self-pay | Admitting: Pharmacist

## 2013-05-14 NOTE — Progress Notes (Signed)
Pt sent me a text message on Saturday 05/12/13 stating she forgot to take her Coumadin on Friday 05/11/13. I instructed pt to take 5 mg on Saturday only then back to her usual dose. She returns tomorrow 05/15/13 for Xa level. Ebony Hail, Pharm.D., CPP 05/14/2013@1 :21 PM

## 2013-05-15 ENCOUNTER — Ambulatory Visit: Payer: BC Managed Care – PPO

## 2013-05-15 ENCOUNTER — Other Ambulatory Visit: Payer: BC Managed Care – PPO | Admitting: Lab

## 2013-05-15 DIAGNOSIS — I639 Cerebral infarction, unspecified: Secondary | ICD-10-CM

## 2013-05-16 ENCOUNTER — Ambulatory Visit (HOSPITAL_BASED_OUTPATIENT_CLINIC_OR_DEPARTMENT_OTHER): Payer: Self-pay | Admitting: Pharmacist

## 2013-05-16 DIAGNOSIS — I6350 Cerebral infarction due to unspecified occlusion or stenosis of unspecified cerebral artery: Secondary | ICD-10-CM

## 2013-05-16 DIAGNOSIS — R760 Raised antibody titer: Secondary | ICD-10-CM

## 2013-05-16 DIAGNOSIS — R894 Abnormal immunological findings in specimens from other organs, systems and tissues: Secondary | ICD-10-CM

## 2013-05-16 DIAGNOSIS — I639 Cerebral infarction, unspecified: Secondary | ICD-10-CM

## 2013-05-16 NOTE — Patient Instructions (Signed)
Xa = 34.7% Goal Xa level = 25-35%.  Continue Coumadin to 4.5mg  daily. Recheck Xa level on 06/05/13.

## 2013-05-16 NOTE — Progress Notes (Signed)
Spoke with patient over phone regarding her Xa level of 34.7% She is currently on Coumadin 4.5 mg daily No changes to report Will continue same dose and recheck her level on July 15 at 8:30

## 2013-06-05 ENCOUNTER — Other Ambulatory Visit (HOSPITAL_BASED_OUTPATIENT_CLINIC_OR_DEPARTMENT_OTHER): Payer: BC Managed Care – PPO | Admitting: Lab

## 2013-06-05 ENCOUNTER — Ambulatory Visit (HOSPITAL_BASED_OUTPATIENT_CLINIC_OR_DEPARTMENT_OTHER): Payer: BC Managed Care – PPO | Admitting: Pharmacist

## 2013-06-05 DIAGNOSIS — I6350 Cerebral infarction due to unspecified occlusion or stenosis of unspecified cerebral artery: Secondary | ICD-10-CM

## 2013-06-05 DIAGNOSIS — I639 Cerebral infarction, unspecified: Secondary | ICD-10-CM

## 2013-06-05 DIAGNOSIS — R894 Abnormal immunological findings in specimens from other organs, systems and tissues: Secondary | ICD-10-CM

## 2013-06-05 LAB — CHROMOGENIC FACTOR X: CHROM XA: 24.5 % (ref 86–146)

## 2013-06-05 LAB — POCT INR: INR: 0

## 2013-06-05 NOTE — Progress Notes (Signed)
Xa level at goal. Goal Xa level = 25 - 35% No medication changes. No missed doses of coumadin. No bruising. Minor increase in frequency of nosebleeds - only "slightly more" per patient report. Pt also has had small amounts of vaginal spotting. Pt has not been eating as many greens (salads and vegetables) as she would like. She plans to incorporate more Vitamin K in her diet over the next week. She will also call us if any of her symptoms worsen. Continue Coumadin 4.5mg  daily. Recheck Xa level in 1 week on 06/12/13. Spoke with pt by telephone. No charge.

## 2013-06-05 NOTE — Patient Instructions (Addendum)
Xa = 24.5% Goal Xa level = 25-35%.  Continue Coumadin 4.5mg  daily. Recheck Xa level on 06/12/13.

## 2013-06-12 ENCOUNTER — Ambulatory Visit: Payer: BC Managed Care – PPO

## 2013-06-12 ENCOUNTER — Telehealth: Payer: Self-pay | Admitting: Pharmacist

## 2013-06-12 ENCOUNTER — Other Ambulatory Visit (HOSPITAL_BASED_OUTPATIENT_CLINIC_OR_DEPARTMENT_OTHER): Payer: BC Managed Care – PPO | Admitting: Lab

## 2013-06-12 DIAGNOSIS — I6350 Cerebral infarction due to unspecified occlusion or stenosis of unspecified cerebral artery: Secondary | ICD-10-CM

## 2013-06-12 DIAGNOSIS — I639 Cerebral infarction, unspecified: Secondary | ICD-10-CM

## 2013-06-12 LAB — POCT INR: INR: 0

## 2013-06-12 LAB — CHROMOGENIC FACTOR X: CHROM XA: 21.3 % (ref 86–146)

## 2013-06-13 ENCOUNTER — Ambulatory Visit: Payer: Self-pay | Admitting: Pharmacist

## 2013-06-13 ENCOUNTER — Telehealth: Payer: Self-pay | Admitting: Pharmacist

## 2013-06-13 DIAGNOSIS — R760 Raised antibody titer: Secondary | ICD-10-CM

## 2013-06-13 DIAGNOSIS — I639 Cerebral infarction, unspecified: Secondary | ICD-10-CM

## 2013-06-13 NOTE — Progress Notes (Signed)
Chrom Xa level = 21.3% (drawn 06/12/13); goal is 25-35% Pt has been on Coumadin 4.5 mg daily & been within goal range for ~1 month now. I s/w pt over phone today.  She denies diet changes/increased EtOH intake. She has some minor bruising.  Her eye is "pink".  It is not "blood-shot."  She noticed this change yesterday. Chrom Xa level is low.  I had her take 4 mg yesterday (7/22; I was able to text her yesterday to make the dose change) & 4 mg today (7/23) only then go back on 4.5 mg daily. Pt requested to be seen on 06/26/13 (2 weeks out).  If her Xa level is again low at the next visit, we may need to adjust her doses to include a few days at 4 mg. She will monitor her eye & if it worsens, she will call us. Ebony Hail, Pharm.D., CPP 06/13/2013@10 :32 AM

## 2013-06-14 ENCOUNTER — Encounter: Payer: Self-pay | Admitting: Family Medicine

## 2013-06-14 ENCOUNTER — Ambulatory Visit (INDEPENDENT_AMBULATORY_CARE_PROVIDER_SITE_OTHER): Payer: BC Managed Care – PPO | Admitting: Family Medicine

## 2013-06-14 ENCOUNTER — Encounter: Payer: Self-pay | Admitting: Pharmacist

## 2013-06-14 ENCOUNTER — Other Ambulatory Visit: Payer: Self-pay | Admitting: Pharmacist

## 2013-06-14 VITALS — BP 120/80 | HR 61 | Temp 98.1°F | Wt 142.0 lb

## 2013-06-14 DIAGNOSIS — I639 Cerebral infarction, unspecified: Secondary | ICD-10-CM

## 2013-06-14 DIAGNOSIS — R350 Frequency of micturition: Secondary | ICD-10-CM

## 2013-06-14 DIAGNOSIS — N390 Urinary tract infection, site not specified: Secondary | ICD-10-CM

## 2013-06-14 LAB — POCT URINALYSIS DIPSTICK
Nitrite, UA: NEGATIVE
pH, UA: 6

## 2013-06-14 MED ORDER — WARFARIN SODIUM 4 MG PO TABS: ORAL_TABLET | ORAL | Status: DC

## 2013-06-14 MED ORDER — CIPROFLOXACIN HCL 500 MG PO TABS: 500.0000 mg | ORAL_TABLET | Freq: Two times a day (BID) | ORAL | Status: DC

## 2013-06-14 MED ORDER — WARFARIN SODIUM 1 MG PO TABS: ORAL_TABLET | ORAL | Status: DC

## 2013-06-14 NOTE — Progress Notes (Signed)
  Subjective:    Patient ID: Natalie Mullen, female    DOB: 02-09-1966, 47 y.o.   MRN: [id number]  HPI Here for 2 days of urinary pressure and urgency. No fever or nausea. She drinks plenty of water.    Review of Systems  Constitutional: Negative.   Gastrointestinal: Negative.   Genitourinary: Positive for urgency and frequency. Negative for dysuria and flank pain.       Objective:   Physical Exam  Constitutional: She appears well-developed and well-nourished.  Abdominal: Soft. Bowel sounds are normal. She exhibits no distension and no mass. There is no tenderness. There is no rebound and no guarding.          Assessment & Plan:  Culture the sample.

## 2013-06-14 NOTE — Progress Notes (Signed)
Pt sent me text message this afternoon requesting a refill on her Coumadin 4 mg & 1 mg tablets. In the process of refilling them for her, I noticed she is now on Cipro. Pt confirmed by text message that she woke up today & felt like she had a UTI so she went to her PCP & was rx'd Cipro x 10 day course. Pt confirmed she did take Coumadin 4 mg x 2 days as instructed & today she restarts 4.5 mg daily. She states her eye looks "fine" today (yesterday it was "pink"). Cipro may enhance the anticoagulant effects of Warfarin so I have moved her next appt (06/26/13) forward to 06/21/13 at 8:30 am for lab/ 8:45 am for CC. Pt aware of plan. Ebony Hail, Pharm.D., CPP 06/14/2013@3 :31 PM

## 2013-06-18 NOTE — Progress Notes (Signed)
Quick Note:  I released results in my chart. ______ 

## 2013-06-21 ENCOUNTER — Ambulatory Visit: Payer: BC Managed Care – PPO

## 2013-06-21 ENCOUNTER — Telehealth: Payer: Self-pay | Admitting: Pharmacist

## 2013-06-21 ENCOUNTER — Other Ambulatory Visit (HOSPITAL_BASED_OUTPATIENT_CLINIC_OR_DEPARTMENT_OTHER): Payer: BC Managed Care – PPO | Admitting: Lab

## 2013-06-21 DIAGNOSIS — I639 Cerebral infarction, unspecified: Secondary | ICD-10-CM

## 2013-06-21 DIAGNOSIS — I6350 Cerebral infarction due to unspecified occlusion or stenosis of unspecified cerebral artery: Secondary | ICD-10-CM

## 2013-06-21 LAB — CHROMOGENIC FACTOR X: CHROM XA: 22.7 % — ABNORMAL LOW (ref 86–146)

## 2013-06-21 NOTE — Telephone Encounter (Signed)
Leftt VM that Xa level = 22.7 goal 25-35% and to start coumadin 4mg  daily while on Cipro.  Instructed pt to call us back.  Will call back tomorrow if we don't hear from Ms Natalie Mullen.

## 2013-07-03 ENCOUNTER — Ambulatory Visit: Payer: BC Managed Care – PPO

## 2013-07-03 ENCOUNTER — Other Ambulatory Visit: Payer: BC Managed Care – PPO | Admitting: Lab

## 2013-07-03 DIAGNOSIS — I639 Cerebral infarction, unspecified: Secondary | ICD-10-CM

## 2013-07-04 ENCOUNTER — Ambulatory Visit: Payer: Self-pay | Admitting: Pharmacist

## 2013-07-04 DIAGNOSIS — I639 Cerebral infarction, unspecified: Secondary | ICD-10-CM

## 2013-07-04 DIAGNOSIS — R760 Raised antibody titer: Secondary | ICD-10-CM

## 2013-07-04 NOTE — Progress Notes (Signed)
Spoke to pt on telephone.  No charge  Xa level=27.4% at goal on coumadin 4.5mg  daily.  Ms Natalie Mullen has been on this coumadin dose for > 3 months except around Ciprofloxacin course.  She only reports a minor nosebleed this am.  Will continue current coumadin dose and check Xa level in 1 month.  Ms Natalie Mullen knows to call with any medication changes, including addition of antibiotics.

## 2013-08-02 ENCOUNTER — Other Ambulatory Visit: Payer: BC Managed Care – PPO | Admitting: Lab

## 2013-08-02 DIAGNOSIS — I639 Cerebral infarction, unspecified: Secondary | ICD-10-CM

## 2013-08-02 LAB — POCT INR: INR: 0

## 2013-08-03 ENCOUNTER — Ambulatory Visit (HOSPITAL_BASED_OUTPATIENT_CLINIC_OR_DEPARTMENT_OTHER): Payer: BC Managed Care – PPO | Admitting: Pharmacist

## 2013-08-03 DIAGNOSIS — I6350 Cerebral infarction due to unspecified occlusion or stenosis of unspecified cerebral artery: Secondary | ICD-10-CM

## 2013-08-03 DIAGNOSIS — I639 Cerebral infarction, unspecified: Secondary | ICD-10-CM

## 2013-08-03 DIAGNOSIS — R894 Abnormal immunological findings in specimens from other organs, systems and tissues: Secondary | ICD-10-CM

## 2013-08-03 DIAGNOSIS — R760 Raised antibody titer: Secondary | ICD-10-CM

## 2013-08-03 NOTE — Patient Instructions (Addendum)
Slightly decrease coumadin to 4.5mg  daily except 4mg  on Tue&Fri.   Recheck INR in 2 weeks on 08/20/13: lab at 8:30am.

## 2013-08-03 NOTE — Progress Notes (Signed)
Xa level below goal today. Xa level from (08/02/13) = 20% Goal Xa level = 25-35% No additional problems to report regarding anticoagulation. No increase in severity/frequency of nosebleeds. Occasional bruising. Her menstrual cycle is lasting longer. No changes in diet or medications. No missed doses. Pt is leaving for [place] on 9/15 - 08/15/13. She is going to visit her mother and will be gone for 10 days.   Her nosebleeds seem to increase in severity/frequency due to the altitude while she is in [place]. Pt would like to decrease her coumadin dose while in [place]. Slightly decrease coumadin to 4.5mg  daily except 4mg  on Tue&Fri.   Recheck INR in 2 weeks on 08/20/13: lab at 8:30am. Pt has our phone number to call us with any problems.  She can be reached at [phone] while in [place]. Spoke with patient by telephone - no charge encounter.

## 2013-08-16 ENCOUNTER — Encounter: Payer: Self-pay | Admitting: Family Medicine

## 2013-08-16 ENCOUNTER — Encounter: Payer: Self-pay | Admitting: Pharmacist

## 2013-08-16 ENCOUNTER — Ambulatory Visit (INDEPENDENT_AMBULATORY_CARE_PROVIDER_SITE_OTHER): Payer: BC Managed Care – PPO | Admitting: Family Medicine

## 2013-08-16 ENCOUNTER — Ambulatory Visit: Payer: BC Managed Care – PPO | Admitting: Family Medicine

## 2013-08-16 VITALS — BP 100/66 | HR 90 | Temp 97.7°F | Wt 140.0 lb

## 2013-08-16 DIAGNOSIS — R5381 Other malaise: Secondary | ICD-10-CM

## 2013-08-16 DIAGNOSIS — N390 Urinary tract infection, site not specified: Secondary | ICD-10-CM

## 2013-08-16 DIAGNOSIS — R531 Weakness: Secondary | ICD-10-CM

## 2013-08-16 LAB — POCT URINALYSIS DIPSTICK
Glucose, UA: NEGATIVE
Ketones, UA: NEGATIVE
Leukocytes, UA: NEGATIVE
Nitrite, UA: NEGATIVE
Spec Grav, UA: 1.02
Urobilinogen, UA: 4
pH, UA: 5.5

## 2013-08-16 LAB — BASIC METABOLIC PANEL
BUN: 10 mg/dL (ref 6–23)
Calcium: 9.4 mg/dL (ref 8.4–10.5)
Chloride: 100 mEq/L (ref 96–112)
GFR: 83.28 mL/min (ref >60.00)
Glucose, Bld: 52 mg/dL — ABNORMAL LOW (ref 70–99)
Potassium: 3.7 mEq/L (ref 3.5–5.1)
Sodium: 134 mEq/L — ABNORMAL LOW (ref 135–145)

## 2013-08-16 LAB — CBC WITH DIFFERENTIAL/PLATELET
Basophils Absolute: 0 10*3/uL (ref 0.0–0.1)
Eosinophils Absolute: 0.1 10*3/uL (ref 0.0–0.7)
Eosinophils Relative: 1.8 % (ref 0.0–5.0)
HCT: 42.6 % (ref 36.0–46.0)
Lymphocytes Relative: 27.6 % (ref 12.0–46.0)
Lymphs Abs: 1.8 10*3/uL (ref 0.7–4.0)
MCHC: 33.1 g/dL (ref 30.0–36.0)
MCV: 86.8 fl (ref 78.0–100.0)
Monocytes Absolute: 0.8 10*3/uL (ref 0.1–1.0)
Neutrophils Relative %: 57.8 % (ref 43.0–77.0)
Platelets: 290 10*3/uL (ref 150.0–400.0)
RDW: 14.4 % (ref 11.5–14.6)
WBC: 6.6 10*3/uL (ref 4.5–10.5)

## 2013-08-16 LAB — HEPATIC FUNCTION PANEL
ALT: 1640 U/L — ABNORMAL HIGH (ref 0–35)
AST: 1310 U/L — ABNORMAL HIGH (ref 0–37)
Albumin: 3.6 g/dL (ref 3.5–5.2)
Alkaline Phosphatase: 115 U/L (ref 39–117)
Bilirubin, Direct: 1.1 mg/dL — ABNORMAL HIGH (ref 0.0–0.3)
Total Bilirubin: 2.6 mg/dL — ABNORMAL HIGH (ref 0.3–1.2)
Total Protein: 7.8 g/dL (ref 6.0–8.3)

## 2013-08-16 LAB — POCT URINE PREGNANCY: Preg Test, Ur: NEGATIVE

## 2013-08-16 MED ORDER — SULFAMETHOXAZOLE-TMP DS 800-160 MG PO TABS: 1.0000 | ORAL_TABLET | Freq: Two times a day (BID) | ORAL | Status: DC

## 2013-08-16 NOTE — Progress Notes (Signed)
Pt called me on my cell phone to inform me she had been to her PCP today.  She feels "lousy."  The PCP (Dr. Abran Cantor) RX'd Bactrim for possible pyelonephritis. Pts urine had some blood in it at the PCP office. Fleda just returned home from [place].  She said her nose bled while traveling & she has taken Coumadin 4 mg/day for the past 5-6 days. I advised her to call Dr. Laurice Record office to see if there is an alternative to Bactrim to treat her pyelonephritis (suggested Augmentin or Cipro) but if she gets no response to her call, go ahead & take 1 dose of Bactrim tonight. She will take Coumadin 2 mg (1/2 dose) tonight. She will come first thing tomorrow AM for Chrom Xa level.  We can see her in the Coumadin clinic tomorrow if she needs to be seen. Ebony Hail, Pharm.D., CPP 08/16/2013@3 :33 PM

## 2013-08-16 NOTE — Progress Notes (Signed)
  Subjective:    Patient ID: Natalie Mullen, female    DOB: Oct 23, 1966, 47 y.o.   MRN: [id number]  HPI Here for 4 days of ill defined symptoms like fatigue and  poor appetite. She just returned from a 9 day trip to [place], [place] to visit family. She denies any ST or sinus pressure or cough. No fever. No NVD. No urinary changes (although she has a hx of frequent UTIs). Her last menstrual cycle was 4 weeks ago.    Review of Systems  Constitutional: Positive for appetite change and fatigue. Negative for fever, chills, diaphoresis and unexpected weight change.  HENT: Negative.   Eyes: Negative.   Respiratory: Negative.   Cardiovascular: Negative.   Gastrointestinal: Negative.   Endocrine: Negative.   Genitourinary: Negative.   Neurological: Negative.        Objective:   Physical Exam  Constitutional: She appears well-developed and well-nourished. No distress.  Eyes: Conjunctivae are normal. Pupils are equal, round, and reactive to light.  Neck: Neck supple. No thyromegaly present.  Cardiovascular: Normal rate, regular rhythm, normal heart sounds and intact distal pulses.   Pulmonary/Chest: Effort normal and breath sounds normal.  Abdominal: Soft. Bowel sounds are normal. She exhibits no distension and no mass. There is no tenderness. There is no rebound and no guarding.  She is positive for CVA tenderness on the left mid back to percussion   Lymphadenopathy:    She has no cervical adenopathy.          Assessment & Plan:  She seems to have a pyelonephritis so we will treat with Bactrim DS. She is drinking plenty of water. Culture the urine. Get other labs today as well.

## 2013-08-17 ENCOUNTER — Telehealth: Payer: Self-pay | Admitting: Family Medicine

## 2013-08-17 ENCOUNTER — Other Ambulatory Visit (HOSPITAL_BASED_OUTPATIENT_CLINIC_OR_DEPARTMENT_OTHER): Payer: BC Managed Care – PPO | Admitting: Lab

## 2013-08-17 ENCOUNTER — Ambulatory Visit: Payer: BC Managed Care – PPO | Admitting: Pharmacist

## 2013-08-17 DIAGNOSIS — I639 Cerebral infarction, unspecified: Secondary | ICD-10-CM

## 2013-08-17 DIAGNOSIS — I6350 Cerebral infarction due to unspecified occlusion or stenosis of unspecified cerebral artery: Secondary | ICD-10-CM

## 2013-08-17 DIAGNOSIS — R760 Raised antibody titer: Secondary | ICD-10-CM

## 2013-08-17 NOTE — Telephone Encounter (Signed)
[facility] Triage Call Report Triage Record Num: [id number] Operator: Lodema Pilot Patient Name: Natalie Mullen Call Date & Time: 08/16/2013 10:14:05PM Patient Phone: [phone] PCP: Tera Mater. Clent Ridges Patient Gender: Female PCP Fax : [phone] Patient DOB: [birth year] Practice Name: [facility] Reason for Call: Caller: Roizy/Patient; PCP: G[facility]hon [facility]); CB#: [phone]; Call regarding Urinary Pain/Bleeding and Abdominal Pain. Pt was seen in office on 08/16/13 for UTI. Pt was given Bactrim. Pt was told symptoms should relieve within 24 hours. Pt is complaining of lower abominal pain. Afebrile. Pt has not taken any OTC medicine for the pain. Per Urinary Symptoms - Female Protocol, (+) "All other situations." Advised to follow up with the office. Care advice given. Protocol(s) Used: Urinary Symptoms - Female Recommended Outcome per Protocol: See Provider within 2 Weeks Reason for Outcome: All other situations Care Advice: Drink 8-12 eight ounce (1.6 - 2.4 L) glasses of liquid each day to keep the urine from becoming too concentrated. Concentrated urine irritates the bladder and can lead to urgency. ~ ~ Follow prior advice of provider for similar problems; if symptoms recur or new symptoms develop, call provider. ~ HEALTH PROMOTION / MAINTENANCE ~ SYMPTOM / CONDITION MANAGEMENT ~ CAUTIONS Limit carbonated, alcoholic, and caffeinated beverages such as coffee, tea and soda. Avoid nonprescription cold and allergy medications that contain caffeine. Limit intake of tomatoes, fruit juices (except for unsweetened cranberry juice), dairy products, spicy foods, sugar, and artificial sweeteners (aspartame or saccharine). Stop or decrease smoking. Reducing exposure to bladder irritants may help lessen urgency. ~ See a provider within 24 hours if you are having urinary urgency, frequency, discolored urine, pain or burning with urination; pain/discomfort over  bladder. ~ 09/

## 2013-08-17 NOTE — Progress Notes (Signed)
Xa level=13%. Her INR is likely around 3.5-4. She reports that she is feeling better after starting Bacrtrim last night for presumed pyelonephritis (urine cx in process). She did not call her doctor to ask for a different antibiotic that would not interact with her coumadin. Pt states she did not take her coumadin last night on her own. Pt reports no bruising but states she had another minor nosebleed this morning. Will have Ms. Gerarda Fraction hold Coumadin tonight Then we will decrease her dose significantly over the weekend: take 1 mg on Saturday and Sunday   Recheck Xa level on monday 08/20/13: lab at 8:30am. Spoke with patient over the phone -- not a face-to-face encounter **No Charge

## 2013-08-17 NOTE — Patient Instructions (Signed)
Xa level=13% (INR > 3.5). Hold Coumadin tonight Take 1 mg on Saturday and Sunday  Recheck INR on monday 08/20/13: lab at 8:30am.

## 2013-08-20 ENCOUNTER — Ambulatory Visit: Payer: BC Managed Care – PPO

## 2013-08-20 ENCOUNTER — Other Ambulatory Visit: Payer: BC Managed Care – PPO | Admitting: Lab

## 2013-08-20 ENCOUNTER — Ambulatory Visit: Payer: BC Managed Care – PPO | Admitting: Pharmacist

## 2013-08-20 DIAGNOSIS — I639 Cerebral infarction, unspecified: Secondary | ICD-10-CM

## 2013-08-20 DIAGNOSIS — R760 Raised antibody titer: Secondary | ICD-10-CM

## 2013-08-20 LAB — POCT INR: INR: 0

## 2013-08-20 LAB — CHROMOGENIC FACTOR X
CHROM XA: 13 % (ref 86–146)
CHROM XA: 25.7 % — ABNORMAL LOW (ref 86–146)

## 2013-08-20 NOTE — Progress Notes (Signed)
Xa level = 25.7% (Goal 25-35%).  Ms Natalie Mullen stated she has not had any bleeding.  Today is day 4/10 on Bactrim which is interacting with Coumadin.  Will continue to have Ms Natalie Mullen take Coumadin 1mg  daily while on Bactrim and check PT/INR in 1 week, last day of Bactrim to evaluate increasing Coumadin dose.  Telephone encounter-*NO CHARGE*

## 2013-08-21 ENCOUNTER — Encounter: Payer: Self-pay | Admitting: Family Medicine

## 2013-08-21 ENCOUNTER — Ambulatory Visit (INDEPENDENT_AMBULATORY_CARE_PROVIDER_SITE_OTHER): Payer: BC Managed Care – PPO | Admitting: Family Medicine

## 2013-08-21 VITALS — BP 108/70 | HR 77 | Temp 98.4°F

## 2013-08-21 DIAGNOSIS — K759 Inflammatory liver disease, unspecified: Secondary | ICD-10-CM

## 2013-08-21 NOTE — Progress Notes (Signed)
Quick Note:  I spoke with pt and she is going to come in today for a follow up, per Dr. Clent Ridges request. ______

## 2013-08-22 ENCOUNTER — Encounter: Payer: Self-pay | Admitting: Family Medicine

## 2013-08-22 ENCOUNTER — Telehealth: Payer: Self-pay | Admitting: Family Medicine

## 2013-08-22 LAB — HEPATIC FUNCTION PANEL
ALT: 1532 U/L — ABNORMAL HIGH (ref 0–35)
AST: 1323 U/L — ABNORMAL HIGH (ref 0–37)
Albumin: 3.5 g/dL (ref 3.5–5.2)
Bilirubin, Direct: 1.6 mg/dL — ABNORMAL HIGH (ref 0.0–0.3)
Total Protein: 7.3 g/dL (ref 6.0–8.3)

## 2013-08-22 LAB — HEPATITIS PANEL, ACUTE
Hep B C IgM: NEGATIVE
Hepatitis B Surface Ag: NEGATIVE

## 2013-08-22 NOTE — Telephone Encounter (Signed)
The liver enzymes are about the same as the other day (elevated) but the other tests are still pending

## 2013-08-22 NOTE — Progress Notes (Signed)
  Subjective:    Patient ID: Natalie Mullen, female    DOB: [birth year], 47 y.o.   MRN: [id number]  HPI Here to follow up on apparent hepatitis. She was here on 08-16-13 for ill defined body aches, HA, fatigue, and fever. Her exam was unremarkable. We felt she may have had a UTI so se started on Bactrim DS. Of note she had just returned from a trip to visit family in [place]. We got labs that day which showed very elevated liver enzymes with other labs being normal. We asked her to return today for more testing. Today she feels fine. Her sx resolved after several days and she has no sx today whatsoever. Her appetite is back and she is eating a normal diet. No fevers. She never had jaundice. Her stools have been normal. Her urine had been dark but is back to normal now. She is accompanied today by a close friend who went with her on the trip to [place], and so far he has had no sx.    Review of Systems  Constitutional: Negative.   Respiratory: Negative.   Cardiovascular: Negative.   Gastrointestinal: Negative.   Neurological: Negative.        Objective:   Physical Exam  Constitutional: She appears well-developed and well-nourished. No distress.  She looks at her baseline   Abdominal: Soft. Bowel sounds are normal. She exhibits no distension and no mass. There is no tenderness. There is no rebound and no guarding.  No HSM           Assessment & Plan:  Probable acute hepatitis, most likely type A. We will get labs today to recheck liver enzymes and for an acute hepatitis panel. We will go from there. We discussed how this can be contagious for a week or two, so she will observe strict handwashing, etc.

## 2013-08-22 NOTE — Telephone Encounter (Signed)
Pt is calling to inquire about lab results from 08/21/13. Please assist.

## 2013-08-22 NOTE — Telephone Encounter (Signed)
I spoke with pt  

## 2013-08-22 NOTE — Telephone Encounter (Signed)
Pt calling again to inquire about her lab results.

## 2013-08-23 ENCOUNTER — Ambulatory Visit (INDEPENDENT_AMBULATORY_CARE_PROVIDER_SITE_OTHER): Payer: BC Managed Care – PPO | Admitting: Nurse Practitioner

## 2013-08-23 ENCOUNTER — Encounter: Payer: Self-pay | Admitting: Nurse Practitioner

## 2013-08-23 ENCOUNTER — Other Ambulatory Visit (INDEPENDENT_AMBULATORY_CARE_PROVIDER_SITE_OTHER): Payer: BC Managed Care – PPO

## 2013-08-23 ENCOUNTER — Other Ambulatory Visit: Payer: Self-pay | Admitting: Internal Medicine

## 2013-08-23 VITALS — BP 102/64 | HR 66 | Ht 66.0 in | Wt 137.0 lb

## 2013-08-23 DIAGNOSIS — R5383 Other fatigue: Secondary | ICD-10-CM

## 2013-08-23 DIAGNOSIS — R7989 Other specified abnormal findings of blood chemistry: Secondary | ICD-10-CM

## 2013-08-23 DIAGNOSIS — R5381 Other malaise: Secondary | ICD-10-CM

## 2013-08-23 LAB — HEPATIC FUNCTION PANEL
Alkaline Phosphatase: 131 U/L — ABNORMAL HIGH (ref 39–117)
Bilirubin, Direct: 2.1 mg/dL — ABNORMAL HIGH (ref 0.0–0.3)
Total Bilirubin: 3.8 mg/dL — ABNORMAL HIGH (ref 0.3–1.2)

## 2013-08-23 LAB — CK: Total CK: 23 U/L (ref 7–177)

## 2013-08-23 NOTE — Progress Notes (Signed)
Quick Note:  I spoke with pt and released results in my chart ______ 

## 2013-08-23 NOTE — Patient Instructions (Addendum)
Your physician has requested that you go to the basement for lab work before leaving today.  Please come back Tuesday for additional labs.                                                 We are excited to introduce MyChart, a new best-in-class service that provides you online access to important information in your electronic medical record. We want to make it easier for you to view your health information - all in one secure location - when and where you need it. We expect MyChart will enhance the quality of care and service we provide.  When you register for MyChart, you can:    View your test results.    Request appointments and receive appointment reminders via email.    Request medication renewals.    View your medical history, allergies, medications and immunizations.    Communicate with your physician's office through a password-protected site.    Conveniently print information such as your medication lists.  To find out if MyChart is right for you, please talk to a member of our clinical staff today. We will gladly answer your questions about this free health and wellness tool.  If you are age 49 or older and want a member of your family to have access to your record, you must provide written consent by completing a proxy form available at our office. Please speak to our clinical staff about guidelines regarding accounts for patients younger than age 74.  As you activate your MyChart account and need any technical assistance, please call the MyChart technical support line at [phone]) or email your question to [email or web address]. If you email your question(s), please include your name, a return phone number and the best time to reach you.  If you have non-urgent health-related questions, you can send a message to our office through MyChart at [email or web address]. If you have a medical emergency, call 911.  Thank you for using MyChart as your new  health and wellness resource!   MyChart licensed from [employer],  4540-9811. Patents Pending.

## 2013-08-23 NOTE — Addendum Note (Signed)
Addended by: Gershon Crane A on: 08/23/2013 08:32 AM   Modules accepted: Orders

## 2013-08-24 ENCOUNTER — Telehealth: Payer: Self-pay | Admitting: *Deleted

## 2013-08-24 ENCOUNTER — Other Ambulatory Visit: Payer: Self-pay | Admitting: *Deleted

## 2013-08-24 DIAGNOSIS — R945 Abnormal results of liver function studies: Secondary | ICD-10-CM

## 2013-08-24 DIAGNOSIS — R7989 Other specified abnormal findings of blood chemistry: Secondary | ICD-10-CM | POA: Insufficient documentation

## 2013-08-24 LAB — CMV DNA, QUANTITATIVE, PCR: Cytomegalovirus, DNA Quant PCR: <363 copies/mL (ref <363)

## 2013-08-24 NOTE — Progress Notes (Signed)
HPI :  Patient is a 47 year female from [place], residing in the [place] for 13 or so years. A couple of weeks ago patient went back to visit [place] and while she was there began to experience fatigue and lower back pain. [place] went to see PCP (last Thursday). Patient felt to have possible pylelonephritis, was started on Bactrim and labs drawn. Patient was called back to office this past Tuesday as LFTs had returned markedly abnormal. The Bactrim was discontinued based on negative urine culture. Viral hepatitis A, B and C serologies were negative. Patient referred here for further evaluation.   Patient did not have abdominal pain, nausea or any bowel changes with the fatigue and back pain. She has had chills but no fever. No one else on trip got sick. She takes 3 prescription meds, nothing else. No OTC meds. No vitamins, herbs. No FMH of liver disease. No illicit drug use.    Past Medical History  Diagnosis Date  . 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 Anticardiolipin antibody positive     sees Dr. Cyndie Chime  . Hypothyroid   . Dysphagia as late effect of stroke 03/27/2012    Possible component of reflux esophagitis  . Antiphospholipid antibody with hypercoagulable state 03/27/2012    Initial presentation 05/2010 with acute left MCA stroke    Family History  Problem Relation Age of Onset  . Arthritis    . Stroke    . Heart disease Father    History  Substance Use Topics  . Smoking status: Former Smoker    Types: Cigarettes    Quit date: 06/17/2010  . Smokeless tobacco: Never Used  . Alcohol Use: Yes     Comment: socially   Current Outpatient Prescriptions  Medication Sig Dispense Refill  . aspirin 81 MG tablet Take 81 mg by mouth daily.        Marland Kitchen levothyroxine (SYNTHROID, LEVOTHROID) 75 MCG tablet Take 1 tablet (75 mcg total) by mouth daily.  90 tablet  3  .  sulfamethoxazole-trimethoprim (BACTRIM DS) 800-160 MG per tablet Take 1 tablet by mouth 2 (two) times daily.  20 tablet  0  . warfarin (COUMADIN) 1 MG tablet 1 mg daily. While taking Bactrim      . warfarin (COUMADIN) 4 MG tablet 4 mg daily.       No current facility-administered medications for this visit.      08/16/2013 12:29 08/21/2013 17:07  Glucose 52 (L)   Alkaline Phosphatase 115 118 (H)  Albumin 3.6 3.5  AST 1310 (H) 1323 (H)  ALT 1640 (H) 1532 (H)  Total Protein 7.8 7.3  Bilirubin, Direct 1.1 (H) 1.6 (H)  Total Bilirubin 2.6 (H) 2.9 (H)   Review of Systems: All systems reviewed and negative except where noted in HPI.   Physical Exam: BP 102/64  Pulse 66  Ht 5\' 6"  (1.676 m)  Wt 137 lb (62.143 kg)  BMI 22.12 kg/m2  LMP 07/21/2013 Constitutional: Pleasant,well-developed, female in no acute distress. HEENT: Normocephalic and atraumatic. Conjunctivae are normal. Maybe mild icterus. Neck supple.  Cardiovascular: Normal rate, regular rhythm.  Pulmonary/chest: Effort normal and breath sounds normal. No wheezing, rales or rhonchi. Abdominal: Soft, nondistended, nontender. Bowel sounds active throughout. There are no masses palpable. No hepatomegaly. Extremities: no edema Lymphadenopathy: No cervical adenopathy noted. Neurological: Alert and oriented to person place and time. Skin: Skin is warm and dry.  No rashes noted. Psychiatric: Normal mood and affect. Behavior is normal.   ASSESSMENT AND PLAN:  93. 47 year old female with acute hepatitis of unclear etiology.  LFTs slightly better at last check 08/21/13. Patient feels fine. Hep A, B,C serologies negative. Suspect viral hepatitis though autoimmune not excluded. With history of antiphospholipid antibody would worry about hepatic venous occlusion but seem unlikely in absence of abdominal pain. Will check additional viral studies. Though no abdominal pain her bilirubin is slightly elevated so will check an abdominal ultrasound.  Patient will come for repeat LFTs Tuesday (in 5 days). She knows to call in the interim for recurrent fatigue, any fevers or development of any other symptoms especially abdominal pain.   2. History of antiphospholipid antibody the hypercoagulable state. On chronic coumadin.

## 2013-08-24 NOTE — Telephone Encounter (Signed)
Left a message for patient to call me. 

## 2013-08-24 NOTE — Telephone Encounter (Signed)
Per Willette Cluster, NP, please, call patient and let her know liver test a little worse. Added some more lab work for autoimmune disease. Will call with results when available.

## 2013-08-24 NOTE — Progress Notes (Signed)
So far etiology of transaminitis/hepatitis unclear. We are getting [place] - CK was normal, will also do autoimmune serologies.  Agree with Ms. Guenther's assessment and plan. Iva Boop, MD, [facility]

## 2013-08-24 NOTE — Telephone Encounter (Signed)
Spoke with patient and gave her informtion. She is asking if a test for [place] could be done. (a virus in the region of [place] where she has been traveling)

## 2013-08-27 ENCOUNTER — Other Ambulatory Visit (HOSPITAL_BASED_OUTPATIENT_CLINIC_OR_DEPARTMENT_OTHER): Payer: BC Managed Care – PPO

## 2013-08-27 ENCOUNTER — Ambulatory Visit (HOSPITAL_BASED_OUTPATIENT_CLINIC_OR_DEPARTMENT_OTHER): Payer: BC Managed Care – PPO | Admitting: Pharmacist

## 2013-08-27 ENCOUNTER — Ambulatory Visit (INDEPENDENT_AMBULATORY_CARE_PROVIDER_SITE_OTHER): Payer: BC Managed Care – PPO | Admitting: Internal Medicine

## 2013-08-27 ENCOUNTER — Other Ambulatory Visit: Payer: Self-pay | Admitting: Gastroenterology

## 2013-08-27 ENCOUNTER — Other Ambulatory Visit: Payer: BC Managed Care – PPO

## 2013-08-27 ENCOUNTER — Encounter: Payer: Self-pay | Admitting: Internal Medicine

## 2013-08-27 ENCOUNTER — Telehealth: Payer: Self-pay | Admitting: *Deleted

## 2013-08-27 ENCOUNTER — Other Ambulatory Visit (INDEPENDENT_AMBULATORY_CARE_PROVIDER_SITE_OTHER): Payer: BC Managed Care – PPO

## 2013-08-27 VITALS — BP 100/64 | HR 60 | Ht 66.0 in | Wt 139.4 lb

## 2013-08-27 DIAGNOSIS — R945 Abnormal results of liver function studies: Secondary | ICD-10-CM

## 2013-08-27 DIAGNOSIS — R894 Abnormal immunological findings in specimens from other organs, systems and tissues: Secondary | ICD-10-CM

## 2013-08-27 DIAGNOSIS — I6350 Cerebral infarction due to unspecified occlusion or stenosis of unspecified cerebral artery: Secondary | ICD-10-CM

## 2013-08-27 DIAGNOSIS — R760 Raised antibody titer: Secondary | ICD-10-CM

## 2013-08-27 DIAGNOSIS — K754 Autoimmune hepatitis: Secondary | ICD-10-CM | POA: Insufficient documentation

## 2013-08-27 DIAGNOSIS — I639 Cerebral infarction, unspecified: Secondary | ICD-10-CM

## 2013-08-27 DIAGNOSIS — R7989 Other specified abnormal findings of blood chemistry: Secondary | ICD-10-CM

## 2013-08-27 LAB — ANTI-NUCLEAR AB-TITER (ANA TITER)

## 2013-08-27 LAB — HEPATIC FUNCTION PANEL
AST: 1800 U/L — ABNORMAL HIGH (ref 0–37)
Albumin: 3.1 g/dL — ABNORMAL LOW (ref 3.5–5.2)
Bilirubin, Direct: 2.3 mg/dL — ABNORMAL HIGH (ref 0.0–0.3)

## 2013-08-27 LAB — ANA: Anti Nuclear Antibody(ANA): POSITIVE — AB

## 2013-08-27 LAB — IGG, IGA, IGM: IgM, Serum: 182 mg/dL (ref 52–322)

## 2013-08-27 MED ORDER — ENOXAPARIN SODIUM 100 MG/ML ~~LOC~~ SOLN: 100.0000 mg | Freq: Every day | SUBCUTANEOUS | Status: DC

## 2013-08-27 MED ORDER — PREDNISONE 10 MG PO TABS: 40.0000 mg | ORAL_TABLET | Freq: Every day | ORAL | Status: DC

## 2013-08-27 NOTE — Patient Instructions (Addendum)
Please start the prednisone medication as we discussed.  I also want you to take vitamin D 100 IU daily and Calcium 10-1499 mg daily (5-6 Tums Extra Strength a day) is an option  You will need to have additional blood drawn.  I will discuss the anticoagulation situation with Dr. Cyndie Chime and then contact you about the liver biopsy.  I appreciate the opportunity to care for you. Iva Boop, MD, [facility]
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
## 2013-08-27 NOTE — Telephone Encounter (Signed)
Patient came by office and she will come back at 3:30 PM to see Dr. Leone Payor per [place].

## 2013-08-27 NOTE — Assessment & Plan Note (Addendum)
I think the current information we have of a positive ANA and the severe transaminitis and what the other tests exclude indicate this is the most likely diagnosis. I've explained this to the patient and a friend who came with her today. Her situation is complicated by her need for anticoagulation therapy. 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 I will communicate with Dr. Cyndie Chime about this and coordinate that. I've explained short-term and long-term risks of prednisone and the need for chronic immunosuppressive therapy in autoimmune hepatitis. I have provided her with information about autoimmune hepatitis as well. She will need calcium and vitamin D supplementation this is recommended. She will need other studies to assess immune status to hepatitis B and A and possible vaccinations. Pneumococcal vaccine would be appropriate as well. This is not so important right now but will be, as well as influenza vaccination also.

## 2013-08-27 NOTE — Progress Notes (Signed)
Subjective:    Patient ID: Natalie Mullen, female    DOB: 1966-08-04, 47 y.o.   MRN: [id number]  HPI  Patient is a 49 year woman originally  from [place], residing in the [place] for 13 or so years. She was seen last week by Willette Cluster, NP-C. HPI as follows.  " A couple of weeks ago patient went back to visit [place] and while she was there began to experience fatigue and lower back pain. [place] went to see PCP (last Thursday). Patient felt to have possible pylelonephritis, was started on Bactrim and labs drawn. Patient was called back to office this past Tuesday as LFTs had returned markedly abnormal. The Bactrim was discontinued based on negative urine culture. Viral hepatitis A, B and C serologies were negative. Patient referred here for further evaluation.  Patient did not have abdominal pain, nausea or any bowel changes with the fatigue and back pain. She has had chills but no fever. No one else on trip got sick. She takes 3 prescription meds, nothing else. No OTC meds. No vitamins, herbs. No FMH of liver disease. No illicit drug use. "  She feels ok now but has persistent severe transaminitis and now is having darker urine and mild scleral icterus. Here with significant other today.  She has continued to be active and play tennis.  No Known Allergies Outpatient Prescriptions Prior to Visit  Medication Sig Dispense Refill  . aspirin 81 MG tablet Take 81 mg by mouth daily.        Marland Kitchen levothyroxine (SYNTHROID, LEVOTHROID) 75 MCG tablet Take 1 tablet (75 mcg total) by mouth daily.  90 tablet  3  . warfarin (COUMADIN) 1 MG tablet 1 mg daily. While taking Bactrim      . warfarin (COUMADIN) 4 MG tablet 4 mg daily.       No facility-administered medications prior to visit.   Past Medical History  Diagnosis Date  . 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 Anticardiolipin antibody positive      sees Dr. Cyndie Chime  . Hypothyroid   . Dysphagia as late effect of stroke 03/27/2012    Possible component of reflux esophagitis  . Antiphospholipid antibody with hypercoagulable state 03/27/2012    Initial presentation 05/2010 with acute left MCA stroke   Past Surgical History  Procedure Laterality Date  . Tonsillectomy    . Lasik    . Appendectomy    . Cesarean section      3   History   Social History  . Marital Status: Married    Spouse Name: N/A    Number of Children: N/A  . Years of Education: N/A   Social History Main Topics  . Smoking status: Former Smoker    Types: Cigarettes    Quit date: 06/17/2010  . Smokeless tobacco: Never Used  . Alcohol Use: Yes     Comment: socially  . Drug Use: No  . Sexual Activity: Yes    Partners: Male    Birth Control/ Protection: Condom   Other Topics Concern  . None   Social History Narrative   Married   Originally from [place]   Avid tennis player   Family History  Problem Relation Age of Onset  . Arthritis    . Stroke    . Heart disease Father    Review of Systems As above    Objective:  Physical Exam General:  NAD Eyes:   Slightly icteric  Lungs:  clear Heart:  S1S2 no rubs, murmurs or gallops Abdomen:  soft and nontender, BS+ Ext:   no edema    Data Reviewed:  Lab Results  Component Value Date   ALT 1796* 08/27/2013   AST 1800* 08/27/2013   ALKPHOS 106 08/27/2013   BILITOT 4.1* 08/27/2013   Other studies in the EMR show a positive ANA with a homogeneous 1-160 ratio. The anti-smooth muscle antibody is pending. Her viral studies for hepatitis a be and see are negative and her Epstein-Barr virus and cytomegalovirus studies are negative.  Chromogenic  Factor X performed today to follow her warfarin therapy was 25.7 which is reported is low. This is followed by Dr. Cyndie Chime.    Assessment & Plan:   1. Autoimmune hepatitis

## 2013-08-27 NOTE — Telephone Encounter (Signed)
Per Willette Cluster, NP and Dr. Leone Payor, patient needs to be seen. Today at 3:30 PM if possible. Left a message for patient to call me.

## 2013-08-28 ENCOUNTER — Ambulatory Visit (HOSPITAL_COMMUNITY)
Admission: RE | Admit: 2013-08-28 | Discharge: 2013-08-28 | Disposition: A | Discharge status: Home / Self Care | Payer: BC Managed Care – PPO | Source: Ambulatory Visit | Attending: Internal Medicine | Admitting: Internal Medicine

## 2013-08-28 ENCOUNTER — Other Ambulatory Visit: Payer: Self-pay

## 2013-08-28 ENCOUNTER — Telehealth: Payer: Self-pay | Admitting: Pharmacist

## 2013-08-28 DIAGNOSIS — R7989 Other specified abnormal findings of blood chemistry: Secondary | ICD-10-CM | POA: Insufficient documentation

## 2013-08-28 DIAGNOSIS — R5383 Other fatigue: Secondary | ICD-10-CM

## 2013-08-28 LAB — ANTI-SMOOTH MUSCLE ANTIBODY, IGG: Smooth Muscle Ab: 105 U — ABNORMAL HIGH (ref <20)

## 2013-08-28 NOTE — Telephone Encounter (Signed)
I s/w Natalie Mullen over the phone today.  She will be having liver biopsy on 09/03/13.  She has questionable autoimmune hepatitis.  She is now on high dose Prednisone. She did start her Lovenox injections last night. I instructed her to hold Lovenox on Sunday & Monday then restart on Tuesday. We will follow along for plan from Dr. Cyndie Chime to switch back to Coumadin when appropriate.  At that time we'll need to call pt & have her restart Coumadin. Ebony Hail, Pharm.D., CPP 08/28/2013@4 :18 PM

## 2013-08-28 NOTE — Progress Notes (Signed)
Quick Note:  Liver looks ok - good news This is expected She will need a liver bx set up  Dr. Cyndie Chime plans to change her to Lovenox soon because of hepatitis and she will need to hold that day of liver bx and restart day after  Let's try to get a liver bx re: abnormal transaminases and suspected autoimmune hepatitis for next week  I do not know if she has heard from Dr. Cyndie Chime yet but he and I have communicated about this  Keep me posted ______

## 2013-08-29 ENCOUNTER — Telehealth: Payer: Self-pay | Admitting: Pharmacist

## 2013-08-29 ENCOUNTER — Other Ambulatory Visit: Payer: Self-pay | Admitting: Radiology

## 2013-08-29 LAB — CHROMOGENIC FACTOR X: CHROM XA: 46 % — ABNORMAL LOW (ref 86–146)

## 2013-08-29 NOTE — Telephone Encounter (Signed)
I s/w Tiffany in Radiology Scheduling re: pt holding Lovenox. Tiffany is aware of my note left in pts chart that pt will hold Lovenox Sunday & Monday then restart Tues. Ebony Hail, Pharm.D., CPP 08/29/2013@2 :14 PM

## 2013-08-30 ENCOUNTER — Encounter: Payer: Self-pay | Admitting: Internal Medicine

## 2013-08-31 ENCOUNTER — Encounter: Payer: Self-pay | Admitting: Internal Medicine

## 2013-08-31 ENCOUNTER — Encounter (HOSPITAL_COMMUNITY): Payer: Self-pay | Admitting: Pharmacy Technician

## 2013-08-31 ENCOUNTER — Other Ambulatory Visit: Payer: Self-pay | Admitting: Radiology

## 2013-09-01 ENCOUNTER — Encounter: Payer: Self-pay | Admitting: Internal Medicine

## 2013-09-03 ENCOUNTER — Ambulatory Visit (HOSPITAL_COMMUNITY)
Admission: RE | Admit: 2013-09-03 | Discharge: 2013-09-03 | Disposition: A | Discharge status: Home / Self Care | Payer: BC Managed Care – PPO | Source: Ambulatory Visit | Attending: Internal Medicine | Admitting: Internal Medicine

## 2013-09-03 ENCOUNTER — Encounter (HOSPITAL_COMMUNITY): Payer: Self-pay

## 2013-09-03 ENCOUNTER — Encounter: Payer: Self-pay | Admitting: Family Medicine

## 2013-09-03 ENCOUNTER — Other Ambulatory Visit: Payer: Self-pay

## 2013-09-03 ENCOUNTER — Encounter: Payer: Self-pay | Admitting: Oncology

## 2013-09-03 DIAGNOSIS — Z7901 Long term (current) use of anticoagulants: Secondary | ICD-10-CM | POA: Insufficient documentation

## 2013-09-03 DIAGNOSIS — I69991 Dysphagia following unspecified cerebrovascular disease: Secondary | ICD-10-CM | POA: Insufficient documentation

## 2013-09-03 DIAGNOSIS — E039 Hypothyroidism, unspecified: Secondary | ICD-10-CM | POA: Insufficient documentation

## 2013-09-03 DIAGNOSIS — K838 Other specified diseases of biliary tract: Secondary | ICD-10-CM | POA: Insufficient documentation

## 2013-09-03 DIAGNOSIS — D6859 Other primary thrombophilia: Secondary | ICD-10-CM | POA: Insufficient documentation

## 2013-09-03 DIAGNOSIS — I498 Other specified cardiac arrhythmias: Secondary | ICD-10-CM | POA: Insufficient documentation

## 2013-09-03 DIAGNOSIS — R1310 Dysphagia, unspecified: Secondary | ICD-10-CM | POA: Insufficient documentation

## 2013-09-03 DIAGNOSIS — R7989 Other specified abnormal findings of blood chemistry: Secondary | ICD-10-CM

## 2013-09-03 DIAGNOSIS — K739 Chronic hepatitis, unspecified: Secondary | ICD-10-CM | POA: Insufficient documentation

## 2013-09-03 LAB — CBC
HCT: 38.2 % (ref 36.0–46.0)
Hemoglobin: 12.7 g/dL (ref 12.0–15.0)
MCH: 28.9 pg (ref 26.0–34.0)
MCHC: 33.2 g/dL (ref 30.0–36.0)
MCV: 87 fL (ref 78.0–100.0)

## 2013-09-03 MED ORDER — SODIUM CHLORIDE 0.9 % IV SOLN
Freq: Once | INTRAVENOUS | Status: AC
  Administered 2013-09-03: 09:00:00 via INTRAVENOUS

## 2013-09-03 MED ORDER — MIDAZOLAM HCL 2 MG/2ML IJ SOLN
INTRAMUSCULAR | Status: AC
  Filled 2013-09-03: qty 4

## 2013-09-03 MED ORDER — FENTANYL CITRATE 0.05 MG/ML IJ SOLN
INTRAMUSCULAR | Status: AC
  Filled 2013-09-03: qty 4

## 2013-09-03 MED ORDER — ATROPINE SULFATE 0.1 MG/ML IJ SOLN
INTRAMUSCULAR | Status: AC
  Filled 2013-09-03: qty 10

## 2013-09-03 MED ORDER — FENTANYL CITRATE 0.05 MG/ML IJ SOLN
INTRAMUSCULAR | Status: AC | PRN
  Administered 2013-09-03: 100 ug via INTRAVENOUS

## 2013-09-03 MED ORDER — MIDAZOLAM HCL 2 MG/2ML IJ SOLN
INTRAMUSCULAR | Status: AC | PRN
  Administered 2013-09-03: 1 mg via INTRAVENOUS
  Administered 2013-09-03: 2 mg via INTRAVENOUS

## 2013-09-03 MED ORDER — HYDROCODONE-ACETAMINOPHEN 5-325 MG PO TABS
1.0000 | ORAL_TABLET | ORAL | Status: DC | PRN
  Filled 2013-09-03: qty 2

## 2013-09-03 NOTE — Procedures (Signed)
Interventional Radiology Procedure Note  Procedure: Successful random liver biopsy.  3x 18G cores obatined coaxially through a single 17G capsular puncture.  Biopsy tract embolized with gelfoam. Complications: none Recommendations: - Bedrest x 3 hrs - Path pending  Signed,  Sterling Big, MD Vascular & Interventional Radiologist [facility] Radiology

## 2013-09-03 NOTE — H&P (Signed)
Natalie Mullen is an 47 y.o. female.   Chief Complaint: "I'm having a liver biopsy" HPI: Patient with history of elevated LFT's presents today for US guided random liver biopsy to r/o autoimmune hepatitis.  Past Medical History  Diagnosis Date  . 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 Anticardiolipin antibody positive     sees Dr. Cyndie Chime  . Hypothyroid   . Dysphagia as late effect of stroke 03/27/2012    Possible component of reflux esophagitis  . Antiphospholipid antibody with hypercoagulable state 03/27/2012    Initial presentation 05/2010 with acute left MCA stroke    Past Surgical History  Procedure Laterality Date  . Tonsillectomy    . Lasik    . Appendectomy    . Cesarean section      3    Family History  Problem Relation Age of Onset  . Arthritis    . Stroke    . Heart disease Father    Social History:  reports that she quit smoking about 3 years ago. Her smoking use included Cigarettes. She smoked 0.00 packs per day. She has never used smokeless tobacco. She reports that she drinks alcohol. She reports that she does not use illicit drugs.  Allergies: No Known Allergies  Current outpatient prescriptions:aspirin 81 MG tablet, Take 81 mg by mouth every evening. , Disp: , Rfl: ;  calcium carbonate (TUMS - DOSED IN MG ELEMENTAL CALCIUM) 500 MG chewable tablet, Chew 3 tablets by mouth 2 (two) times daily., Disp: , Rfl: ;  cholecalciferol (VITAMIN D) 1000 UNITS tablet, Take 1,000 Units by mouth daily., Disp: , Rfl: ;  enoxaparin (LOVENOX) 100 MG/ML injection, Inject 100 mg into the skin every evening., Disp: , Rfl:  levothyroxine (SYNTHROID, LEVOTHROID) 75 MCG tablet, Take 75 mcg by mouth at bedtime., Disp: , Rfl: ;  predniSONE (DELTASONE) 10 MG tablet, Take 40 mg by mouth daily., Disp: , Rfl: ;  warfarin (COUMADIN) 1 MG tablet, Take 1 mg by mouth daily. Pt was on bactrim and they put her on a lower  dose.  She was taken 4 mg before that., Disp: , Rfl:  Current facility-administered medications:0.9 %  sodium chloride infusion, , Intravenous, Once, Berneta Levins, PA-C   Results for orders placed in visit on 08/27/13  IGG, IGA, IGM      Result Value Range   IgG (Immunoglobin G), Serum 1720 (*) 690 - 1700 mg/dL   IgA 161  69 - 096 mg/dL   IgM, Serum 045  52 - 322 mg/dL   40/98/11 labs pending Review of Systems  Constitutional: Negative for fever and chills.  Respiratory: Negative for cough and shortness of breath.   Cardiovascular: Negative for chest pain.  Gastrointestinal: Negative for nausea, vomiting and abdominal pain.  Musculoskeletal: Negative for back pain.  Neurological: Negative for headaches.  Endo/Heme/Allergies: Does not bruise/bleed easily.    Blood pressure 107/56, pulse 45, temperature 97 F (36.1 C), temperature source Oral, resp. rate 18, height 5\' 6"  (1.676 m), weight 136 lb (61.689 kg), last menstrual period 07/21/2013, SpO2 99.00%. Physical Exam  Constitutional: She is oriented to person, place, and time. She appears well-developed and well-nourished.  Cardiovascular:  Bradycardic but regular rhythm  Respiratory: Effort normal and breath sounds normal.  GI: Soft. Bowel sounds are normal. There is no tenderness.  Musculoskeletal: Normal range of motion. She exhibits no edema.  Neurological: She is alert and oriented to  person, place, and time.     Assessment/Plan Pt with history of elevated LFT's. Plan is for US guided random core liver biopsy to r/o autoimmune hepatitis. Details/risks of procedure d/w pt with her understanding and consent.  ALLRED,D KEVIN 09/03/2013, 9:02 AM

## 2013-09-04 ENCOUNTER — Telehealth: Payer: Self-pay | Admitting: *Deleted

## 2013-09-04 NOTE — Telephone Encounter (Signed)
Pls advise.  

## 2013-09-04 NOTE — Telephone Encounter (Signed)
Good afternoon Dr. Cyndie Chime, I am wondering if I can start back WARFARIN tonight since the liver biopsy has been done. Thank you very much, Natalie Mullen  Received this message via MyChart from patient.  Reviewed records and she is to resume Lovenox today per Laure Kidney Pharm D.  Patient LFT's elevated.  Eulogia notified of these orders. Denies need for Lovenox refill and will resume injections while she awaits the biopsy results.

## 2013-09-06 ENCOUNTER — Other Ambulatory Visit (INDEPENDENT_AMBULATORY_CARE_PROVIDER_SITE_OTHER): Payer: BC Managed Care – PPO

## 2013-09-06 ENCOUNTER — Encounter: Payer: Self-pay | Admitting: Pharmacist

## 2013-09-06 ENCOUNTER — Encounter: Payer: Self-pay | Admitting: Internal Medicine

## 2013-09-06 ENCOUNTER — Other Ambulatory Visit: Payer: Self-pay

## 2013-09-06 DIAGNOSIS — K754 Autoimmune hepatitis: Secondary | ICD-10-CM

## 2013-09-06 DIAGNOSIS — D6861 Antiphospholipid syndrome: Secondary | ICD-10-CM

## 2013-09-06 LAB — HEPATIC FUNCTION PANEL
AST: 70 U/L — ABNORMAL HIGH (ref 0–37)
Alkaline Phosphatase: 79 U/L (ref 39–117)
Bilirubin, Direct: 0.5 mg/dL — ABNORMAL HIGH (ref 0.0–0.3)
Total Bilirubin: 1.4 mg/dL — ABNORMAL HIGH (ref 0.3–1.2)
Total Protein: 7.6 g/dL (ref 6.0–8.3)

## 2013-09-06 MED ORDER — ENOXAPARIN SODIUM 100 MG/ML ~~LOC~~ SOLN: 100.0000 mg | Freq: Every evening | SUBCUTANEOUS | Status: DC

## 2013-09-06 NOTE — Progress Notes (Signed)
Quick Note:  Rev in early Nov ______

## 2013-09-06 NOTE — Progress Notes (Signed)
Quick Note:  Great response to prednisone Will recheck in LFT's in 2 weeks Continue current dose of prednisone for now I will let her know by My Chart ______

## 2013-09-06 NOTE — Progress Notes (Signed)
Quick Note:  Let her know that the biopsy confirms autoimmune hepatitis I will call her and/or message her soon to explain more   She needs LFT's this week please Also needs TPMT enzyme activity test in anticipation of starting azathioprine ______

## 2013-09-06 NOTE — Progress Notes (Unsigned)
I s/w pt over phone today in f/u from liver bx (autoimmune hepatitis). Dr. Cyndie Chime plans to keep pt on Lovenox until she has seen Dr. Leone Payor & had repeat LFT's.  When the LFT's return to normal limits, he would like pt to start on Xarelto 20 mg daily (no loading dose).  Natalie Mullen is in agreement w/ this plan. When Natalie Mullen comes back to [facility], we need to repeat CMET & get baseline CBC before starting Xarelto. For now, stay on Lovenox. Ebony Hail, Pharm.D., CPP 09/06/2013@11 :29 AM

## 2013-09-07 ENCOUNTER — Encounter: Payer: Self-pay | Admitting: Pharmacist

## 2013-09-07 ENCOUNTER — Other Ambulatory Visit: Payer: Self-pay | Admitting: *Deleted

## 2013-09-07 DIAGNOSIS — K754 Autoimmune hepatitis: Secondary | ICD-10-CM

## 2013-09-07 DIAGNOSIS — D6861 Antiphospholipid syndrome: Secondary | ICD-10-CM

## 2013-09-07 MED ORDER — RIVAROXABAN 20 MG PO TABS: 20.0000 mg | ORAL_TABLET | Freq: Every day | ORAL | Status: DC

## 2013-09-07 NOTE — Progress Notes (Unsigned)
LFT's from yesterday at Dr. Marvell Fuller office reviewed by Dr. Cyndie Chime. He would like pt to complete Lovenox on Sunday evening. Start Xarelto 20 mg daily (no loading dose) on Mon (09/10/13). We'll have pt come here on 09/20/13 for CBC & LFT's only.  We can see her in Coumadin clinic to assess tolerance of Xarelto & if ok, then we can discharge from our Coumadin clinic. I spoke w/ pt over phone & she understands plan.   If at any point she should have to be on azathioprine, it does not interact w/ her Xarelto. Ebony Hail, Pharm.D., CPP 09/07/2013@2 :32 PM

## 2013-09-10 ENCOUNTER — Encounter: Payer: Self-pay | Admitting: Internal Medicine

## 2013-09-13 ENCOUNTER — Telehealth: Payer: Self-pay | Admitting: Internal Medicine

## 2013-09-13 ENCOUNTER — Encounter: Payer: Self-pay | Admitting: Internal Medicine

## 2013-09-13 DIAGNOSIS — K7689 Other specified diseases of liver: Secondary | ICD-10-CM

## 2013-09-13 LAB — THIOPURINE METHYLTRANSFERASE (TPMT), RBC

## 2013-09-13 NOTE — Telephone Encounter (Addendum)
I spoke to her about repeating labs, etc  She should come in about 1 week to do LFT's again  She is not able to make 11/7 appt w/ me so please add her on to 11/5 schedule  I could possibly see her at 815 if we could start then or work her in to another slot  She should  do LFT's on 10/30  I explained autoimmune nature of disease and that it was liver not gallbladder problem  She has lost a few # - will f/u at next visit

## 2013-09-14 ENCOUNTER — Encounter: Payer: Self-pay | Admitting: Pharmacist

## 2013-09-14 NOTE — Telephone Encounter (Signed)
Patient will come for labs 09/19/13 and office visit 09/20/13 3:15.  Dr. Leone Payor aware of the change

## 2013-09-14 NOTE — Progress Notes (Unsigned)
Due to computer routing problems, documentation note from 09/06/13 & 09/07/13 from Ebony Hail, [place].D. Were not closed & not routed to MD. MD Cyndie Chime) Please review her notes from those dates.   This entry was recommended from IT to retry to see if the problem is recurring.

## 2013-09-16 ENCOUNTER — Encounter: Payer: Self-pay | Admitting: Internal Medicine

## 2013-09-17 ENCOUNTER — Encounter: Payer: Self-pay | Admitting: Internal Medicine

## 2013-09-19 ENCOUNTER — Other Ambulatory Visit (INDEPENDENT_AMBULATORY_CARE_PROVIDER_SITE_OTHER): Payer: BC Managed Care – PPO

## 2013-09-19 DIAGNOSIS — K7689 Other specified diseases of liver: Secondary | ICD-10-CM

## 2013-09-19 LAB — HEPATIC FUNCTION PANEL
ALT: 51 U/L — ABNORMAL HIGH (ref 0–35)
AST: 26 U/L (ref 0–37)
Albumin: 3.6 g/dL (ref 3.5–5.2)
Alkaline Phosphatase: 63 U/L (ref 39–117)
Bilirubin, Direct: 0.2 mg/dL (ref 0.0–0.3)

## 2013-09-20 ENCOUNTER — Other Ambulatory Visit: Payer: BC Managed Care – PPO

## 2013-09-20 ENCOUNTER — Ambulatory Visit: Payer: BC Managed Care – PPO | Admitting: Pharmacist

## 2013-09-20 ENCOUNTER — Ambulatory Visit (INDEPENDENT_AMBULATORY_CARE_PROVIDER_SITE_OTHER): Payer: BC Managed Care – PPO | Admitting: Internal Medicine

## 2013-09-20 ENCOUNTER — Other Ambulatory Visit (HOSPITAL_BASED_OUTPATIENT_CLINIC_OR_DEPARTMENT_OTHER): Payer: BC Managed Care – PPO | Admitting: Lab

## 2013-09-20 ENCOUNTER — Other Ambulatory Visit: Payer: BC Managed Care – PPO | Admitting: Lab

## 2013-09-20 ENCOUNTER — Encounter: Payer: Self-pay | Admitting: Internal Medicine

## 2013-09-20 ENCOUNTER — Ambulatory Visit: Payer: BC Managed Care – PPO

## 2013-09-20 VITALS — BP 110/70 | HR 68 | Ht 66.0 in | Wt 141.0 lb

## 2013-09-20 DIAGNOSIS — D6861 Antiphospholipid syndrome: Secondary | ICD-10-CM

## 2013-09-20 DIAGNOSIS — K754 Autoimmune hepatitis: Secondary | ICD-10-CM

## 2013-09-20 DIAGNOSIS — Z79899 Other long term (current) drug therapy: Secondary | ICD-10-CM

## 2013-09-20 DIAGNOSIS — D6859 Other primary thrombophilia: Secondary | ICD-10-CM

## 2013-09-20 LAB — CBC WITH DIFFERENTIAL/PLATELET
BASO%: 0.7 % (ref 0.0–2.0)
Basophils Absolute: 0.1 10*3/uL (ref 0.0–0.1)
EOS%: 0.6 % (ref 0.0–7.0)
Eosinophils Absolute: 0.1 10*3/uL (ref 0.0–0.5)
HGB: 13.9 g/dL (ref 11.6–15.9)
MCHC: 33.3 g/dL (ref 31.5–36.0)
MCV: 87.4 fL (ref 79.5–101.0)
MONO%: 5.7 % (ref 0.0–14.0)
NEUT#: 9.6 10*3/uL — ABNORMAL HIGH (ref 1.5–6.5)
RBC: 4.77 10*6/uL (ref 3.70–5.45)
RDW: 16.2 % — ABNORMAL HIGH (ref 11.2–14.5)
lymph#: 4.7 10*3/uL — ABNORMAL HIGH (ref 0.9–3.3)

## 2013-09-20 LAB — COMPREHENSIVE METABOLIC PANEL (CC13)
ALT: 47 U/L (ref 0–55)
Alkaline Phosphatase: 62 U/L (ref 40–150)
Anion Gap: 10 mEq/L (ref 3–11)
BUN: 17.2 mg/dL (ref 7.0–26.0)
CO2: 23 mEq/L (ref 22–29)
Chloride: 104 mEq/L (ref 98–109)
Creatinine: 0.9 mg/dL (ref 0.6–1.1)
Glucose: 88 mg/dl (ref 70–140)
Sodium: 137 mEq/L (ref 136–145)
Total Bilirubin: 1 mg/dL (ref 0.20–1.20)
Total Protein: 7.2 g/dL (ref 6.4–8.3)

## 2013-09-20 LAB — POCT INR: INR: 0

## 2013-09-20 MED ORDER — AZATHIOPRINE 50 MG PO TABS: 25.0000 mg | ORAL_TABLET | Freq: Every day | ORAL | Status: DC

## 2013-09-20 NOTE — Progress Notes (Signed)
Pt returned today for CBC and LFT labs and for assessment of Xarelto Pt is doing well with no complaints She reports some minor bruising from needle sticks when she has labs drawn but no bleeding She reports no issues with her Xarelto and she has been taking the Xarelto as instructed Her labs look good today. Her LFTs have returned to normal. Her Hgb is 13.9 and platelets are 307 Ms. Natalie Mullen is ready to be discharged from our coumadin clinic She will return for Lab and office visit with Dr. Cyndie Chime on 12/15 Telephone Encounter - No charge

## 2013-09-20 NOTE — Assessment & Plan Note (Signed)
She will begin azathioprine. I recommended prophylactic vaccines with shingles, once prednisone dose below 20 mg. She should have a pneumonia vaccine for these reasons and her liver problems, would also consider hepatitis A and B. vaccines for this but also mainly because of her liver disease. She should also have an annual flu vaccine. She wants to think about all these. I have given her immunization information from the [facility] website.

## 2013-09-20 NOTE — Assessment & Plan Note (Addendum)
Responding nicely to prednisone To start AZA - 25 mg as she is a heterozygote TPMT Needs hepatitis vaccines if not immune - brief discussion today, I need to have her get hepatitis B surface antibody and hepatitis A antibody total will do next weeks labs rtc 2mos Autoimmune tests per her req vaccine info given Reviewed the need for chronic immunosuppressive therapy based upon current knowledge of autoimmune hepatitis, to reduce the risks of complications, mainly cirrhosis. 25 minutes time some of the patient and her spouse today A battery of other autoimmune test were run at the patient's request today. She wants to know she has serologies are consistent with things like Sjogren's, scleroderma or lupus. I told her this wasn't completely necessary but in her case and her anxiety over this setting is reasonable to do hopefully we will turned up anything positive. CC: Nelwyn Salisbury, MD and Cephas Darby, MD

## 2013-09-20 NOTE — Patient Instructions (Addendum)
  Your physician has requested that you go to the basement for lab work before leaving today.  Tell the lab not to draw the CBC/diff and LFT test today, that will be done in a week or so.   Today we are providing you with information to read on azathioprine and shingles/shingles vaccine.  We will discuss vaccines that you need at your next visit in 2 months.  Those include a flu vaccine, pneumonia, shingles and hepatitis vaccines.  We have sent the following medications to your pharmacy for you to pick up at your convenience: Azathioprine  Please take prednisone as follows:  30mg  daily for a week starting today, then 20mg  daily for 2 weeks, 15mg  daily for 2 weeks, 10mg  daily until next office visit.  I appreciate the opportunity to care for you.

## 2013-09-20 NOTE — Patient Instructions (Signed)
Continue Xarelto Return for MD visit on 12/15 with Lab appointment

## 2013-09-20 NOTE — Progress Notes (Signed)
  Subjective:    Patient ID: Natalie Mullen, female    DOB: 29-Jan-1966, 47 y.o.   MRN: [id number]  HPI Natalie Mullen is here to followup her autoimmune hepatitis. She has had a nice response with normalization of her LFTs after starting prednisone. She is feeling somewhat hyper and hungry and a little bit irritable at the prednisone. I explained that these are all normal side effects. She remains on 40 mg daily. She had set me a message wondering about being tested for other autoimmune markers because she hasn't had to have lupus and she is very concerned that she could have these problems. She says that if she gets these tests done and knows that there are negative then she will rest more easily and not be so worried about these problems. Medications, allergies, past medical history, past surgical history, family history and social history are reviewed and updated in the EMR.   Review of Systems As above, she is on Xarelto instead of  warfarin now.    Objective:   Physical Exam Well-developed [place] woman well-nourished no acute distress looks well    Assessment & Plan:   1. Autoimmune hepatitis   2. Long-term use of immunosuppressant medication

## 2013-09-21 ENCOUNTER — Telehealth: Payer: Self-pay | Admitting: *Deleted

## 2013-09-21 ENCOUNTER — Other Ambulatory Visit: Payer: Self-pay | Admitting: Internal Medicine

## 2013-09-21 DIAGNOSIS — K754 Autoimmune hepatitis: Secondary | ICD-10-CM

## 2013-09-21 LAB — ANTI-SCLERODERMA ANTIBODY: Scleroderma (Scl-70) (ENA) Antibody, IgG: <1 AU/mL (ref <30)

## 2013-09-21 LAB — JO-1 ANTIBODY-IGG: Jo-1 Antibody, IgG: <1 AU/mL (ref <30)

## 2013-09-21 LAB — EXTRACTABLE NUCLEAR ANTIGEN ANTIBODY
SSA (Ro) (ENA) Antibody, IgG: 1 AU/mL (ref <30)
SSB (La) (ENA) Antibody, IgG: <1 AU/mL (ref <30)
ds DNA Ab: 43 IU/mL — ABNORMAL HIGH (ref <30)

## 2013-09-21 NOTE — Telephone Encounter (Signed)
Called patient regarding normal liver test results, per Dr. Cyndie Chime. Patient verbalized understanding.

## 2013-09-26 ENCOUNTER — Telehealth: Payer: Self-pay | Admitting: Internal Medicine

## 2013-09-26 NOTE — Telephone Encounter (Signed)
Questions regarding when to do labs answered and verbalized her understanding.

## 2013-09-27 LAB — RNP ANTIBODY: Ribonucleic Protein(ENA) Antibody, IgG: <1 AI

## 2013-09-28 ENCOUNTER — Ambulatory Visit: Payer: BC Managed Care – PPO | Admitting: Internal Medicine

## 2013-09-29 ENCOUNTER — Encounter: Payer: Self-pay | Admitting: Internal Medicine

## 2013-10-01 ENCOUNTER — Encounter: Payer: Self-pay | Admitting: Internal Medicine

## 2013-10-02 ENCOUNTER — Other Ambulatory Visit: Payer: Self-pay

## 2013-10-02 ENCOUNTER — Encounter: Payer: Self-pay | Admitting: Internal Medicine

## 2013-10-02 ENCOUNTER — Other Ambulatory Visit (INDEPENDENT_AMBULATORY_CARE_PROVIDER_SITE_OTHER): Payer: BC Managed Care – PPO

## 2013-10-02 DIAGNOSIS — K754 Autoimmune hepatitis: Secondary | ICD-10-CM

## 2013-10-02 DIAGNOSIS — R7401 Elevation of levels of liver transaminase levels: Secondary | ICD-10-CM

## 2013-10-02 LAB — CBC WITH DIFFERENTIAL/PLATELET
Basophils Relative: 0.4 % (ref 0.0–3.0)
Eosinophils Absolute: 0 10*3/uL (ref 0.0–0.7)
HCT: 41.2 % (ref 36.0–46.0)
Hemoglobin: 14 g/dL (ref 12.0–15.0)
Lymphocytes Relative: 24 % (ref 12.0–46.0)
MCHC: 34 g/dL (ref 30.0–36.0)
Monocytes Relative: 6.1 % (ref 3.0–12.0)
Neutro Abs: 8.7 10*3/uL — ABNORMAL HIGH (ref 1.4–7.7)
RBC: 4.72 Mil/uL (ref 3.87–5.11)

## 2013-10-02 LAB — HEPATIC FUNCTION PANEL
AST: 20 U/L (ref 0–37)
Albumin: 3.7 g/dL (ref 3.5–5.2)
Alkaline Phosphatase: 55 U/L (ref 39–117)
Total Bilirubin: 1.2 mg/dL (ref 0.3–1.2)
Total Protein: 6.9 g/dL (ref 6.0–8.3)

## 2013-10-02 LAB — HEPATITIS B SURFACE ANTIBODY,QUALITATIVE: Hep B S Ab: POSITIVE — AB

## 2013-10-02 NOTE — Progress Notes (Signed)
Quick Note:  Notified via My Chart Needs CBC and LFT's again in 2 weeks ______

## 2013-10-04 NOTE — Progress Notes (Signed)
Quick Note:  She is naive to hepatitis A and should be vaccinated Can start anytime or when she sees me in f/u ______

## 2013-10-22 ENCOUNTER — Other Ambulatory Visit: Payer: Self-pay

## 2013-10-22 ENCOUNTER — Encounter: Payer: Self-pay | Admitting: Internal Medicine

## 2013-10-22 ENCOUNTER — Other Ambulatory Visit (INDEPENDENT_AMBULATORY_CARE_PROVIDER_SITE_OTHER): Payer: BC Managed Care – PPO

## 2013-10-22 DIAGNOSIS — R7401 Elevation of levels of liver transaminase levels: Secondary | ICD-10-CM

## 2013-10-22 DIAGNOSIS — K754 Autoimmune hepatitis: Secondary | ICD-10-CM

## 2013-10-22 LAB — CBC WITH DIFFERENTIAL/PLATELET
Basophils Relative: 0.4 % (ref 0.0–3.0)
Eosinophils Absolute: 0 10*3/uL (ref 0.0–0.7)
Eosinophils Relative: 0.1 % (ref 0.0–5.0)
Hemoglobin: 13.8 g/dL (ref 12.0–15.0)
Lymphocytes Relative: 11.6 % — ABNORMAL LOW (ref 12.0–46.0)
MCHC: 34.2 g/dL (ref 30.0–36.0)
MCV: 87.3 fl (ref 78.0–100.0)
Monocytes Absolute: 0.5 10*3/uL (ref 0.1–1.0)
Monocytes Relative: 4.2 % (ref 3.0–12.0)
Neutro Abs: 9.7 10*3/uL — ABNORMAL HIGH (ref 1.4–7.7)
Neutrophils Relative %: 83.7 % — ABNORMAL HIGH (ref 43.0–77.0)
RBC: 4.62 Mil/uL (ref 3.87–5.11)
WBC: 11.6 10*3/uL — ABNORMAL HIGH (ref 4.5–10.5)

## 2013-10-22 LAB — HEPATIC FUNCTION PANEL
ALT: 54 U/L — ABNORMAL HIGH (ref 0–35)
Albumin: 3.9 g/dL (ref 3.5–5.2)
Alkaline Phosphatase: 55 U/L (ref 39–117)
Total Protein: 7.3 g/dL (ref 6.0–8.3)

## 2013-10-27 ENCOUNTER — Encounter: Payer: Self-pay | Admitting: Internal Medicine

## 2013-10-29 ENCOUNTER — Encounter: Payer: Self-pay | Admitting: Internal Medicine

## 2013-11-02 ENCOUNTER — Other Ambulatory Visit (INDEPENDENT_AMBULATORY_CARE_PROVIDER_SITE_OTHER): Payer: BC Managed Care – PPO

## 2013-11-02 DIAGNOSIS — K754 Autoimmune hepatitis: Secondary | ICD-10-CM

## 2013-11-02 LAB — CBC WITH DIFFERENTIAL/PLATELET
Basophils Absolute: 0.1 10*3/uL (ref 0.0–0.1)
Eosinophils Absolute: 0 10*3/uL (ref 0.0–0.7)
Eosinophils Relative: 0.1 % (ref 0.0–5.0)
Hemoglobin: 13.3 g/dL (ref 12.0–15.0)
Lymphocytes Relative: 22.7 % (ref 12.0–46.0)
MCHC: 34.1 g/dL (ref 30.0–36.0)
Monocytes Relative: 7 % (ref 3.0–12.0)
Neutro Abs: 8.6 10*3/uL — ABNORMAL HIGH (ref 1.4–7.7)
Neutrophils Relative %: 69.6 % (ref 43.0–77.0)
Platelets: 394 10*3/uL (ref 150.0–400.0)
RDW: 15.2 % — ABNORMAL HIGH (ref 11.5–14.6)

## 2013-11-02 LAB — HEPATIC FUNCTION PANEL
ALT: 127 U/L — ABNORMAL HIGH (ref 0–35)
AST: 76 U/L — ABNORMAL HIGH (ref 0–37)
Alkaline Phosphatase: 59 U/L (ref 39–117)
Bilirubin, Direct: 0.1 mg/dL (ref 0.0–0.3)
Total Bilirubin: 0.9 mg/dL (ref 0.3–1.2)

## 2013-11-05 ENCOUNTER — Encounter: Payer: Self-pay | Admitting: Oncology

## 2013-11-05 ENCOUNTER — Other Ambulatory Visit (HOSPITAL_BASED_OUTPATIENT_CLINIC_OR_DEPARTMENT_OTHER): Payer: BC Managed Care – PPO

## 2013-11-05 ENCOUNTER — Ambulatory Visit (HOSPITAL_BASED_OUTPATIENT_CLINIC_OR_DEPARTMENT_OTHER): Payer: BC Managed Care – PPO | Admitting: Oncology

## 2013-11-05 VITALS — BP 129/70 | HR 71 | Temp 97.6°F | Resp 18 | Ht 66.0 in | Wt 143.3 lb

## 2013-11-05 DIAGNOSIS — Z8673 Personal history of transient ischemic attack (TIA), and cerebral infarction without residual deficits: Secondary | ICD-10-CM

## 2013-11-05 DIAGNOSIS — K754 Autoimmune hepatitis: Secondary | ICD-10-CM

## 2013-11-05 DIAGNOSIS — D689 Coagulation defect, unspecified: Secondary | ICD-10-CM

## 2013-11-05 DIAGNOSIS — I639 Cerebral infarction, unspecified: Secondary | ICD-10-CM

## 2013-11-05 DIAGNOSIS — D6861 Antiphospholipid syndrome: Secondary | ICD-10-CM

## 2013-11-05 DIAGNOSIS — R894 Abnormal immunological findings in specimens from other organs, systems and tissues: Secondary | ICD-10-CM

## 2013-11-05 DIAGNOSIS — Z79899 Other long term (current) drug therapy: Secondary | ICD-10-CM

## 2013-11-05 DIAGNOSIS — Z7901 Long term (current) use of anticoagulants: Secondary | ICD-10-CM

## 2013-11-05 DIAGNOSIS — R760 Raised antibody titer: Secondary | ICD-10-CM

## 2013-11-05 DIAGNOSIS — I6350 Cerebral infarction due to unspecified occlusion or stenosis of unspecified cerebral artery: Secondary | ICD-10-CM

## 2013-11-05 HISTORY — DX: Long term (current) use of anticoagulants: Z79.01

## 2013-11-05 LAB — CBC & DIFF AND RETIC
BASO%: 0.3 % (ref 0.0–2.0)
Basophils Absolute: 0 10*3/uL (ref 0.0–0.1)
EOS%: 0 % (ref 0.0–7.0)
HCT: 41 % (ref 34.8–46.6)
HGB: 14 g/dL (ref 11.6–15.9)
LYMPH%: 16.4 % (ref 14.0–49.7)
MCH: 29.7 pg (ref 25.1–34.0)
MCHC: 34.1 g/dL (ref 31.5–36.0)
MCV: 86.9 fL (ref 79.5–101.0)
NEUT%: 78.8 % — ABNORMAL HIGH (ref 38.4–76.8)
Platelets: 329 10*3/uL (ref 145–400)
Retic Ct Abs: 76.94 10*3/uL (ref 33.70–90.70)
lymph#: 1.9 10*3/uL (ref 0.9–3.3)
nRBC: 0 % (ref 0–0)

## 2013-11-05 LAB — COMPREHENSIVE METABOLIC PANEL (CC13)
ALT: 267 U/L — ABNORMAL HIGH (ref 0–55)
AST: 169 U/L — ABNORMAL HIGH (ref 5–34)
Albumin: 3.8 g/dL (ref 3.5–5.0)
Alkaline Phosphatase: 71 U/L (ref 40–150)
Anion Gap: 10 mEq/L (ref 3–11)
CO2: 25 mEq/L (ref 22–29)
Creatinine: 0.9 mg/dL (ref 0.6–1.1)
Total Bilirubin: 0.87 mg/dL (ref 0.20–1.20)

## 2013-11-05 LAB — SEDIMENTATION RATE: Sed Rate: 7 mm/hr (ref 0–22)

## 2013-11-06 ENCOUNTER — Telehealth: Payer: Self-pay | Admitting: Internal Medicine

## 2013-11-06 NOTE — Telephone Encounter (Signed)
Dr. Leone Payor please review her labs and she cancelled her appt for Friday.  She is scheduled for January.

## 2013-11-06 NOTE — Progress Notes (Signed)
Hematology and Oncology Follow Up Visit  Armandina Iman [id number] May 15, 1966 47 y.o. 11/06/2013 8:39 AM   Principle Diagnosis: Encounter Diagnoses  Name Primary?  Marland Kitchen Anticardiolipin antibody positive Yes  . History of ischemic left MCA stroke   . CVA (cerebral vascular accident)   . Autoimmune hepatitis   . Long-term use of immunosuppressant medication azathioprine   . Chronic anticoagulation      Interim History:  Follow up visit for this pleasant 47 year old woman who presented with acute neurologic symptoms in July of 2011 and was found to have a left middle cerebral artery stroke. Further evaluation revealed an underlying coagulopathy: Antiphospholipid antibody syndrome. Please see my detailed summary note dated 10/06/2012 14 details. She underwent  initial treatment of the acute stroke with thrombolytic therapy and thrombectomy and had a complete neurologic recovery. She has been maintained on full dose Coumadin anticoagulation but secondary to erratic protime results from the presence of antiphospholipid antibodies, anticoagulation levels have been monitored by a chromogenic factor X a level done at [facility] coagulation lab.  She recently presented with asymptomatic but significant transaminitis. She underwent a liver biopsy on 09/03/2013. Findings were consistent with autoimmune hepatitis. She was started on a course of steroids. I stopped her Coumadin for the biopsy and then I changed her to Xarelto anticoagulation. She has tolerated this product well. She has had significant reduction in episodes of epistaxis that she was having on the Coumadin.  She had an initial prompt normalization of her liver functions on prednisone and azathioprine. She has been tapered down to current dose of 15 mg. Unfortunately, labs done today and not available until the patient left the office, showed deterioration in the liver functions. 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  She reports no new neurologic symptoms. No headache. No change in vision. No focal weakness. No dysarthria.  She is recently divorced. She is accompanied by a female friend today.    Medications: reviewed  Allergies: No Known Allergies  Review of Systems: Hematology:  Decreased epistaxis since switching from Coumadin to Xarelto ENT ROS: No sore throat Breast ROS:  Respiratory ROS: No cough or dyspnea Cardiovascular ROS:   No chest pain or palpitations Gastrointestinal ROS:   No abdominal pain or change in bowel habit Genito-Urinary ROS no urinary tract symptoms Musculoskeletal ROS: She's been having pain in her hands and in her leg muscles. This started with concomitant initiation of Xarelto, steroids and azathioprine Neurological ROS: No headache or change in vision. No focal weakness. No slurred speech. Dermatological ROS: No rash Remaining ROS negative.  Physical Exam: Blood pressure 129/70, pulse 71, temperature 97.6 F (36.4 C), temperature source Oral, resp. rate 18, height 5\' 6"  (1.676 m), weight 143 lb 4.8 oz (65 kg). Wt Readings from Last 3 Encounters:  11/05/13 143 lb 4.8 oz (65 kg)  09/20/13 141 lb (63.957 kg)  08/27/13 139 lb 6.4 oz (63.231 kg)     General appearance: Well-nourished woman now markedly cushingoid face HENNT: Pharynx no erythema, exudate, mass, or ulcer. No thyromegaly or thyroid nodules Lymph nodes: No cervical, supraclavicular, or axillary lymphadenopathy Breasts:  Lungs: Clear to auscultation, resonant to percussion throughout Heart: Regular rhythm, no murmur, no gallop, no rub, no click, no edema Abdomen: Soft, nontender, normal bowel sounds, no mass, no organomegaly Extremities: No edema, no calf tenderness Musculoskeletal: no joint deformities GU:  Vascular: Carotid pulses 2+, no bruits, Neurologic: Alert, oriented, PERRLA, optic discs sharp and vessels normal, no hemorrhage or exudate, cranial  nerves grossly normal, motor  strength 5 over 5, reflexes 1+ symmetric, upper body coordination normal, gait normal, Skin: No rash or ecchymosis  Lab Results: CBC W/Diff    Component Value Date/Time   WBC 11.4* 11/05/2013 1404   WBC 12.3* 11/02/2013 1638   RBC 4.72 11/05/2013 1404   RBC 4.47 11/02/2013 1638   HGB 14.0 11/05/2013 1404   HGB 13.3 11/02/2013 1638   HCT 41.0 11/05/2013 1404   HCT 39.0 11/02/2013 1638   PLT 329 11/05/2013 1404   PLT 394.0 11/02/2013 1638   MCV 86.9 11/05/2013 1404   MCV 87.3 11/02/2013 1638   MCH 29.7 11/05/2013 1404   MCH 28.9 09/03/2013 0850   MCHC 34.1 11/05/2013 1404   MCHC 34.1 11/02/2013 1638   RDW 14.6* 11/05/2013 1404   RDW 15.2* 11/02/2013 1638   LYMPHSABS 1.9 11/05/2013 1404   LYMPHSABS 2.8 11/02/2013 1638   MONOABS 0.5 11/05/2013 1404   MONOABS 0.9 11/02/2013 1638   EOSABS 0.0 11/05/2013 1404   EOSABS 0.0 11/02/2013 1638   BASOSABS 0.0 11/05/2013 1404   BASOSABS 0.1 11/02/2013 1638     Chemistry      Component Value Date/Time   NA 138 11/05/2013 1404   NA 134* 08/16/2013 1229   K 3.9 11/05/2013 1404   K 3.7 08/16/2013 1229   CL 100 08/16/2013 1229   CL 105 04/03/2013 0849   CO2 25 11/05/2013 1404   CO2 26 08/16/2013 1229   BUN 11.7 11/05/2013 1404   BUN 10 08/16/2013 1229   CREATININE 0.9 11/05/2013 1404   CREATININE 0.8 08/16/2013 1229      Component Value Date/Time   CALCIUM 9.9 11/05/2013 1404   CALCIUM 9.4 08/16/2013 1229   ALKPHOS 71 11/05/2013 1404   ALKPHOS 59 11/02/2013 1638   AST 169* 11/05/2013 1404   AST 76* 11/02/2013 1638   ALT 267* 11/05/2013 1404   ALT 127* 11/02/2013 1638   BILITOT 0.87 11/05/2013 1404   BILITOT 0.9 11/02/2013 1638      Impression:  #1. 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 Status post left MCA stroke secondary to #1  #3. Chronic anticoagulation secondary to #1 and 2 We had a lengthy discussion about the use of the new oral anticoagulants in patients antiphospholipid antibody syndrome.  There is really no literature available with which to make educated decisions. I have recently updated myself with experts in the field at our national meeting last week. The same experts who recently felt these agents would be reasonable in this patient group are now urging more caution. What we need is a data but I doubt there will be any prospective trials. The only place where the new anticoagulant have failed so far is in prevention of thrombosis on mechanical heart valves. I think antiphospholipid antibody syndrome is a different subset of patients and I don't have any reason to doubt that the new oral anticoagulants won't be as good as the traditional warfarin in preventing rethrombosis. Unfortunately, all of the clinical trials using warfarin and the new anticoagulants used rethrombosis and bleeding as end points. No one really assesses adequacy of anticoagulation only rethrombosis rate. A recurrent thrombotic event is obviously something we want to avoid at all costs especially given this lady's history. At the end of the discussion, we mutually agreed to continue the Xarelto since it has less chance of interacting with her new medications and does not require intensive laboratory monitoring.  #4. New diagnosis autoimmune hepatitis [place] as  steroids tapered but significant side effects from steroids. Allergy to alert Dr. Leone Payor, her gastroenterologist. 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 She had a positive ANA in the past. This, in conjunction with the elevated  antiphospholipid antibodies are consistent.  Over 40 minutes spent with more than 50% and direct counseling and face-to-face patient contact.  CC: Patient Care Team: Nelwyn Salisbury, MD as PCP - General   Levert Feinstein, MD 12/16/20148:39 AM

## 2013-11-07 ENCOUNTER — Telehealth: Payer: Self-pay | Admitting: Internal Medicine

## 2013-11-07 ENCOUNTER — Other Ambulatory Visit (INDEPENDENT_AMBULATORY_CARE_PROVIDER_SITE_OTHER): Payer: BC Managed Care – PPO

## 2013-11-07 ENCOUNTER — Encounter: Payer: Self-pay | Admitting: Internal Medicine

## 2013-11-07 DIAGNOSIS — K754 Autoimmune hepatitis: Secondary | ICD-10-CM

## 2013-11-07 LAB — HEPATIC FUNCTION PANEL
AST: 183 U/L — ABNORMAL HIGH (ref 0–37)
Alkaline Phosphatase: 63 U/L (ref 39–117)
Bilirubin, Direct: 0.2 mg/dL (ref 0.0–0.3)
Total Bilirubin: 0.9 mg/dL (ref 0.3–1.2)
Total Protein: 6.8 g/dL (ref 6.0–8.3)

## 2013-11-07 MED ORDER — AZATHIOPRINE 50 MG PO TABS: ORAL_TABLET | ORAL | Status: DC

## 2013-11-07 NOTE — Telephone Encounter (Signed)
Patient aware of all the above. Referral is sent to [facility] for review and scheduling.  She is willing to travel to [facility] for an appt, whichever is fastest.  She will come today for the lab work.  She is aware that [facility] will be in contact with her about scheduling an appt.

## 2013-11-07 NOTE — Telephone Encounter (Signed)
I called her and explained that LFTs are up so not a good time to lower prednisone dose. She is concerned because she is becoming cushingoid.  Plans:  1) Come today for thiopurine metabolities and LFT's 2) hold Azathioprine 3) continue prednisone at 15 mg daily 4) will call back with results -  5) Referral to Dr. [facility] to be seen in [facility] or [place] re: autoimmune hepatitis

## 2013-11-08 ENCOUNTER — Encounter: Payer: Self-pay | Admitting: Internal Medicine

## 2013-11-09 ENCOUNTER — Ambulatory Visit: Payer: BC Managed Care – PPO | Admitting: Internal Medicine

## 2013-11-19 LAB — THIOPURINE METABOLITES: 6-MMPN Metaboilte: <237 pmol/8x 10E8

## 2013-11-20 ENCOUNTER — Other Ambulatory Visit: Payer: Self-pay

## 2013-11-20 ENCOUNTER — Encounter: Payer: Self-pay | Admitting: Internal Medicine

## 2013-11-20 ENCOUNTER — Other Ambulatory Visit (INDEPENDENT_AMBULATORY_CARE_PROVIDER_SITE_OTHER): Payer: BC Managed Care – PPO

## 2013-11-20 DIAGNOSIS — K7689 Other specified diseases of liver: Secondary | ICD-10-CM

## 2013-11-20 LAB — HEPATIC FUNCTION PANEL
ALT: 66 U/L — ABNORMAL HIGH (ref 0–35)
AST: 23 U/L (ref 0–37)
Albumin: 4 g/dL (ref 3.5–5.2)
Bilirubin, Direct: 0.1 mg/dL (ref 0.0–0.3)
Total Bilirubin: 1.1 mg/dL (ref 0.3–1.2)
Total Protein: 6.9 g/dL (ref 6.0–8.3)

## 2013-11-20 NOTE — Progress Notes (Signed)
Quick Note:  These test results suggest that the problem was NOT from azathioprine Please have her get another set of LFT's this week or next week Have we gotten her an appt w/ Dr. Julieta Gutting? ______

## 2013-11-20 NOTE — Progress Notes (Signed)
Quick Note:  OK Presume she has had records sent Make sure recent labs including these go - she could hand carry If not already done she should get liver biopsy slides to take to [facility] ______

## 2013-11-20 NOTE — Progress Notes (Signed)
Quick Note:  LFT's almost back to normal May decrease prednisone to 10 mg daily but needs to stay on Keep appt [facility] 1/5 ______

## 2013-11-25 ENCOUNTER — Encounter: Payer: Self-pay | Admitting: Internal Medicine

## 2013-12-04 ENCOUNTER — Other Ambulatory Visit: Payer: BC Managed Care – PPO

## 2013-12-07 ENCOUNTER — Ambulatory Visit: Payer: BC Managed Care – PPO | Admitting: Internal Medicine

## 2014-01-21 ENCOUNTER — Encounter: Payer: Self-pay | Admitting: Oncology

## 2014-02-07 ENCOUNTER — Other Ambulatory Visit: Payer: Self-pay | Admitting: *Deleted

## 2014-02-07 DIAGNOSIS — D6861 Antiphospholipid syndrome: Secondary | ICD-10-CM

## 2014-02-07 MED ORDER — RIVAROXABAN 20 MG PO TABS: 20.0000 mg | ORAL_TABLET | Freq: Every day | ORAL | Status: DC

## 2014-02-25 ENCOUNTER — Other Ambulatory Visit: Payer: Self-pay | Admitting: Family Medicine

## 2014-02-25 ENCOUNTER — Encounter: Payer: BC Managed Care – PPO | Admitting: Oncology

## 2014-02-26 ENCOUNTER — Ambulatory Visit (INDEPENDENT_AMBULATORY_CARE_PROVIDER_SITE_OTHER): Payer: BC Managed Care – PPO | Admitting: Oncology

## 2014-02-26 ENCOUNTER — Encounter: Payer: Self-pay | Admitting: Oncology

## 2014-02-26 VITALS — BP 112/65 | HR 58 | Temp 97.0°F | Resp 20 | Ht 66.5 in | Wt 137.7 lb

## 2014-02-26 DIAGNOSIS — Z8673 Personal history of transient ischemic attack (TIA), and cerebral infarction without residual deficits: Secondary | ICD-10-CM

## 2014-02-26 DIAGNOSIS — N949 Unspecified condition associated with female genital organs and menstrual cycle: Secondary | ICD-10-CM

## 2014-02-26 DIAGNOSIS — N938 Other specified abnormal uterine and vaginal bleeding: Secondary | ICD-10-CM

## 2014-02-26 DIAGNOSIS — Z79899 Other long term (current) drug therapy: Secondary | ICD-10-CM

## 2014-02-26 DIAGNOSIS — Z7901 Long term (current) use of anticoagulants: Secondary | ICD-10-CM

## 2014-02-26 DIAGNOSIS — D6859 Other primary thrombophilia: Secondary | ICD-10-CM

## 2014-02-26 DIAGNOSIS — D6861 Antiphospholipid syndrome: Secondary | ICD-10-CM

## 2014-02-26 DIAGNOSIS — Z7960 Long term (current) use of unspecified immunomodulators and immunosuppressants: Secondary | ICD-10-CM

## 2014-02-26 DIAGNOSIS — K754 Autoimmune hepatitis: Secondary | ICD-10-CM

## 2014-02-26 MED ORDER — AMOXICILLIN-POT CLAVULANATE 875-125 MG PO TABS: 1.0000 | ORAL_TABLET | Freq: Two times a day (BID) | ORAL | Status: AC

## 2014-02-26 NOTE — Patient Instructions (Addendum)
Continue Xarelto and low dose aspirin Follow up with Dr Cyndie ChimeGranfortuna at [facility] in 6 months: October 6

## 2014-02-26 NOTE — Progress Notes (Signed)
Patient ID: Natalie Mullen, female   DOB: 10-Nov-1966, 48 y.o.   MRN: [id number] Hematology and Oncology Follow Up Visit  Natalie Mullen [id number] 07-09-1966 48 y.o. 02/26/2014 3:48 PM   Principle Diagnosis: Encounter Diagnoses  Name Primary?  . Chronic anticoagulation Yes  . Long-term use of immunosuppressant medication azathioprine   . Autoimmune hepatitis   . Antiphospholipid antibody with hypercoagulable state   . History of ischemic left MCA stroke      Interim History:   Follow up visit for this pleasant 48 year old woman who presented with acute neurologic symptoms in July of 2011 and was found to have a left middle cerebral artery stroke. Further evaluation revealed an underlying coagulopathy: Antiphospholipid antibody syndrome. Please see my detailed summary note dated 10/06/2012 for full details.  She underwent initial treatment of the acute stroke with thrombolytic therapy and thrombectomy and had a complete neurologic recovery.  She has been maintained on full dose Coumadin anticoagulation but secondary to erratic protime results from the presence of antiphospholipid antibodies, anticoagulation levels have been monitored by a chromogenic factor X a level done at [facility] coagulation lab. I recently decided to change her over from Coumadin to Xarelto secondary to events that will be summarized below. She is very happy that she has been able to liberalize her diet.  She  presented in September 2014 with asymptomatic but significant transaminitis with enzyme levels over 1000. She underwent a liver biopsy on 09/03/2013. Findings were consistent with autoimmune hepatitis. She was started on a course of steroids. I stopped her Coumadin for the biopsy and then I changed her to Xarelto anticoagulation. She has tolerated this product well. She has had significant reduction in episodes of epistaxis that she was having on the Coumadin. She is still having heavy menstrual cycles and  erratic bleeding. She had an initial prompt normalization of her liver functions on prednisone and azathioprine but at time of visit in December 2014 liver functions had deteriorated. Prednisone and azathioprine doses were increased. She is currently on 100 mg of azathioprine daily. Liver enzymes have normalized again. Prednisone has been been tapered down to current dose of 5 mg.  She was initially evaluated by Dr. Stan Head. She is now being managed by a gastroenterologist at [facility] Dr. Brunilda Payor;  she just saw him yesterday. She brought a copy of her lab reports which look quite good. Lab done 02/19/2014 with bilirubin 0.7, SGOT 17, SGPT 10, albumin 4.4, hemoglobin 12.9, white count 8600, platelets 349,000. She has become significantly cushingoid on the steroids but this is improving as the steroid doses are tapered.  She reports no new neurologic symptoms. No headache. No change in vision. No focal weakness. No dysarthria. She is having dysfunctional uterine bleeding periods are irregular and heavy. I suggested that she get a followup visit with her gynecologist.          Medications: reviewed  Allergies: No Known Allergies  Review of Systems: Hematology:  See above ENT ROS: No sore throat Breast ROS:  Respiratory ROS: No cough or dyspnea Cardiovascular ROS:  No chest pain or palpitations Gastrointestinal ROS:  See above  Genito-Urinary ROS: See above Musculoskeletal ROS: No muscle bone or joint pain Neurological ROS: See above Dermatological ROS: No rash or ecchymosis Remaining ROS negative:   Physical Exam: Blood pressure 112/65, pulse 58, temperature 97 F (36.1 C), temperature source Oral, resp. rate 20, height 5' 6.5" (1.689 m), weight 137 lb 11.2 oz (62.46 kg), last menstrual  period 02/20/2014, SpO2 100.00%. Wt Readings from Last 3 Encounters:  02/26/14 137 lb 11.2 oz (62.46 kg)  11/05/13 143 lb 4.8 oz (65 kg)  09/20/13 141 lb (63.957 kg)      General appearance: Pleasant young woman HENNT: Pharynx no erythema, exudate, mass, or ulcer. No thyromegaly or thyroid nodules Lymph nodes: No cervical, supraclavicular, or axillary lymphadenopathy Breasts: Lungs: Clear to auscultation, resonant to percussion throughout Heart: Regular rhythm, no murmur, no gallop, no rub, no click, no edema Abdomen: Soft, nontender, normal bowel sounds, no mass, no organomegaly Extremities: No edema, no calf tenderness Musculoskeletal: no joint deformities GU:  Vascular: Carotid pulses 2+, no bruits,  Neurologic: Alert, oriented, PERRLA, optic discs sharp and vessels normal, no hemorrhage or exudate, cranial nerves grossly normal, motor strength 5 over 5, reflexes 1+ symmetric, upper body coordination normal, gait normal, Skin: No rash or ecchymosis  Lab Results: CBC W/Diff    Component Value Date/Time   WBC 11.4* 11/05/2013 1404   WBC 12.3* 11/02/2013 1638   RBC 4.72 11/05/2013 1404   RBC 4.47 11/02/2013 1638   HGB 14.0 11/05/2013 1404   HGB 13.3 11/02/2013 1638   HCT 41.0 11/05/2013 1404   HCT 39.0 11/02/2013 1638   PLT 329 11/05/2013 1404   PLT 394.0 11/02/2013 1638   MCV 86.9 11/05/2013 1404   MCV 87.3 11/02/2013 1638   MCH 29.7 11/05/2013 1404   MCH 28.9 09/03/2013 0850   MCHC 34.1 11/05/2013 1404   MCHC 34.1 11/02/2013 1638   RDW 14.6* 11/05/2013 1404   RDW 15.2* 11/02/2013 1638   LYMPHSABS 1.9 11/05/2013 1404   LYMPHSABS 2.8 11/02/2013 1638   MONOABS 0.5 11/05/2013 1404   MONOABS 0.9 11/02/2013 1638   EOSABS 0.0 11/05/2013 1404   EOSABS 0.0 11/02/2013 1638   BASOSABS 0.0 11/05/2013 1404   BASOSABS 0.1 11/02/2013 1638     Chemistry      Component Value Date/Time   NA 138 11/05/2013 1404   NA 134* 08/16/2013 1229   K 3.9 11/05/2013 1404   K 3.7 08/16/2013 1229   CL 100 08/16/2013 1229   CL 105 04/03/2013 0849   CO2 25 11/05/2013 1404   CO2 26 08/16/2013 1229   BUN 11.7 11/05/2013 1404   BUN 10 08/16/2013 1229   CREATININE  0.9 11/05/2013 1404   CREATININE 0.8 08/16/2013 1229      Component Value Date/Time   CALCIUM 9.9 11/05/2013 1404   CALCIUM 9.4 08/16/2013 1229   ALKPHOS 52 11/20/2013 1616   ALKPHOS 71 11/05/2013 1404   AST 23 11/20/2013 1616   AST 169* 11/05/2013 1404   ALT 66* 11/20/2013 1616   ALT 267* 11/05/2013 1404   BILITOT 1.1 11/20/2013 1616   BILITOT 0.87 11/05/2013 1404       Impression:  #1. 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 Status post left MCA stroke secondary to #1.  #3. Chronic anticoagulation now on Xarelto 20 mg daily. We discussed how important it is to stay on chronic anticoagulation in somebody who has already had a life-threatening blood clots and to has been determined to have a ongoing risk factor for thrombosis in the form of antiphospholipid antibody syndrome. It is clear, in view of the recent development of autoimmune hepatitis, that she has generalized immune dysfunction.  #4. Autoimmune hepatitis Responding to steroids and Imuran. She was concerned with possibility that Imuran is carcinogenic. I reassured her that there is an extremely low possibility that this drug would cause a  secondary malignancy.  #5. Dysfunctional uterine bleeding She will be 48 years old next month. She has been on anticoagulation now for a number of years but is approaching the menopause and has had other insults to her body with need to take steroids and other immunosuppressive drugs. I recommended that she get a followup appointment with her gynecologist who she has not seen in about 3 years. Since she is fully anticoagulated, I think the thrombotic risk of low dose progestational agents would be very low if required to control her cycles.   CC: Patient Care Team: Nelwyn Salisbury, MD as PCP - General Levert Feinstein, MD as Consulting Physician (Oncology) Iva Boop, MD as Consulting Physician (Gastroenterology)   Levert Feinstein, MD 4/7/20153:48  PM

## 2014-03-08 ENCOUNTER — Other Ambulatory Visit: Payer: Self-pay | Admitting: Oncology

## 2014-03-12 ENCOUNTER — Encounter: Payer: Self-pay | Admitting: Internal Medicine

## 2014-03-31 ENCOUNTER — Encounter: Payer: Self-pay | Admitting: Oncology

## 2014-04-01 ENCOUNTER — Telehealth: Payer: Self-pay | Admitting: Family Medicine

## 2014-04-01 ENCOUNTER — Encounter: Payer: Self-pay | Admitting: Family Medicine

## 2014-04-01 ENCOUNTER — Ambulatory Visit (INDEPENDENT_AMBULATORY_CARE_PROVIDER_SITE_OTHER): Payer: BC Managed Care – PPO | Admitting: Family Medicine

## 2014-04-01 VITALS — BP 110/78 | HR 76 | Temp 98.3°F | Ht 66.5 in | Wt 134.0 lb

## 2014-04-01 DIAGNOSIS — R51 Headache: Secondary | ICD-10-CM

## 2014-04-01 DIAGNOSIS — R894 Abnormal immunological findings in specimens from other organs, systems and tissues: Secondary | ICD-10-CM

## 2014-04-01 DIAGNOSIS — Z79899 Other long term (current) drug therapy: Secondary | ICD-10-CM

## 2014-04-01 DIAGNOSIS — Z8673 Personal history of transient ischemic attack (TIA), and cerebral infarction without residual deficits: Secondary | ICD-10-CM

## 2014-04-01 DIAGNOSIS — Z7960 Long term (current) use of unspecified immunomodulators and immunosuppressants: Secondary | ICD-10-CM

## 2014-04-01 DIAGNOSIS — R760 Raised antibody titer: Secondary | ICD-10-CM

## 2014-04-01 MED ORDER — TRAMADOL HCL 50 MG PO TABS: 100.0000 mg | ORAL_TABLET | Freq: Four times a day (QID) | ORAL | Status: DC | PRN

## 2014-04-01 NOTE — Progress Notes (Signed)
   Subjective:    Patient ID: Natalie Mullen, female    DOB: 1965/12/22, 48 y.o.   MRN: [id number]  HPI Here for 3 weeks of headaches, nausea, and decreased appetite. She describes a pressure sensation across the forehead and around both eyes that she attributes to sinus congestion. She has been blowing some clear mucus from the nose. She gets some relief from using a Neti Pot several times a day. No ear pain or PND or ST or cough. No change in vision or light sensitivity. She also describes a "pounding" headache on the top of the head when she moves her head quickly side to side or when she bends forward. This lasts a few seconds and then stops. She also describes a pain that started in the right lower leg a week ago and then stopped. Now she has a similar pain in the left lower leg for  a week. No swelling. She has been on Azathioprine per Dr. Cyndie ChimeGranfortuna for about 4 months. She has had not had any imaging of her head for the past 4 years, ever since [place] in [place].    Review of Systems  Constitutional: Positive for appetite change and fatigue. Negative for fever, chills, diaphoresis, activity change and unexpected weight change.  HENT: Positive for congestion and sinus pressure. Negative for ear pain, facial swelling, postnasal drip and rhinorrhea.   Eyes: Negative.   Respiratory: Negative.   Cardiovascular: Negative.   Gastrointestinal: Negative.   Musculoskeletal: Positive for myalgias. Negative for arthralgias, back pain, gait problem, joint swelling, neck pain and neck stiffness.  Skin: Negative.        Objective:   Physical Exam  Constitutional: She is oriented to person, place, and time. She appears well-developed and well-nourished. No distress.  HENT:  Right Ear: External ear normal.  Left Ear: External ear normal.  Nose: Nose normal.  Mouth/Throat: Oropharynx is clear and moist.  Eyes: Conjunctivae and EOM are normal. Pupils are equal, round, and reactive to light.    Neck: Neck supple. No thyromegaly present.  Cardiovascular: Normal rate, regular rhythm, normal heart sounds and intact distal pulses.   Pulmonary/Chest: Effort normal and breath sounds normal.  Musculoskeletal: Normal range of motion. She exhibits no edema and no tenderness.  Lymphadenopathy:    She has no cervical adenopathy.  Neurological: She is alert and oriented to person, place, and time. She has normal reflexes. No cranial nerve deficit. She exhibits normal muscle tone. Coordination normal.          Assessment & Plan:  Her head congestion and headaches could simply be related to allergies so I suggested she take Mucinex D bid for the next week. With her hx of a stroke however we will set up a head CT this week.

## 2014-04-01 NOTE — Progress Notes (Signed)
Pre visit review using our clinic review tool, if applicable. No additional management support is needed unless otherwise documented below in the visit note. 

## 2014-04-01 NOTE — Telephone Encounter (Signed)
Patient Information:  Caller Name: Natalie Mullen  Phone: [phone]  Patient: Natalie Mullen, Natalie Mullen  Gender: Female  DOB: 03/05/1956  Age: 48 Years  PCP: Gershon CraneFry, [facility](Family Practice)  Office Follow Up:  Does the office need to follow up with this patient?: No  Instructions For The Office: N/A  RN Note:  Pt agrees to OV today  Symptoms  Reason For Call & Symptoms: Pt reports that she has been present since 5/9.  Pt reports that she feels dizziness, "If I move head she has a beating sensation" Vision is WNL, Vomiting has been present   Afebrile.  Reviewed Health History In EMR: Yes  Reviewed Medications In EMR: Yes  Reviewed Allergies In EMR: Yes  Reviewed Surgeries / Procedures: Yes  Date of Onset of Symptoms: 03/30/2014  Guideline(s) Used:  Dizziness  Disposition Per Guideline:   See Today in Office  Reason For Disposition Reached:   Vomiting occurs with dizziness  Advice Given:  Call Back If:  You become worse.  Patient Will Follow Care Advice:  YES  Appointment Scheduled:  04/01/2014 11:30:00 Appointment Scheduled Provider:  Gershon CraneFry, [facility](Family Practice)

## 2014-04-01 NOTE — Telephone Encounter (Signed)
Noted  

## 2014-04-03 ENCOUNTER — Ambulatory Visit (INDEPENDENT_AMBULATORY_CARE_PROVIDER_SITE_OTHER)
Admission: RE | Admit: 2014-04-03 | Discharge: 2014-04-03 | Disposition: A | Discharge status: Home / Self Care | Payer: BC Managed Care – PPO | Source: Ambulatory Visit | Attending: Family Medicine | Admitting: Family Medicine

## 2014-04-03 DIAGNOSIS — R51 Headache: Secondary | ICD-10-CM

## 2014-04-05 ENCOUNTER — Other Ambulatory Visit: Payer: Self-pay | Admitting: Oncology

## 2014-04-14 ENCOUNTER — Other Ambulatory Visit: Payer: Self-pay | Admitting: Oncology

## 2014-05-15 ENCOUNTER — Other Ambulatory Visit: Payer: Self-pay | Admitting: Oncology

## 2014-05-16 IMAGING — US US ABDOMEN COMPLETE
1 series · 14 of 25 positions shown · non-contrast
Comparison: CT 06/02/2011

CLINICAL DATA: Elevated liver function tests.

EXAM:
ULTRASOUND ABDOMEN COMPLETE

[Series 1: us abdomen complete · 0.25mm/px · 14 of 87 slices shown]
[im 1/87]
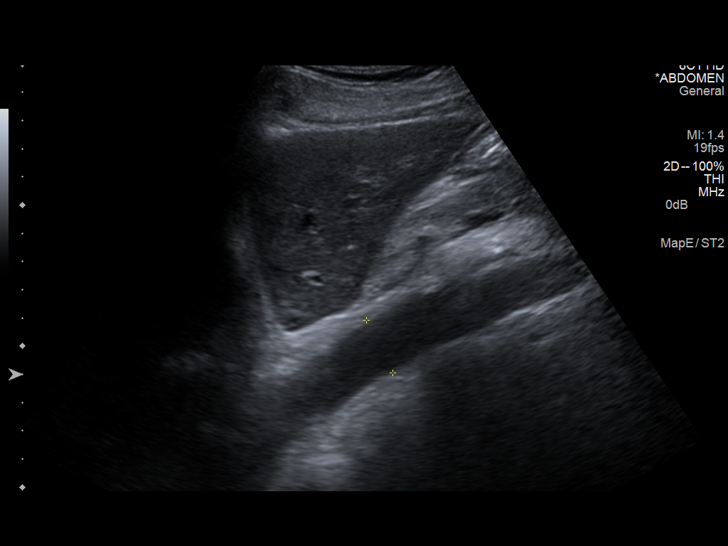
[im 8/87]
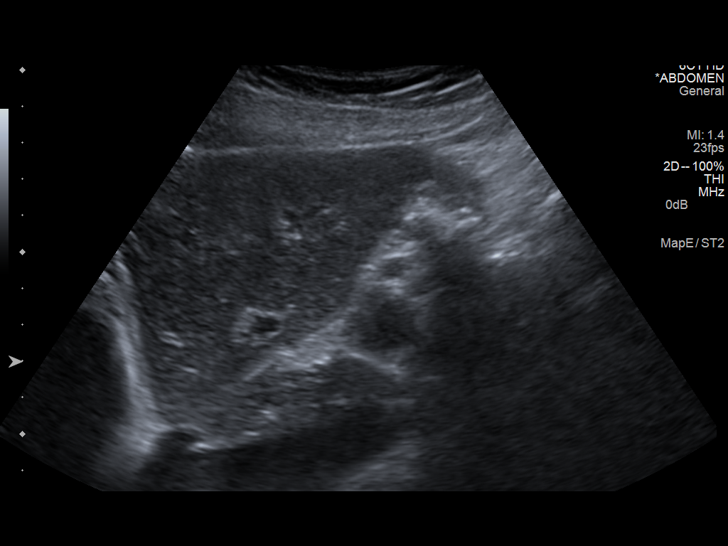
[im 15/87]
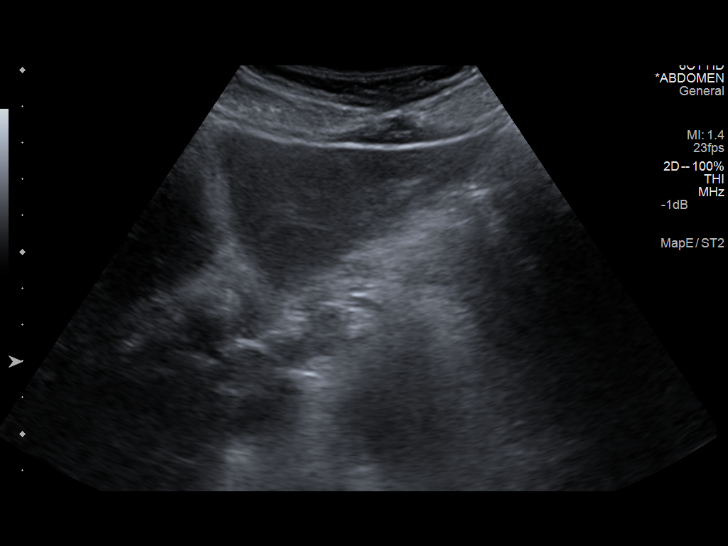
[im 22/87]
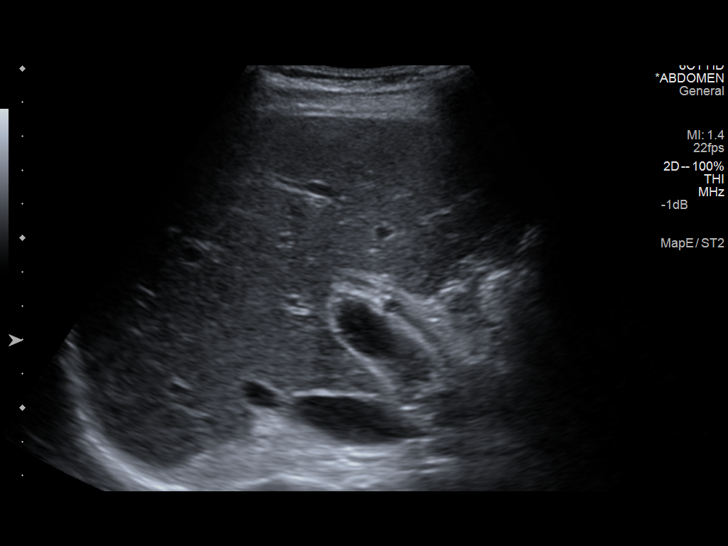
[im 29/87]
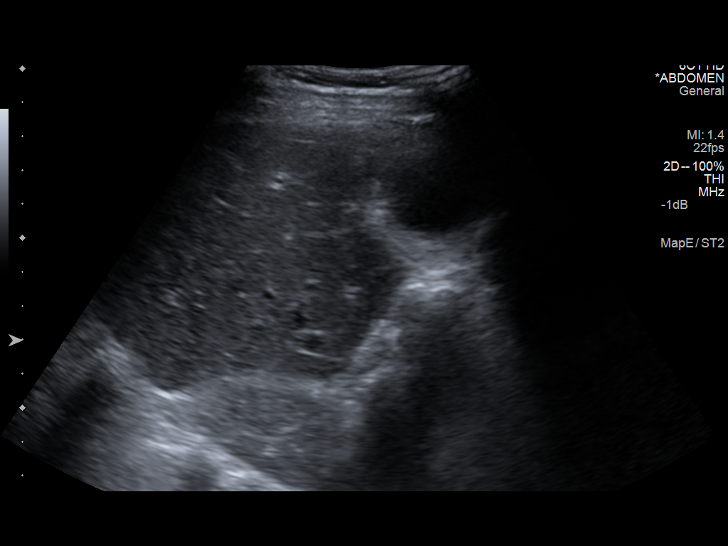
[im 33/87]
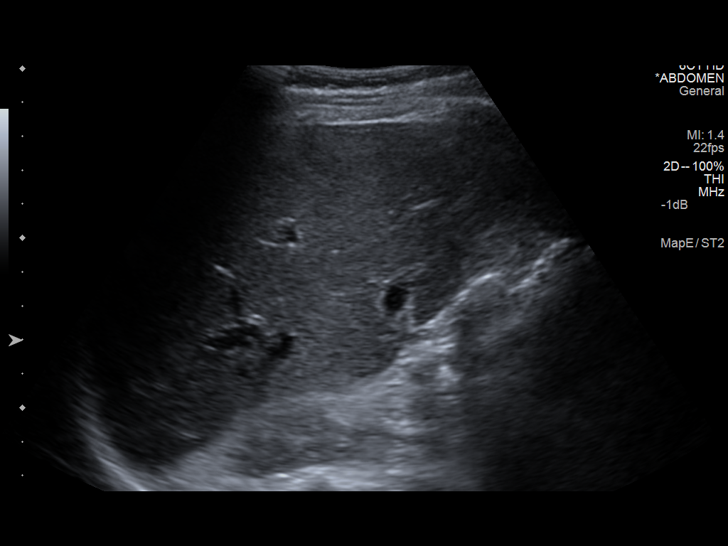
[im 40/87]
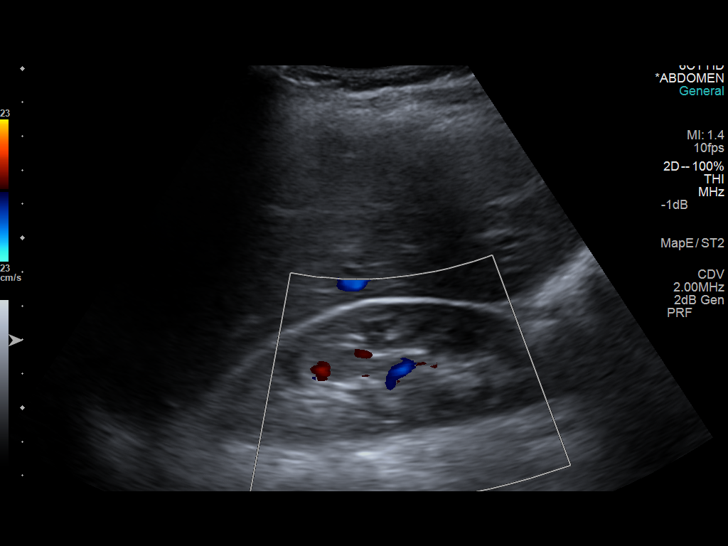
[im 47/87]
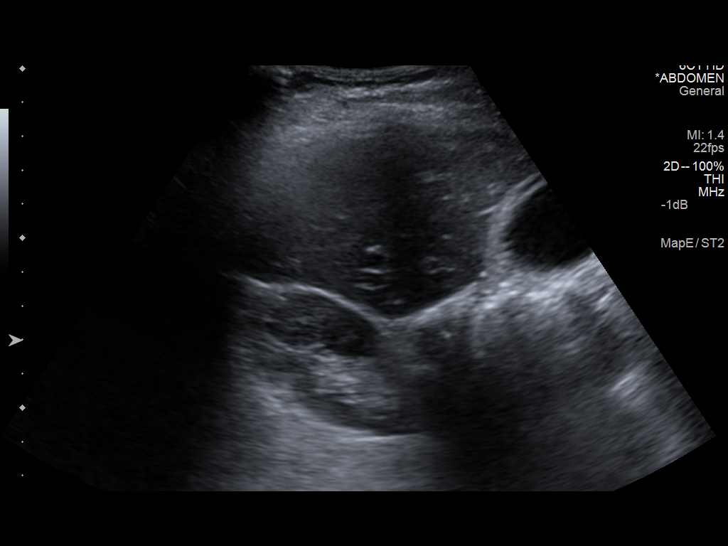
[im 54/87]
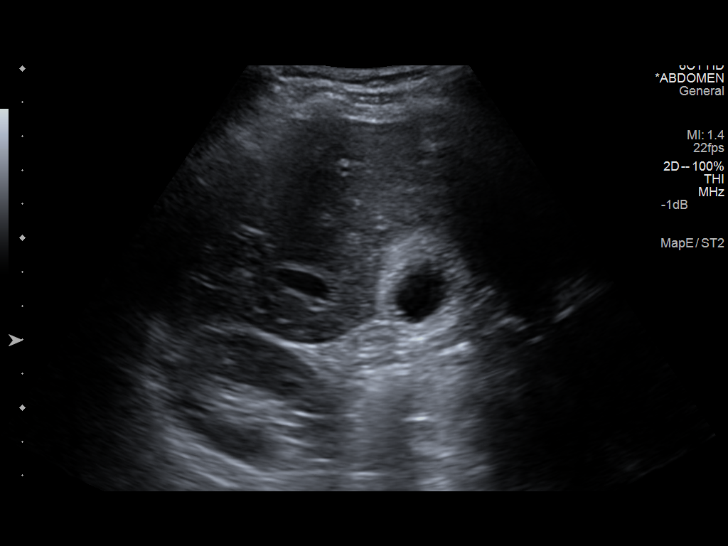
[im 58/87]
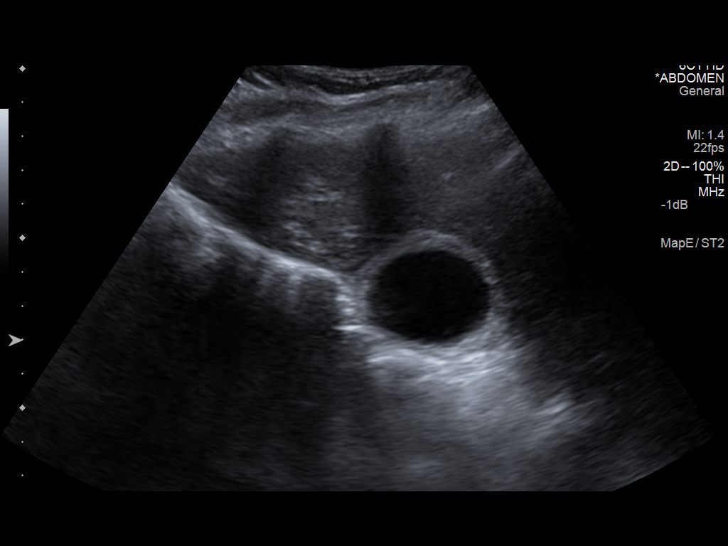
[im 65/87]
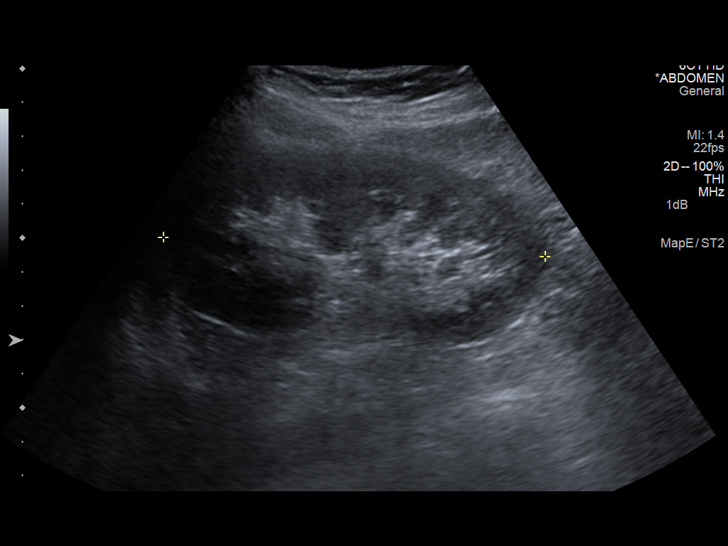
[im 72/87]
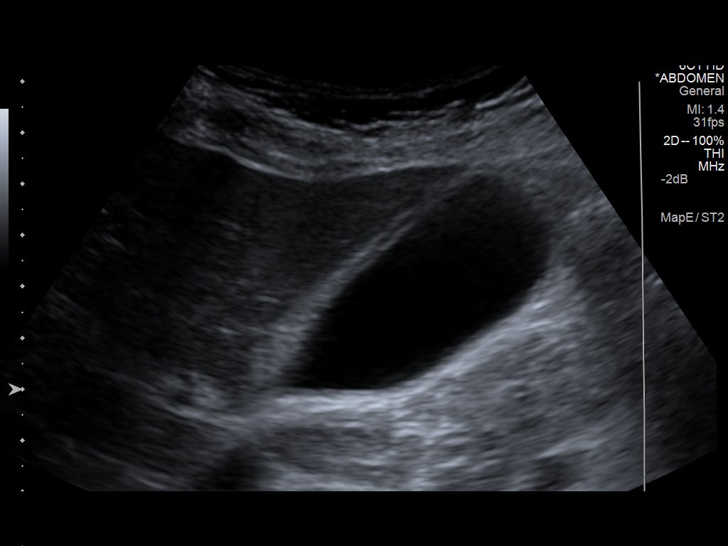
[im 79/87]
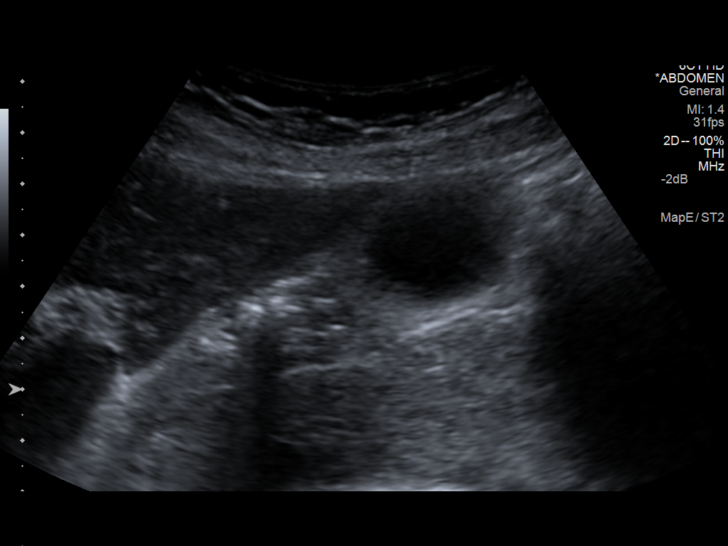
[im 87/87]
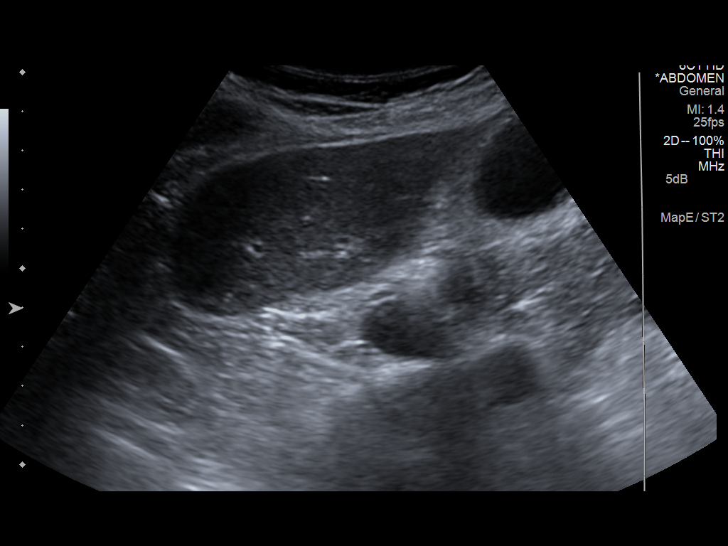

[14 of 25 positions shown; findings below may reference images not displayed]

FINDINGS: Gallbladder

No gallstones. Gallbladder wall mildly thickened at 5 mm. No
pericholecystic fluid. Sonographic Murphy's sign was not elicited.

Common bile duct

Diameter: Normal, 4 mm.

Liver

No focal lesion identified. Within normal limits in parenchymal
echogenicity.

IVC

No abnormality visualized.

Pancreas

Visualized portion unremarkable.

Spleen

Size and appearance within normal limits.

Right Kidney

Length: 11.7 cm. Echogenicity within normal limits. No mass or
hydronephrosis visualized.

Left Kidney

Length: 11.3 cm. Echogenicity within normal limits. No mass or
hydronephrosis visualized.

Abdominal aorta

No aneurysm visualized.
IMPRESSION: 1. Nonspecific gallbladder wall thickening. No evidence of
gallstones, pericholecystic fluid, or tenderness with palpation.
2. Otherwise, normal abdominal ultrasound, without explanation for
elevated liver function tests.

## 2014-05-25 ENCOUNTER — Other Ambulatory Visit: Payer: Self-pay | Admitting: Family Medicine

## 2014-05-27 NOTE — Telephone Encounter (Signed)
Is pt due for a thyroid level before filling script?

## 2014-05-29 NOTE — Telephone Encounter (Signed)
Refill for one year 

## 2014-06-06 ENCOUNTER — Encounter: Payer: Self-pay | Admitting: Family Medicine

## 2014-06-06 ENCOUNTER — Ambulatory Visit (INDEPENDENT_AMBULATORY_CARE_PROVIDER_SITE_OTHER): Payer: BC Managed Care – PPO | Admitting: Family Medicine

## 2014-06-06 VITALS — BP 82/60 | HR 65 | Temp 98.5°F | Ht 66.5 in | Wt 136.0 lb

## 2014-06-06 DIAGNOSIS — H02401 Unspecified ptosis of right eyelid: Secondary | ICD-10-CM | POA: Insufficient documentation

## 2014-06-06 DIAGNOSIS — H02409 Unspecified ptosis of unspecified eyelid: Secondary | ICD-10-CM

## 2014-06-06 NOTE — Progress Notes (Signed)
   Subjective:    Patient ID: Natalie Mullen, female    DOB: [birth year], 48 y.o.   MRN: [id number]  HPI Here for us to look at her right eye. She thinks she has ptosis of the upper lid. This does not interfere with her field of vision at all.    Review of Systems  Constitutional: Negative.   Eyes: Negative.   Neurological: Negative.        Objective:   Physical Exam  Constitutional: She appears well-developed and well-nourished.  Eyes: Conjunctivae and EOM are normal. Pupils are equal, round, and reactive to light.  She has a slight ptosis of the right upper eyelid. This comes down over the upper edge of the iris but does not cover the pupil.           Assessment & Plan:  She does have a mild ptosis. I told her this oculd be repaired surgically but I would not recommend this until it interferes with her vision. Recheck prn

## 2014-06-06 NOTE — Progress Notes (Signed)
Pre visit review using our clinic review tool, if applicable. No additional management support is needed unless otherwise documented below in the visit note. 

## 2014-06-17 ENCOUNTER — Other Ambulatory Visit: Payer: Self-pay | Admitting: Oncology

## 2014-07-01 NOTE — Telephone Encounter (Signed)
Phone call - encounter closed. 

## 2014-07-15 ENCOUNTER — Other Ambulatory Visit: Payer: Self-pay | Admitting: Oncology

## 2014-07-18 NOTE — Telephone Encounter (Signed)
Encounter was telephone call. 

## 2014-08-13 ENCOUNTER — Other Ambulatory Visit: Payer: Self-pay | Admitting: Oncology

## 2014-09-10 ENCOUNTER — Encounter: Payer: Self-pay | Admitting: Oncology

## 2014-09-10 ENCOUNTER — Ambulatory Visit (INDEPENDENT_AMBULATORY_CARE_PROVIDER_SITE_OTHER): Payer: BC Managed Care – PPO | Admitting: Oncology

## 2014-09-10 VITALS — BP 114/62 | HR 56 | Temp 97.7°F | Resp 20 | Ht 67.0 in | Wt 140.2 lb

## 2014-09-10 DIAGNOSIS — R790 Abnormal level of blood mineral: Secondary | ICD-10-CM

## 2014-09-10 DIAGNOSIS — D6861 Antiphospholipid syndrome: Secondary | ICD-10-CM

## 2014-09-10 DIAGNOSIS — I639 Cerebral infarction, unspecified: Secondary | ICD-10-CM | POA: Diagnosis not present

## 2014-09-10 DIAGNOSIS — R894 Abnormal immunological findings in specimens from other organs, systems and tissues: Secondary | ICD-10-CM

## 2014-09-10 DIAGNOSIS — Z8673 Personal history of transient ischemic attack (TIA), and cerebral infarction without residual deficits: Secondary | ICD-10-CM

## 2014-09-10 DIAGNOSIS — Z7901 Long term (current) use of anticoagulants: Secondary | ICD-10-CM | POA: Diagnosis not present

## 2014-09-10 DIAGNOSIS — R760 Raised antibody titer: Secondary | ICD-10-CM

## 2014-09-10 LAB — CBC WITH DIFFERENTIAL/PLATELET
BASOS PCT: 1 % (ref 0–1)
Basophils Absolute: 0 10*3/uL (ref 0.0–0.1)
EOS ABS: 0.3 10*3/uL (ref 0.0–0.7)
Eosinophils Relative: 6 % — ABNORMAL HIGH (ref 0–5)
HCT: 35.5 % — ABNORMAL LOW (ref 36.0–46.0)
Hemoglobin: 12.3 g/dL (ref 12.0–15.0)
Lymphocytes Relative: 29 % (ref 12–46)
Lymphs Abs: 1.4 10*3/uL (ref 0.7–4.0)
MCH: 29.4 pg (ref 26.0–34.0)
MCHC: 34.6 g/dL (ref 30.0–36.0)
MCV: 84.9 fL (ref 78.0–100.0)
Monocytes Absolute: 0.4 10*3/uL (ref 0.1–1.0)
Monocytes Relative: 8 % (ref 3–12)
NEUTROS PCT: 56 % (ref 43–77)
Neutro Abs: 2.7 10*3/uL (ref 1.7–7.7)
Platelets: 327 10*3/uL (ref 150–400)
RBC: 4.18 MIL/uL (ref 3.87–5.11)
RDW: 13.9 % (ref 11.5–15.5)
WBC: 4.9 10*3/uL (ref 4.0–10.5)

## 2014-09-10 NOTE — Patient Instructions (Signed)
To lab today Return visit 6 months no lab 

## 2014-09-10 NOTE — Progress Notes (Signed)
Patient ID: Natalie Mullen, female   DOB: November 02, 1966, 48 y.o.   MRN: [id number] Hematology and Oncology Follow Up Visit  Natalie Mullen [id number] November 02, 1966 48 y.o. 09/10/2014 1:51 PM   Principle Diagnosis: Encounter Diagnoses  Name Primary?  Marland Kitchen. Anticardiolipin antibody positive Yes  . CVA (cerebral vascular accident)   . Antiphospholipid antibody with hypercoagulable state   . Chronic anticoagulation   . History of ischemic left MCA stroke   . Lupus anticoagulant positive      Interim History:  Followup visit for this very pleasant 48 year old woman originally from [place] who presented with an acute left MCA distribution stroke in July of 2011 as the first sign of an underlying antiphospholipid antibody syndrome. She underwent thrombolytic therapy and thrombectomy and had a complete neurologic recovery. She was on full dose Coumadin anticoagulation and low-dose aspirin until December 2014 when I changed her to Xarelto plus low-dose aspirin. 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 Coumadin stopped due to the difficulty adjusting the dose in view of both her antiphospholipid antibody syndrome and the acute liver injury. Fortunately her immune hepatitis is now controlled and she is off all steroids and other immunosuppressant drugs. She has been followed by Dr. Leone PayorGessner here in town and is also seeing a liver specialist Dr. Marita SnellenPaul Hyashi [facility]. Last liver functions recorded in other system on 11/20/2013 were improving but still not back to normal with an SGOT 23 SGPT 66. Shortly after her visit with me in April of this year, she developed headaches. No new focal neurologic deficits. CT scan of the head with and without contrast done on 04/03/2014 showed some chronic changes in the area of the previous stroke but no acute changes. Her headaches have since resolved. She was able to  go [place] with her new steady female friend who accompanies her today. She had no medical issues while she was away. She reports no recurrent headaches, no change in vision, no focal weakness, no slurred speech, no distal paresthesias.   Medications: reviewed  Allergies: No Known Allergies  Review of Systems: Hematology:  No bleeding or bruising ENT ROS: No sore throat Breast ROS:  Respiratory ROS: No cough or dyspnea Cardiovascular ROS:  No chest pain or palpitations Gastrointestinal ROS:  No abdominal pain  Genito-Urinary ROS: Not questioned Musculoskeletal ROS: No muscle bone or joint pain Neurological ROS: See above Dermatological ROS: No rash or ecchymosis Remaining ROS negative:   Physical Exam: Blood pressure 114/62, pulse 56, temperature 97.7 F (36.5 C), temperature source Oral, resp. rate 20, height 5\' 7"  (1.702 m), weight 140 lb 3.2 oz (63.594 kg), last menstrual period 08/10/2014, SpO2 100.00%. Wt Readings from Last 3 Encounters:  09/10/14 140 lb 3.2 oz (63.594 kg)  06/06/14 136 lb (61.689 kg)  04/01/14 134 lb (60.782 kg)     General appearance: Healthy appearing woman HENNT: Pharynx no erythema, exudate, mass, or ulcer. No thyromegaly or thyroid nodules Lymph nodes: No cervical, supraclavicular, or axillary lymphadenopathy Breasts:  Lungs: Clear to auscultation, resonant to percussion throughout Heart: Regular rhythm, no murmur, no gallop, no rub, no click, no edema Abdomen: Soft, nontender, normal bowel sounds, no mass, no organomegaly Extremities: No edema, no calf tenderness Musculoskeletal: no joint deformities GU:  Vascular: Carotid pulses 2+, no bruits,  Neurologic: Alert, oriented, PERRLA, questionable minimal ptosis of the right eyelid, full extraocular movements, optic  discs sharp and vessels normal, no hemorrhage or exudate, cranial nerves grossly normal, motor strength 5 over 5, reflexes 1+ symmetric, upper body coordination normal,  gait normal, finger to finger, rapid alternating movements, all normal. Skin: No rash or ecchymosis  Lab Results: CBC W/Diff    Component Value Date/Time   WBC 11.4* 11/05/2013 1404   WBC 12.3* 11/02/2013 1638   RBC 4.72 11/05/2013 1404   RBC 4.47 11/02/2013 1638   HGB 14.0 11/05/2013 1404   HGB 13.3 11/02/2013 1638   HCT 41.0 11/05/2013 1404   HCT 39.0 11/02/2013 1638   PLT 329 11/05/2013 1404   PLT 394.0 11/02/2013 1638   MCV 86.9 11/05/2013 1404   MCV 87.3 11/02/2013 1638   MCH 29.7 11/05/2013 1404   MCH 28.9 09/03/2013 0850   MCHC 34.1 11/05/2013 1404   MCHC 34.1 11/02/2013 1638   RDW 14.6* 11/05/2013 1404   RDW 15.2* 11/02/2013 1638   LYMPHSABS 1.9 11/05/2013 1404   LYMPHSABS 2.8 11/02/2013 1638   MONOABS 0.5 11/05/2013 1404   MONOABS 0.9 11/02/2013 1638   EOSABS 0.0 11/05/2013 1404   EOSABS 0.0 11/02/2013 1638   BASOSABS 0.0 11/05/2013 1404   BASOSABS 0.1 11/02/2013 1638     Chemistry      Component Value Date/Time   NA 138 11/05/2013 1404   NA 134* 08/16/2013 1229   K 3.9 11/05/2013 1404   K 3.7 08/16/2013 1229   CL 100 08/16/2013 1229   CL 105 04/03/2013 0849   CO2 25 11/05/2013 1404   CO2 26 08/16/2013 1229   BUN 11.7 11/05/2013 1404   BUN 10 08/16/2013 1229   CREATININE 0.9 11/05/2013 1404   CREATININE 0.8 08/16/2013 1229      Component Value Date/Time   CALCIUM 9.9 11/05/2013 1404   CALCIUM 9.4 08/16/2013 1229   ALKPHOS 52 11/20/2013 1616   ALKPHOS 71 11/05/2013 1404   AST 23 11/20/2013 1616   AST 169* 11/05/2013 1404   ALT 66* 11/20/2013 1616   ALT 267* 11/05/2013 1404   BILITOT 1.1 11/20/2013 1616   BILITOT 0.87 11/05/2013 1404      Impression:   #1. Coagulopathy secondary to antiphospholipid antibody syndrome   #2. Status post left MCA stroke secondary to #1.   #3. Chronic anticoagulation now on Xarelto 20 mg daily and aspirin 81 mg daily..    #4. Autoimmune hepatitis  Complete response to steroids and Imuran currently off all immune  suppressants..  Other check lab today and if everything is stable see her again in 6 months.    CC: Patient Care Team: Nelwyn SalisburyStephen A Fry, MD as PCP - General Levert FeinsteinJames M Granfortuna, MD as Consulting Physician (Oncology) Iva Booparl E Gessner, MD as Consulting Physician (Gastroenterology)   Levert FeinsteinGRANFORTUNA,JAMES M, MD 10/20/20151:51 PM

## 2014-09-11 ENCOUNTER — Telehealth: Payer: Self-pay | Admitting: *Deleted

## 2014-09-11 LAB — COMPREHENSIVE METABOLIC PANEL
ALBUMIN: 3.9 g/dL (ref 3.5–5.2)
ALK PHOS: 48 U/L (ref 39–117)
ALT: 11 U/L (ref 0–35)
AST: 16 U/L (ref 0–37)
BILIRUBIN TOTAL: 0.8 mg/dL (ref 0.2–1.2)
BUN: 12 mg/dL (ref 6–23)
CO2: 24 mEq/L (ref 19–32)
Calcium: 9.5 mg/dL (ref 8.4–10.5)
Chloride: 105 mEq/L (ref 96–112)
Creat: 0.79 mg/dL (ref 0.50–1.10)
Glucose, Bld: 69 mg/dL — ABNORMAL LOW (ref 70–99)
Potassium: 4.1 mEq/L (ref 3.5–5.3)
Sodium: 138 mEq/L (ref 135–145)
TOTAL PROTEIN: 6.5 g/dL (ref 6.0–8.3)

## 2014-09-11 LAB — GAMMA GT: GGT: 15 U/L (ref 7–51)

## 2014-09-11 LAB — LACTATE DEHYDROGENASE: LDH: 118 U/L (ref 94–250)

## 2014-09-11 NOTE — Telephone Encounter (Signed)
Message copied by Hassan BucklerPALMER, GLENDA H on Wed Sep 11, 2014 10:56 AM ------      Message from: Levert FeinsteinGRANFORTUNA, JAMES M      Created: Wed Sep 11, 2014  9:00 AM       Call pt: liver function remains normal ------

## 2014-09-11 NOTE — Telephone Encounter (Signed)
Pt called/informed liver function remains normal per Dr Cyndie ChimeGranfortuna; no questions and thanked for calling.

## 2014-09-17 ENCOUNTER — Other Ambulatory Visit: Payer: Self-pay | Admitting: Oncology

## 2014-09-23 ENCOUNTER — Encounter: Payer: Self-pay | Admitting: Oncology

## 2014-10-19 ENCOUNTER — Other Ambulatory Visit: Payer: Self-pay | Admitting: Oncology

## 2014-11-24 ENCOUNTER — Other Ambulatory Visit: Payer: Self-pay | Admitting: Oncology

## 2014-12-19 ENCOUNTER — Telehealth: Payer: Self-pay | Admitting: *Deleted

## 2014-12-19 NOTE — Telephone Encounter (Signed)
[facility] - pt is schedule for a colonoscopy on Jan 16, 2015 and is on Xarelto. They need written clearance on when to stop and re-start Xarelto; fax# [phone]. [facility]'s telephone [phone]. Thanks

## 2014-12-26 ENCOUNTER — Encounter: Payer: Self-pay | Admitting: Oncology

## 2014-12-26 NOTE — Telephone Encounter (Signed)
Letter sent. Copy to be sent to pt as well drG 12/26/14

## 2015-01-30 HISTORY — PX: COLONOSCOPY: SHX174

## 2015-03-04 ENCOUNTER — Ambulatory Visit (INDEPENDENT_AMBULATORY_CARE_PROVIDER_SITE_OTHER): Payer: BLUE CROSS/BLUE SHIELD | Admitting: Oncology

## 2015-03-04 ENCOUNTER — Encounter: Payer: Self-pay | Admitting: Oncology

## 2015-03-04 VITALS — BP 109/55 | HR 52 | Temp 97.8°F | Ht 67.0 in | Wt 139.3 lb

## 2015-03-04 DIAGNOSIS — L989 Disorder of the skin and subcutaneous tissue, unspecified: Secondary | ICD-10-CM

## 2015-03-04 DIAGNOSIS — D6861 Antiphospholipid syndrome: Secondary | ICD-10-CM | POA: Diagnosis not present

## 2015-03-04 DIAGNOSIS — R760 Raised antibody titer: Secondary | ICD-10-CM

## 2015-03-04 DIAGNOSIS — H02401 Unspecified ptosis of right eyelid: Secondary | ICD-10-CM

## 2015-03-04 DIAGNOSIS — Z7960 Long term (current) use of unspecified immunomodulators and immunosuppressants: Secondary | ICD-10-CM

## 2015-03-04 DIAGNOSIS — Z8673 Personal history of transient ischemic attack (TIA), and cerebral infarction without residual deficits: Secondary | ICD-10-CM

## 2015-03-04 DIAGNOSIS — K754 Autoimmune hepatitis: Secondary | ICD-10-CM

## 2015-03-04 DIAGNOSIS — Z79899 Other long term (current) drug therapy: Secondary | ICD-10-CM

## 2015-03-04 DIAGNOSIS — Z7901 Long term (current) use of anticoagulants: Secondary | ICD-10-CM | POA: Diagnosis not present

## 2015-03-04 DIAGNOSIS — I639 Cerebral infarction, unspecified: Secondary | ICD-10-CM

## 2015-03-04 LAB — COMPREHENSIVE METABOLIC PANEL
AST: 14 U/L (ref 0–37)
Albumin: 3.7 g/dL (ref 3.5–5.2)
Alkaline Phosphatase: 48 U/L (ref 39–117)
BUN: 13 mg/dL (ref 6–23)
CALCIUM: 8.9 mg/dL (ref 8.4–10.5)
CHLORIDE: 106 meq/L (ref 96–112)
CO2: 22 mEq/L (ref 19–32)
Creat: 0.91 mg/dL (ref 0.50–1.10)
Glucose, Bld: 70 mg/dL (ref 70–99)
Potassium: 4.2 mEq/L (ref 3.5–5.3)
Sodium: 139 mEq/L (ref 135–145)
TOTAL PROTEIN: 6.5 g/dL (ref 6.0–8.3)
Total Bilirubin: 0.7 mg/dL (ref 0.2–1.2)

## 2015-03-04 LAB — CBC WITH DIFFERENTIAL/PLATELET
Basophils Absolute: 0.1 10*3/uL (ref 0.0–0.1)
Basophils Relative: 1 % (ref 0–1)
EOS PCT: 6 % — AB (ref 0–5)
Eosinophils Absolute: 0.3 10*3/uL (ref 0.0–0.7)
HCT: 34.8 % — ABNORMAL LOW (ref 36.0–46.0)
HEMOGLOBIN: 12.1 g/dL (ref 12.0–15.0)
LYMPHS ABS: 1.5 10*3/uL (ref 0.7–4.0)
LYMPHS PCT: 30 % (ref 12–46)
MCH: 29.4 pg (ref 26.0–34.0)
MCHC: 34.8 g/dL (ref 30.0–36.0)
MCV: 84.5 fL (ref 78.0–100.0)
MPV: 9.2 fL (ref 8.6–12.4)
Monocytes Absolute: 0.4 10*3/uL (ref 0.1–1.0)
Monocytes Relative: 7 % (ref 3–12)
NEUTROS ABS: 2.8 10*3/uL (ref 1.7–7.7)
Neutrophils Relative %: 56 % (ref 43–77)
Platelets: 281 10*3/uL (ref 150–400)
RBC: 4.12 MIL/uL (ref 3.87–5.11)
RDW: 14.4 % (ref 11.5–15.5)
WBC: 5 10*3/uL (ref 4.0–10.5)

## 2015-03-04 LAB — IRON AND TIBC
%SAT: 13 % — AB (ref 20–55)
IRON: 50 ug/dL (ref 42–145)
TIBC: 392 ug/dL (ref 250–470)
UIBC: 342 ug/dL (ref 125–400)

## 2015-03-04 LAB — FERRITIN: Ferritin: 9 ng/mL — ABNORMAL LOW (ref 10–291)

## 2015-03-04 NOTE — Patient Instructions (Signed)
To lab today Return visit 4-6 months 

## 2015-03-04 NOTE — Progress Notes (Signed)
Patient ID: Natalie Mullen, female   DOB: 1966/11/06, 49 y.o.   MRN: [id number] Hematology and Oncology Follow Up Visit  Natalie Mullen [id number] Apr 19, 1966 49 y.o. 03/04/2015 7:17 PM   Principle Diagnosis: Encounter Diagnoses  Name Primary?  Marland Kitchen Anticardiolipin antibody positive Yes  . CVA (cerebral vascular accident)   . Antiphospholipid antibody with hypercoagulable state   . Autoimmune hepatitis   . Long-term use of immunosuppressant medication azathioprine   . Chronic anticoagulation   . History of ischemic left MCA stroke   . Lupus anticoagulant positive   . Ptosis of right eyelid    clinical summary: 49 year old woman originally from [place] who presented with an acute left MCA distribution stroke in July of 2011 as the first sign of an underlying antiphospholipid antibody syndrome. She underwent thrombolytic therapy and thrombectomy and had a complete neurologic recovery. She was on full dose Coumadin anticoagulation and low-dose aspirin until December 2014 when I changed her to Xarelto plus low-dose aspirin. 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 Coumadin stopped due to the difficulty adjusting the dose in view of both her antiphospholipid antibody syndrome and the acute liver injury. Fortunately her immune hepatitis is now controlled and she is off all steroids and other immunosuppressant drugs. She has been followed by Dr. Leone Payor here in town and is also seeing a liver specialist Dr. Marita Snellen in [facility]. . Shortly after her visit with me in April of 2015, she developed headaches. No new focal neurologic deficits. CT scan of the head with and without contrast done on 04/03/2014 showed some chronic changes in the area of the previous stroke but no acute changes. Headaches have long since resolved.  Interim History:   She denies any neurologic symptoms and  specifically denies headache, change in vision, slurred speech, dysarthria, focal weakness, or paresthesias. She has noted increasing bronzing of her skin. I noticed this as well at time of her visit here in October 2015 but I wasn't sure if it was just a natural tan and didn't mention it to her. She has noted changes of her nails which are becoming very brittle. Her mother told her this was a sign of poor health. She attributes this to the azathioprine. Although she is probably right, I looked up side effects both of Xarelto and azathioprine and hyperpigmentation of the skin or changes in the nails are not reported for these medications.  Medications:  Current outpatient prescriptions:  .  aspirin 81 MG tablet, Take 81 mg by mouth every evening. , Disp: , Rfl:  .  azaTHIOprine (IMURAN) 50 MG tablet, Take 75 mg by mouth daily. HOLD!!!!!!, Disp: , Rfl:  .  calcium carbonate (TUMS - DOSED IN MG ELEMENTAL CALCIUM) 500 MG chewable tablet, Chew 3 tablets by mouth 2 (two) times daily., Disp: , Rfl:  .  cholecalciferol (VITAMIN D) 1000 UNITS tablet, Take 1,000 Units by mouth daily., Disp: , Rfl:  .  levothyroxine (SYNTHROID, LEVOTHROID) 75 MCG tablet, TAKE 1 TABLET BY MOUTH DAILY, Disp: 90 tablet, Rfl: 3 .  XARELTO 20 MG TABS tablet, TAKE 1 TABLET BY MOUTH EVERY DAY., Disp: 30 tablet, Rfl: 0   Allergies: No Known Allergies  Review of Systems: See HPI Remaining ROS negative:   Physical Exam: Blood pressure 109/55, pulse 52, temperature 97.8 F (36.6 C), temperature source Oral, height  (1.702 m), weight 139 lb 4.8 oz (63.186 kg), SpO2  100 %. Wt Readings from Last 3 Encounters:  03/04/15 139 lb 4.8 oz (63.186 kg)  09/10/14 140 lb 3.2 oz (63.594 kg)  06/06/14 136 lb (61.689 kg)     General appearance: Well-nourished Hispanic woman. Prominent bronzing of the skin. HENNT: Pharynx no erythema, exudate, mass, or ulcer. No thyromegaly or thyroid nodules Lymph nodes: No cervical,  supraclavicular, or axillary lymphadenopathy Breasts: Lungs: Clear to auscultation, resonant to percussion throughout Heart: Regular rhythm, no murmur, no gallop, no rub, no click, no edema Abdomen: Soft, nontender, normal bowel sounds, no mass, no organomegaly Extremities: No edema, no calf tenderness Musculoskeletal: no joint deformities GU:  Vascular: Carotid pulses 2+, no bruits,  Neurologic: Alert, oriented, PERRLA, optic discs sharp and vessels normal, no hemorrhage or exudate, cranial nerves grossly normal, motor strength 5 over 5, reflexes 1+ symmetric, upper body coordination normal, gait normal, Skin: No rash or ecchymosis  Lab Results: CBC W/Diff    Component Value Date/Time   WBC 5.0 03/04/2015 1111   WBC 11.4* 11/05/2013 1404   RBC 4.12 03/04/2015 1111   RBC 4.72 11/05/2013 1404   HGB 12.1 03/04/2015 1111   HGB 14.0 11/05/2013 1404   HCT 34.8* 03/04/2015 1111   HCT 41.0 11/05/2013 1404   PLT 281 03/04/2015 1111   PLT 329 11/05/2013 1404   MCV 84.5 03/04/2015 1111   MCV 86.9 11/05/2013 1404   MCH 29.4 03/04/2015 1111   MCH 29.7 11/05/2013 1404   MCHC 34.8 03/04/2015 1111   MCHC 34.1 11/05/2013 1404   RDW 14.4 03/04/2015 1111   RDW 14.6* 11/05/2013 1404   LYMPHSABS 1.5 03/04/2015 1111   LYMPHSABS 1.9 11/05/2013 1404   MONOABS 0.4 03/04/2015 1111   MONOABS 0.5 11/05/2013 1404   EOSABS 0.3 03/04/2015 1111   EOSABS 0.0 11/05/2013 1404   BASOSABS 0.1 03/04/2015 1111   BASOSABS 0.0 11/05/2013 1404     Chemistry      Component Value Date/Time   NA 139 03/04/2015 1111   NA 138 11/05/2013 1404   K 4.2 03/04/2015 1111   K 3.9 11/05/2013 1404   CL 106 03/04/2015 1111   CL 105 04/03/2013 0849   CO2 22 03/04/2015 1111   CO2 25 11/05/2013 1404   BUN 13 03/04/2015 1111   BUN 11.7 11/05/2013 1404   CREATININE 0.91 03/04/2015 1111   CREATININE 0.9 11/05/2013 1404   CREATININE 0.8 08/16/2013 1229      Component Value Date/Time   CALCIUM 8.9 03/04/2015 1111    CALCIUM 9.9 11/05/2013 1404   ALKPHOS 48 03/04/2015 1111   ALKPHOS 71 11/05/2013 1404   AST 14 03/04/2015 1111   AST 169* 11/05/2013 1404   ALT <8 03/04/2015 1111   ALT 267* 11/05/2013 1404   BILITOT 0.7 03/04/2015 1111   BILITOT 0.87 11/05/2013 1404       Radiological Studies: No results found.  Impression:   #1. Coagulopathy secondary to antiphospholipid antibody syndrome   #2. Status post left MCA stroke secondary to #1.   #3. Chronic anticoagulation now on Xarelto 20 mg daily and aspirin 81 mg daily..   #4. Autoimmune hepatitis  Complete response to steroids and Imuran currently on azathioprine alone. Liver functions have normalized including values done today 03/04/2015. She would like to get off the azathioprine. I will leave this to the discretion of her liver specialist.  #5. Unusual bronzing of the skin. 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 Serum iron 50, TIBC 392,  percent saturation 13, ferritin 9. If anything, she is iron deficient. No sign that she has hemochromatosis. I'm also checking a random cortisol level and a ceruloplasmin which are pending.   CC: Patient Care Team: Nelwyn Salisbury, MD as PCP - General Levert Feinstein, MD as Consulting Physician (Oncology) Iva Boop, MD as Consulting Physician (Gastroenterology)   Levert Feinstein, MD 4/12/20167:17 PM

## 2015-03-05 LAB — CORTISOL: Cortisol, Plasma: 6.8 ug/dL

## 2015-03-06 LAB — CERULOPLASMIN: CERULOPLASMIN: 24 mg/dL (ref 18–53)

## 2015-03-10 ENCOUNTER — Telehealth: Payer: Self-pay | Admitting: *Deleted

## 2015-03-10 NOTE — Telephone Encounter (Signed)
Pt called / informed her cortisol/steroid level are normal and she's iron deficient and start taking OTC ferrous sulfate 325mg  TID per Dr Cyndie ChimeGranfortuna. Informed her if she needs a rx for the ferrous sulfate, to call me back. Stated she would.

## 2015-03-10 NOTE — Telephone Encounter (Signed)
-----   Message from Levert FeinsteinJames M Granfortuna, MD sent at 03/05/2015  8:59 AM EDT ----- Call pt:  Cortisol/steroid level normal.  She is iron deficient: can start OTC ferrous sulfate 325 mg TID

## 2015-04-20 ENCOUNTER — Encounter: Payer: Self-pay | Admitting: Oncology

## 2015-08-13 ENCOUNTER — Encounter: Payer: Self-pay | Admitting: Family Medicine

## 2015-08-13 ENCOUNTER — Ambulatory Visit (INDEPENDENT_AMBULATORY_CARE_PROVIDER_SITE_OTHER): Payer: BLUE CROSS/BLUE SHIELD | Admitting: Family Medicine

## 2015-08-13 VITALS — BP 105/67 | HR 52 | Temp 97.7°F | Ht 67.0 in | Wt 136.0 lb

## 2015-08-13 DIAGNOSIS — L819 Disorder of pigmentation, unspecified: Secondary | ICD-10-CM | POA: Diagnosis not present

## 2015-08-13 DIAGNOSIS — B351 Tinea unguium: Secondary | ICD-10-CM

## 2015-08-13 NOTE — Progress Notes (Signed)
   Subjective:    Patient ID: Natalie Mullen, female    DOB: [birth year], 50 y.o.   MRN: [id number]  HPI Here with some questions. She has had a fungal infection in the toenails for several years and she wants to treat it. Also she has developed some splotchy discoloration on her skin, particularly the face. She is worried this could be a sign that something is wrong inside her body, and she wants to see an Endocrinologist about it. She does have a number of autoimmune disorders, including thyroid disease, autoimmune hepatitis, and positive lupus anticoagulant.   Review of Systems  Constitutional: Negative.   Respiratory: Negative.   Cardiovascular: Negative.   Gastrointestinal: Negative.   Skin: Positive for color change.       Objective:   Physical Exam  Constitutional: She appears well-developed and well-nourished.  Cardiovascular: Normal rate, regular rhythm, normal heart sounds and intact distal pulses.   Pulmonary/Chest: Effort normal and breath sounds normal.  Skin:  All ten toenails have fungal involvement. Her facial skin has areas of hyperpigmentation          Assessment & Plan:  I discussed treatment options for the toenail fungus and told her that the most effective treatment is wit an oral medication like Terbinafine. However I am hesitant to give her this considering her hx of liver disease. I asked her to run this by her GI doctor, Dr. Cipriano Bunker first. As for the skin hyperpigmentation, we will refer her to Endocrine and to Dermatology.

## 2015-08-13 NOTE — Progress Notes (Signed)
Pre visit review using our clinic review tool, if applicable. No additional management support is needed unless otherwise documented below in the visit note. 

## 2015-08-14 MED ORDER — TERBINAFINE HCL 250 MG PO TABS: 250.0000 mg | ORAL_TABLET | Freq: Every day | ORAL | Status: DC

## 2015-08-14 NOTE — Telephone Encounter (Signed)
Call in Terbinafine 250 mg a day, #90 with no rf

## 2015-08-21 ENCOUNTER — Other Ambulatory Visit: Payer: Self-pay | Admitting: Oncology

## 2015-08-27 ENCOUNTER — Encounter: Payer: Self-pay | Admitting: Endocrinology

## 2015-08-27 ENCOUNTER — Ambulatory Visit (INDEPENDENT_AMBULATORY_CARE_PROVIDER_SITE_OTHER): Payer: BLUE CROSS/BLUE SHIELD | Admitting: Endocrinology

## 2015-08-27 VITALS — BP 104/62 | HR 50 | Temp 97.5°F | Ht 67.0 in | Wt 135.0 lb

## 2015-08-27 DIAGNOSIS — E039 Hypothyroidism, unspecified: Secondary | ICD-10-CM

## 2015-08-27 DIAGNOSIS — L819 Disorder of pigmentation, unspecified: Secondary | ICD-10-CM | POA: Diagnosis not present

## 2015-08-27 LAB — TSH: TSH: 23.32 u[IU]/mL — AB (ref 0.35–4.50)

## 2015-08-27 LAB — CORTISOL
Cortisol, Plasma: 6.3 ug/dL
Cortisol, Plasma: 6.6 ug/dL

## 2015-08-27 LAB — T4, FREE: FREE T4: 0.81 ng/dL (ref 0.60–1.60)

## 2015-08-27 MED ORDER — COSYNTROPIN 0.25 MG IJ SOLR
0.2500 mg | Freq: Once | INTRAMUSCULAR | Status: AC
  Administered 2015-08-27: 0.25 mg via INTRAMUSCULAR

## 2015-08-27 NOTE — Patient Instructions (Signed)
blood tests are requested for you today.  We'll let you know about the results.   This is outside my field of study, but another option is a skin biopsy. I would be happy to see you back here as necessary.

## 2015-08-27 NOTE — Progress Notes (Signed)
Subjective:    Patient ID: Natalie Mullen, female    DOB: 09-27-1966, 49 y.o.   MRN: [id number]  HPI Pt states 20 years of progressive severe darkening of the skin throughout the body, and assoc urinary frequency.   Past Medical History  Diagnosis Date  . Stroke [facility]) 05/2010    left middle cerebral artery stroke 05-2010 had intravasscular TPA with right hemiparesis and aphasia  . Anticardiolipin antibody positive     sees Dr. Cyndie Chime  . Hypothyroid   . Dysphagia as late effect of stroke 03/27/2012    Possible component of reflux esophagitis  . Antiphospholipid antibody with hypercoagulable state (HCC) 03/27/2012    Initial presentation 05/2010 with acute left MCA stroke  . Chronic anticoagulation 11/05/2013  . Autoimmune hepatitis (HCC)     sees Dr. Daun Peacock     Past Surgical History  Procedure Laterality Date  . Tonsillectomy    . Lasik    . Appendectomy    . Cesarean section      3    Social History   Social History  . Marital Status: Married    Spouse Name: N/A  . Number of Children: N/A  . Years of Education: N/A   Occupational History  . Not on file.   Social History Main Topics  . Smoking status: Former Smoker    Types: Cigarettes    Quit date: 06/17/2010  . Smokeless tobacco: Never Used  . Alcohol Use: 0.0 oz/week    0 Standard drinks or equivalent per week     Comment: socially  . Drug Use: No  . Sexual Activity: Not on file   Other Topics Concern  . Not on file   Social History Narrative   Married   Originally from [place]   Avid tennis player    Current Outpatient Prescriptions on File Prior to Visit  Medication Sig Dispense Refill  . aspirin 81 MG tablet Take 81 mg by mouth every evening.     Marland Kitchen azaTHIOprine (IMURAN) 50 MG tablet Take 25 mg by mouth daily.     Marland Kitchen levothyroxine (SYNTHROID, LEVOTHROID) 75 MCG tablet Take 75 mcg by mouth at bedtime. Taking 1/2 tablet    . terbinafine (LAMISIL) 250 MG tablet Take 1 tablet (250  mg total) by mouth daily. 90 tablet 0  . XARELTO 20 MG TABS tablet TAKE 1 TABLET BY MOUTH EVERY DAY 30 tablet 0   No current facility-administered medications on file prior to visit.    No Known Allergies  Family History  Problem Relation Age of Onset  . Arthritis    . Stroke    . Heart disease Father     BP 104/62 mmHg  Pulse 50  Temp(Src) 97.5 F (36.4 C) (Oral)  Ht  (1.702 m)  Wt 135 lb (61.236 kg)  BMI 21.14 kg/m2  SpO2 98%    Review of Systems denies loss of smell, syncope, depression, headache, visual loss, galactorrhea, weight change, easy bruising, change in facial appearance, rhinorrhea, and n/v.  She has chronically irreg menses.      Objective:   Physical Exam VS: see vs page. GEN: no distress HEAD: head: no deformity eyes: no periorbital swelling, no proptosis external nose and ears are normal mouth: no lesion seen NECK: supple, thyroid is not enlarged CHEST WALL: no deformity LUNGS:  Clear to auscultation.  CV: reg rate and rhythm, no murmur ABD: abdomen is soft, nontender.  no hepatosplenomegaly.  not distended.  no  hernia MUSCULOSKELETAL: muscle bulk and strength are grossly normal.  no obvious joint swelling.  gait is normal and steady EXTEMITIES: no deformity.  no ulcer on the feet.  feet are of normal color and temp.  no edema PULSES: dorsalis pedis intact bilat.  no carotid bruit NEURO:  cn 2-12 grossly intact.   readily moves all 4's.  sensation is intact to touch on the feet SKIN:  Normal texture and temperature.  No rash or suspicious lesion is visible.  Diffuse hyperpigmentation is noted.  NODES:  None palpable at the neck.   PSYCH: alert, well-oriented.  Does not appear anxious nor depressed.     Lab Results  Component Value Date   TSH 23.32* 08/27/2015   acth stimulation test is done: baseline cortisol level=6 then cosyntropin 250 mcg is given IM 45 minutes later, cortisol level=6 (abnormal response).    I have reviewed  outside records, and summarized:  Pt was seen by hematol, where Fe was slightly low.  ceruloplasmin was normal.  She was then ref here.       Assessment & Plan:  Chronic primary hypothyroidism: she needs increased rx. Hyperpigmentation, new to me, uncertain etiology.  Check ACTH and MSH.   Urinary frequency, uncertain etiology.  We can eval further if we find a pituitary abnormality.    Patient is advised the following: Patient Instructions  blood tests are requested for you today.  We'll let you know about the results.   This is outside my field of study, but another option is a skin biopsy. I would be happy to see you back here as necessary.

## 2015-08-29 ENCOUNTER — Encounter: Payer: Self-pay | Admitting: Endocrinology

## 2015-08-29 LAB — ACTH: C206 ACTH: 930 pg/mL — ABNORMAL HIGH (ref 6–50)

## 2015-08-31 ENCOUNTER — Inpatient Hospital Stay (HOSPITAL_COMMUNITY)
Admission: EM | Admit: 2015-08-31 | Discharge: 2015-09-01 | DRG: 315 | Disposition: A | Discharge status: Home / Self Care | Payer: BLUE CROSS/BLUE SHIELD | Attending: Internal Medicine | Admitting: Internal Medicine

## 2015-08-31 ENCOUNTER — Emergency Department (HOSPITAL_COMMUNITY): Payer: BLUE CROSS/BLUE SHIELD

## 2015-08-31 ENCOUNTER — Telehealth: Payer: Self-pay | Admitting: Family Medicine

## 2015-08-31 ENCOUNTER — Encounter (HOSPITAL_COMMUNITY): Payer: Self-pay | Admitting: *Deleted

## 2015-08-31 DIAGNOSIS — E2740 Unspecified adrenocortical insufficiency: Secondary | ICD-10-CM | POA: Diagnosis present

## 2015-08-31 DIAGNOSIS — S300XXA Contusion of lower back and pelvis, initial encounter: Secondary | ICD-10-CM | POA: Diagnosis present

## 2015-08-31 DIAGNOSIS — Y92009 Unspecified place in unspecified non-institutional (private) residence as the place of occurrence of the external cause: Secondary | ICD-10-CM | POA: Diagnosis not present

## 2015-08-31 DIAGNOSIS — R55 Syncope and collapse: Secondary | ICD-10-CM | POA: Diagnosis not present

## 2015-08-31 DIAGNOSIS — I959 Hypotension, unspecified: Secondary | ICD-10-CM | POA: Diagnosis not present

## 2015-08-31 DIAGNOSIS — K754 Autoimmune hepatitis: Secondary | ICD-10-CM | POA: Diagnosis present

## 2015-08-31 DIAGNOSIS — A419 Sepsis, unspecified organism: Secondary | ICD-10-CM | POA: Diagnosis not present

## 2015-08-31 DIAGNOSIS — E039 Hypothyroidism, unspecified: Secondary | ICD-10-CM | POA: Diagnosis present

## 2015-08-31 DIAGNOSIS — E876 Hypokalemia: Secondary | ICD-10-CM | POA: Diagnosis present

## 2015-08-31 DIAGNOSIS — T148XXA Other injury of unspecified body region, initial encounter: Secondary | ICD-10-CM

## 2015-08-31 DIAGNOSIS — D6861 Antiphospholipid syndrome: Secondary | ICD-10-CM | POA: Diagnosis present

## 2015-08-31 DIAGNOSIS — N390 Urinary tract infection, site not specified: Secondary | ICD-10-CM | POA: Diagnosis not present

## 2015-08-31 DIAGNOSIS — R1310 Dysphagia, unspecified: Secondary | ICD-10-CM | POA: Diagnosis present

## 2015-08-31 DIAGNOSIS — Z7901 Long term (current) use of anticoagulants: Secondary | ICD-10-CM | POA: Diagnosis not present

## 2015-08-31 DIAGNOSIS — D62 Acute posthemorrhagic anemia: Secondary | ICD-10-CM | POA: Diagnosis present

## 2015-08-31 DIAGNOSIS — I950 Idiopathic hypotension: Secondary | ICD-10-CM

## 2015-08-31 DIAGNOSIS — Z87891 Personal history of nicotine dependence: Secondary | ICD-10-CM

## 2015-08-31 DIAGNOSIS — T148 Other injury of unspecified body region: Secondary | ICD-10-CM | POA: Diagnosis not present

## 2015-08-31 DIAGNOSIS — I69391 Dysphagia following cerebral infarction: Secondary | ICD-10-CM

## 2015-08-31 DIAGNOSIS — W109XXA Fall (on) (from) unspecified stairs and steps, initial encounter: Secondary | ICD-10-CM | POA: Diagnosis present

## 2015-08-31 LAB — I-STAT CG4 LACTIC ACID, ED
LACTIC ACID, VENOUS: 1.76 mmol/L (ref 0.5–2.0)
Lactic Acid, Venous: 0.98 mmol/L (ref 0.5–2.0)

## 2015-08-31 LAB — BASIC METABOLIC PANEL
Anion gap: 10 (ref 5–15)
BUN: 10 mg/dL (ref 6–20)
CALCIUM: 9 mg/dL (ref 8.9–10.3)
CHLORIDE: 104 mmol/L (ref 101–111)
CO2: 25 mmol/L (ref 22–32)
Creatinine, Ser: 0.86 mg/dL (ref 0.44–1.00)
GFR calc non Af Amer: >60 mL/min (ref >60)
Glucose, Bld: 111 mg/dL — ABNORMAL HIGH (ref 65–99)
Potassium: 3.4 mmol/L — ABNORMAL LOW (ref 3.5–5.1)
SODIUM: 139 mmol/L (ref 135–145)

## 2015-08-31 LAB — CBC
HCT: 35.7 % — ABNORMAL LOW (ref 36.0–46.0)
Hemoglobin: 12.4 g/dL (ref 12.0–15.0)
MCH: 31.8 pg (ref 26.0–34.0)
MCHC: 34.7 g/dL (ref 30.0–36.0)
MCV: 91.5 fL (ref 78.0–100.0)
PLATELETS: 259 10*3/uL (ref 150–400)
RBC: 3.9 MIL/uL (ref 3.87–5.11)
RDW: 12.8 % (ref 11.5–15.5)
WBC: 13 10*3/uL — AB (ref 4.0–10.5)

## 2015-08-31 LAB — URINE MICROSCOPIC-ADD ON

## 2015-08-31 LAB — URINALYSIS, ROUTINE W REFLEX MICROSCOPIC
Bilirubin Urine: NEGATIVE
GLUCOSE, UA: NEGATIVE mg/dL
HGB URINE DIPSTICK: NEGATIVE
Ketones, ur: 15 mg/dL — AB
LEUKOCYTES UA: NEGATIVE
Nitrite: POSITIVE — AB
PH: 6 (ref 5.0–8.0)
Protein, ur: NEGATIVE mg/dL
Urobilinogen, UA: 0.2 mg/dL (ref 0.0–1.0)

## 2015-08-31 LAB — I-STAT TROPONIN, ED: Troponin i, poc: 0 ng/mL (ref 0.00–0.08)

## 2015-08-31 LAB — TROPONIN I

## 2015-08-31 MED ORDER — SODIUM CHLORIDE 0.9 % IV BOLUS (SEPSIS)
1000.0000 mL | Freq: Once | INTRAVENOUS | Status: AC
  Administered 2015-08-31: 1000 mL via INTRAVENOUS

## 2015-08-31 MED ORDER — CEFTRIAXONE SODIUM 1 G IJ SOLR
1.0000 g | Freq: Once | INTRAMUSCULAR | Status: AC
  Administered 2015-08-31: 1 g via INTRAVENOUS
  Filled 2015-08-31: qty 10

## 2015-08-31 NOTE — ED Notes (Signed)
Pt has large bruise to left buttock- covering almost entire buttock from fall earlier today. Area is hard. Placed ice on pt's buttock.

## 2015-08-31 NOTE — ED Notes (Signed)
The pt is c/o a fall  This am where she fell onto her lt hip and buttocks.  She was feeling faint.  She has bruising and swelling to her buttocks with pain.   Especially with movement

## 2015-08-31 NOTE — ED Notes (Signed)
Feeling faint while in triage  bp low

## 2015-08-31 NOTE — H&P (Signed)
[facility] History and Physical  Christie Viscomi NGE:[id number] DOB: 26-Jun-1966 DOA: 08/31/2015   PCP: Nelwyn Salisbury, MD  Specialists: Dr. Cyndie Chime  Chief Complaint: Near syncope  HPI: Natalie Mullen is a 49 y.o. 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  Her significant other witnessed it, but they both thought it was accidental at the time.  By noon, the patient was quite light headed and near syncopal with aura (vision disturbance).  No chest pain or shortness of breath.  This prompted presentation to the ED for further evaluation.  She has been found to have hypotension (systolic BP normally in the low 100s).  She has a large hematoma on her left buttock and U/A appears consistent with UTI.  Patient admits to recent urinary urgency but denies any other lower urinary tract symptoms, including hematuria.  Case discussed with Critical Care by ED attending; hospitalist asked to admit.  Of note, history of prolonged prior exposure to high dose prednisone for her history of autoimmune disease.  Review of Systems: 12 systems reviewed and negative except as stated in the HPI.  Past Medical History  Diagnosis Date  . 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 Anticardiolipin antibody positive     sees Dr. Cyndie Chime  . Hypothyroid   . Dysphagia as late effect of stroke 03/27/2012    Possible component of reflux esophagitis  . Antiphospholipid antibody with hypercoagulable state (HCC) 03/27/2012    Initial presentation 05/2010 with acute left MCA stroke  . Chronic anticoagulation 11/05/2013  . Autoimmune hepatitis (HCC)     sees Dr. Daun Peacock    Past Surgical History  Procedure Laterality Date   . Tonsillectomy    . Lasik    . Appendectomy    . Cesarean section      3   Social History:  Social History   Social History Narrative   Married   Originally from [place], [place]   Avid tennis player  Former tobacco use.  Social EtOH.  No illicit drug use.  No Known Allergies  Family History  Problem Relation Age of Onset  . Arthritis    . Stroke    . Heart disease Father    Prior to Admission medications   Medication Sig Start Date End Date Taking? Authorizing Provider  aspirin 81 MG tablet Take 81 mg by mouth every evening.    Yes Historical Provider, MD  azaTHIOprine (IMURAN) 50 MG tablet Take 12.5 mg by mouth daily.  11/07/13  Yes Iva Boop, MD  ferrous sulfate 325 (65 FE) MG EC tablet Take 325 mg by mouth daily with breakfast.   Yes Historical Provider, MD  levothyroxine (SYNTHROID, LEVOTHROID) 75 MCG tablet Take 37.5 mcg by mouth at bedtime. Taking 1/2 tablet   Yes Historical Provider, MD  terbinafine (LAMISIL) 250 MG tablet Take 1 tablet (250 mg total) by mouth daily. 08/14/15  Yes Nelwyn Salisbury, MD  XARELTO 20 MG TABS tablet TAKE 1 TABLET BY MOUTH EVERY DAY Patient taking differently: TAKE 1 TABLET BY MOUTH EVERY DAY AT SUPPER 08/21/15  Yes Levert Feinstein, MD   Physical Exam: Filed Vitals:   08/31/15 2145 08/31/15 2200 08/31/15 2215 08/31/15 2304  BP:  90/54  Pulse: 70 65 59 63  Temp:      TempSrc:      Resp: SpO2: 100% 100% 100% 100%     General:  Awake and alert, very pleasant, NAD, nontoxic appearing  Eyes: PERRL bilaterally  ENT: No nasal drainage, mucous membranes moist, posterior oropharynx clear  Neck: Supple  Cardiovascular: NR/RR, no murmur  Respiratory: CTA bilaterally  Abdomen: Soft, nontender, nondistended  Skin: left gluteal hematoma noted  Musculoskeletal: Moves all four extremities spontaneously  Psychiatric: Normal mood and affect  Neurologic: No focal deficits  Labs on  Admission:  Basic Metabolic Panel:  Recent Labs Lab 08/31/15 1700  NA 139  K 3.4*  CL 104  CO2 25  GLUCOSE 111*  BUN 10  CREATININE 0.86  CALCIUM 9.0   CBC:  Recent Labs Lab 08/31/15 1700  WBC 13.0*  HGB 12.4  HCT 35.7*  MCV 91.5  PLT 259   Cardiac Enzymes:  Recent Labs Lab 08/31/15 1700  TROPONINI <0.03    Radiological Exams on Admission: Dg Chest 2 View  08/31/2015   CLINICAL DATA:  Trauma secondary to falling down 14 wooden stairs at home today.  EXAM: CHEST  2 VIEW  COMPARISON:  None.  FINDINGS: The heart size and mediastinal contours are within normal limits. Both lungs are clear. The visualized skeletal structures are unremarkable.  IMPRESSION: Normal chest.   Electronically Signed   By: Francene Boyers M.D.   On: 08/31/2015 18:08   Dg Hip Unilat With Pelvis 2-3 Views Left  08/31/2015   CLINICAL DATA:  Left hip pain and swelling secondary to falling down stairs at home today.  EXAM: DG HIP (WITH OR WITHOUT PELVIS) 2-3V LEFT  COMPARISON:  None.  FINDINGS: There is no evidence of hip fracture or dislocation. There is no evidence of arthropathy or other focal bone abnormality.  IMPRESSION: Negative.   Electronically Signed   By: Francene Boyers M.D.   On: 08/31/2015 18:09    EKG: Independently reviewed. NSR, no acute ST segment changes  Assessment/Plan Principal Problem:   Sepsis with hypotension (HCC) Active Problems:   Chronic anticoagulation   UTI (lower urinary tract infection)   Hematoma   Hypokalemia   1.  Admit to stepdown unit, telemetry  2.  Sepsis with hypotension, secondary UTI with suspicion for adrenal insufficiency in th setting of acute illness --Continue IV rocephin for now.  Blood and urine cultures pending. --S/P 3L of normal saline in ED and BP appears to be starting to trend upward.  Continue NS at 100cc/hr --Stress dose steroids, hydrocortisone  q8h --Serial troponin but will defer decision for echo to the AM attending --The  patient would like to see Dr. Cyndie Chime in the AM --LFTs in AM, will hold lamisil for now.  Will also hold imuran for now in the setting of acute illness  3.  Hematoma, on Xarelto --Type and screen as part of sepsis protocoal --Monitor H/H --Continuing anticoagulation for now given risk for blood clot  4.  Hypokalemia --Replace as needed   Code Status: FULL Family Communication: Significant other at bedside Disposition Plan: Expect her to be here at least two midnights  Time spent: 70 minutes  [employer] [facility]  08/31/2015, 11:27 PM

## 2015-08-31 NOTE — ED Provider Notes (Signed)
CSN: [id number]     Arrival date & time 08/31/15  1628 History   First MD Initiated Contact with Patient 08/31/15 1707     Chief Complaint  Patient presents with  . Loss of Consciousness     (Consider location/radiation/quality/duration/timing/severity/associated sxs/prior Treatment) HPI Comments: Patient is a 49 year old female with a past medical history of hypothyroidism, previous left MCA stroke, antiphospholipid antibody syndrome, and autoimmune hepatitis who presents with persistent lightheadedness and "passing out"/near syncope since this morning. She reports she does not lose consciousness when she passes out. Patient reports she initially fell this morning coming down the stairs. She denies lightheadedness at this time-she states she slipped and landed on her left buttock. Since this fall, she reports persistent near syncopal episodes. She denies chest pain and shortness of breath or any other symptoms.    Past Medical History  Diagnosis Date  . 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 Anticardiolipin antibody positive     sees Dr. Cyndie Chime  . Hypothyroid   . Dysphagia as late effect of stroke 03/27/2012    Possible component of reflux esophagitis  . Antiphospholipid antibody with hypercoagulable state (HCC) 03/27/2012    Initial presentation 05/2010 with acute left MCA stroke  . Chronic anticoagulation 11/05/2013  . Autoimmune hepatitis (HCC)     sees Dr. Daun Peacock    Past Surgical History  Procedure Laterality Date  . Tonsillectomy    . Lasik    . Appendectomy    . Cesarean section      3   Family History  Problem Relation Age of Onset  . Arthritis    . Stroke    . Heart disease Father    Social History  Substance Use Topics  . Smoking status: Former Smoker    Types: Cigarettes    Quit date: 06/17/2010  . Smokeless tobacco: Never Used  . Alcohol Use: 0.0 oz/week    0  Standard drinks or equivalent per week     Comment: socially   OB History    Gravida Para Term Preterm AB TAB SAB Ectopic Multiple Living   Review of Systems  Skin: Positive for color change.  Neurological: Positive for light-headedness.  All other systems reviewed and are negative.     Allergies  Review of patient's allergies indicates no known allergies.  Home Medications   Prior to Admission medications   Medication Sig Start Date End Date Taking? Authorizing Provider  aspirin 81 MG tablet Take 81 mg by mouth every evening.     Historical Provider, MD  azaTHIOprine (IMURAN) 50 MG tablet Take 25 mg by mouth daily.  11/07/13   Iva Boop, MD  levothyroxine (SYNTHROID, LEVOTHROID) 75 MCG tablet Take 75 mcg by mouth at bedtime. Taking 1/2 tablet    Historical Provider, MD  terbinafine (LAMISIL) 250 MG tablet Take 1 tablet (250 mg total) by mouth daily. 08/14/15   Nelwyn Salisbury, MD  XARELTO 20 MG TABS tablet TAKE 1 TABLET BY MOUTH EVERY DAY 08/21/15   Levert Feinstein, MD   BP 108/96 mmHg  Pulse 65  Temp(Src) 97.4 F (36.3 C) (Oral)  Resp 15  SpO2 100%  LMP 08/24/2015 Physical Exam  Constitutional: She is oriented to person, place, and time. She appears well-developed and well-nourished. No distress.  HENT:  Head:  Normocephalic and atraumatic.  Eyes: Conjunctivae and EOM are normal.  Neck: Normal range of motion.  Cardiovascular: Normal rate and regular rhythm.  Exam reveals no gallop and no friction rub.   No murmur heard. Pulmonary/Chest: Effort normal and breath sounds normal. She has no wheezes. She has no rales. She exhibits no tenderness.  Abdominal: Soft. She exhibits no distension. There is no tenderness. There is no rebound.  Musculoskeletal: Normal range of motion.  No midline spine tenderness to palpation. Left gluteus bruising, swelling and tenderness to palpation. No open wound.   Neurological: She is alert and oriented to person,  place, and time. No cranial nerve deficit. Coordination normal.  Speech is goal-oriented. Moves limbs without ataxia.   Skin: Skin is warm and dry.  Psychiatric: She has a normal mood and affect. Her behavior is normal.  Nursing note and vitals reviewed.   ED Course  Procedures (including critical care time)  CRITICAL CARE Performed by: Emilia Beck   Total critical care time: 45 min  Critical care time was exclusive of separately billable procedures and treating other patients.  Critical care was necessary to treat or prevent imminent or life-threatening deterioration.  Critical care was time spent personally by me on the following activities: development of treatment plan with patient and/or surrogate as well as nursing, discussions with consultants, evaluation of patient's response to treatment, examination of patient, obtaining history from patient or surrogate, ordering and performing treatments and interventions, ordering and review of laboratory studies, ordering and review of radiographic studies, pulse oximetry and re-evaluation of patient's condition.   Labs Review Labs Reviewed  BASIC METABOLIC PANEL - Abnormal; Notable for the following:    Potassium 3.4 (*)    Glucose, Bld 111 (*)    All other components within normal limits  CBC - Abnormal; Notable for the following:    WBC 13.0 (*)    HCT 35.7 (*)    All other components within normal limits  URINALYSIS, ROUTINE W REFLEX MICROSCOPIC (NOT AT [facility]) - Abnormal; Notable for the following:    APPearance CLOUDY (*)    Specific Gravity, Urine >1.030 (*)    Ketones, ur 15 (*)    Nitrite POSITIVE (*)    All other components within normal limits  URINE MICROSCOPIC-ADD ON - Abnormal; Notable for the following:    Bacteria, UA MANY (*)    All other components within normal limits  URINE CULTURE  CULTURE, BLOOD (ROUTINE X 2)  CULTURE, BLOOD (ROUTINE X 2)  TROPONIN I  I-STAT TROPOININ, ED  I-STAT CG4 LACTIC ACID,  ED  I-STAT CG4 LACTIC ACID, ED    Imaging Review Dg Chest 2 View  08/31/2015   CLINICAL DATA:  Trauma secondary to falling down 14 wooden stairs at home today.  EXAM: CHEST  2 VIEW  COMPARISON:  None.  FINDINGS: The heart size and mediastinal contours are within normal limits. Both lungs are clear. The visualized skeletal structures are unremarkable.  IMPRESSION: Normal chest.   Electronically Signed   By: Francene Boyers M.D.   On: 08/31/2015 18:08   Dg Hip Unilat With Pelvis 2-3 Views Left  08/31/2015   CLINICAL DATA:  Left hip pain and swelling secondary to falling down stairs at home today.  EXAM: DG HIP (WITH OR WITHOUT PELVIS) 2-3V LEFT  COMPARISON:  None.  FINDINGS: There is no evidence of hip fracture or dislocation. There is no evidence of arthropathy or other focal bone abnormality.  IMPRESSION: Negative.   Electronically  Signed   By: Francene Boyers M.D.   On: 08/31/2015 18:09   I have personally reviewed and evaluated these images and lab results as part of my medical decision-making.   EKG Interpretation   Date/Time:  Sunday August 31 2015 16:44:26 EDT Ventricular Rate:  54 PR Interval:  138 QRS Duration: 82 QT Interval:  430 QTC Calculation: 407 R Axis:   84 Text Interpretation:  Sinus bradycardia with sinus arrhythmia Otherwise  normal ECG No significant change since last tracing Confirmed by LITTLE  MD, RACHEL ([id number]) on 08/31/2015 5:15:23 PM      MDM   Final diagnoses:  UTI (lower urinary tract infection)  Idiopathic hypotension    5:21 PM Labs and urinalysis pending. Patient's vital show hypotension with remaining vitals stable. Patient will have fluids. Hip xray pending for bruising of left gluteus.   8:50 PM Hips xray unremarkable for acute changes. Patient's urine positive for bacteria and nitrites. Patient persistently hypotensive and is currently receiving third liter bolus. Patient received 1g Rocephin here for UTI. Remaining vitals stable.   Patient  persistently hypotensive despite receiving 3 liters of fluids. Critical care contacted who saw the patient and agree the patient would be more appropriately admitted to [facility]. Dr. Montez Morita will admit the patient.   Emilia Beck, PA-C 08/31/15 2226  Laurence Spates, MD 09/02/15 [phone]

## 2015-09-01 DIAGNOSIS — A419 Sepsis, unspecified organism: Secondary | ICD-10-CM

## 2015-09-01 LAB — CORTISOL: Cortisol, Plasma: 4.3 ug/dL

## 2015-09-01 LAB — COMPREHENSIVE METABOLIC PANEL
ALBUMIN: 2.7 g/dL — AB (ref 3.5–5.0)
ALT: 10 U/L — ABNORMAL LOW (ref 14–54)
ANION GAP: 5 (ref 5–15)
AST: 14 U/L — AB (ref 15–41)
Alkaline Phosphatase: 42 U/L (ref 38–126)
BILIRUBIN TOTAL: 0.4 mg/dL (ref 0.3–1.2)
BUN: 7 mg/dL (ref 6–20)
CHLORIDE: 111 mmol/L (ref 101–111)
CO2: 23 mmol/L (ref 22–32)
Calcium: 8.3 mg/dL — ABNORMAL LOW (ref 8.9–10.3)
Creatinine, Ser: 0.77 mg/dL (ref 0.44–1.00)
GFR calc Af Amer: >60 mL/min (ref >60)
GFR calc non Af Amer: >60 mL/min (ref >60)
GLUCOSE: 117 mg/dL — AB (ref 65–99)
POTASSIUM: 4 mmol/L (ref 3.5–5.1)
SODIUM: 139 mmol/L (ref 135–145)
TOTAL PROTEIN: 5.2 g/dL — AB (ref 6.5–8.1)

## 2015-09-01 LAB — TYPE AND SCREEN
ABO/RH(D): A POS
Antibody Screen: NEGATIVE

## 2015-09-01 LAB — PROCALCITONIN

## 2015-09-01 LAB — CBC
HCT: 29.5 % — ABNORMAL LOW (ref 36.0–46.0)
HEMOGLOBIN: 10.2 g/dL — AB (ref 12.0–15.0)
MCH: 31.7 pg (ref 26.0–34.0)
MCHC: 34.6 g/dL (ref 30.0–36.0)
MCV: 91.6 fL (ref 78.0–100.0)
PLATELETS: 222 10*3/uL (ref 150–400)
RBC: 3.22 MIL/uL — AB (ref 3.87–5.11)
RDW: 12.9 % (ref 11.5–15.5)
WBC: 4.9 10*3/uL (ref 4.0–10.5)

## 2015-09-01 LAB — TROPONIN I: Troponin I: <0.03 ng/mL (ref <0.031)

## 2015-09-01 LAB — MRSA PCR SCREENING: MRSA by PCR: NEGATIVE

## 2015-09-01 LAB — PROTIME-INR
INR: 1.24 (ref 0.00–1.49)
PROTHROMBIN TIME: 15.8 s — AB (ref 11.6–15.2)

## 2015-09-01 LAB — ABO/RH: ABO/RH(D): A POS

## 2015-09-01 LAB — APTT: APTT: 49 s — AB (ref 24–37)

## 2015-09-01 MED ORDER — LEVOTHYROXINE SODIUM 75 MCG PO TABS
37.5000 ug | ORAL_TABLET | Freq: Every day | ORAL | Status: DC
  Administered 2015-09-01: 37.5 ug via ORAL
  Filled 2015-09-01 (×2): qty 0.5

## 2015-09-01 MED ORDER — POTASSIUM CHLORIDE 20 MEQ PO PACK: 40.0000 meq | PACK | Freq: Once | ORAL | Status: DC

## 2015-09-01 MED ORDER — ACETAMINOPHEN 325 MG PO TABS: 650.0000 mg | ORAL_TABLET | Freq: Four times a day (QID) | ORAL | Status: DC | PRN

## 2015-09-01 MED ORDER — DEXTROSE 5 % IV SOLN
2.0000 g | INTRAVENOUS | Status: DC
  Administered 2015-09-01: 2 g via INTRAVENOUS
  Filled 2015-09-01 (×2): qty 2

## 2015-09-01 MED ORDER — RIVAROXABAN 20 MG PO TABS
20.0000 mg | ORAL_TABLET | Freq: Every day | ORAL | Status: DC
  Administered 2015-09-01: 20 mg via ORAL
  Filled 2015-09-01: qty 1

## 2015-09-01 MED ORDER — SODIUM CHLORIDE 0.9 % IJ SOLN
3.0000 mL | Freq: Two times a day (BID) | INTRAMUSCULAR | Status: DC
  Administered 2015-09-01: 3 mL via INTRAVENOUS

## 2015-09-01 MED ORDER — ASPIRIN 81 MG PO TABS: 81.0000 mg | ORAL_TABLET | Freq: Every evening | ORAL | Status: DC

## 2015-09-01 MED ORDER — POTASSIUM CHLORIDE CRYS ER 20 MEQ PO TBCR
40.0000 meq | EXTENDED_RELEASE_TABLET | Freq: Once | ORAL | Status: AC
  Administered 2015-09-01: 40 meq via ORAL
  Filled 2015-09-01: qty 2

## 2015-09-01 MED ORDER — ASPIRIN 81 MG PO CHEW
81.0000 mg | CHEWABLE_TABLET | Freq: Every day | ORAL | Status: DC
  Administered 2015-09-01: 81 mg via ORAL
  Filled 2015-09-01: qty 1

## 2015-09-01 MED ORDER — ACETAMINOPHEN 650 MG RE SUPP: 650.0000 mg | Freq: Four times a day (QID) | RECTAL | Status: DC | PRN

## 2015-09-01 MED ORDER — RIVAROXABAN 20 MG PO TABS: ORAL_TABLET | ORAL | Status: DC

## 2015-09-01 MED ORDER — HYDROCORTISONE NA SUCCINATE PF 100 MG IJ SOLR
100.0000 mg | Freq: Three times a day (TID) | INTRAMUSCULAR | Status: DC
  Administered 2015-09-01 (×2): 100 mg via INTRAVENOUS
  Filled 2015-09-01 (×5): qty 2

## 2015-09-01 MED ORDER — FERROUS SULFATE 325 (65 FE) MG PO TABS
325.0000 mg | ORAL_TABLET | Freq: Every day | ORAL | Status: DC
  Administered 2015-09-01: 325 mg via ORAL
  Filled 2015-09-01 (×2): qty 1

## 2015-09-01 MED ORDER — FERROUS SULFATE 325 (65 FE) MG PO TBEC: 325.0000 mg | DELAYED_RELEASE_TABLET | Freq: Every day | ORAL | Status: DC

## 2015-09-01 MED ORDER — SODIUM CHLORIDE 0.9 % IV SOLN
INTRAVENOUS | Status: DC
  Administered 2015-09-01 (×2): via INTRAVENOUS

## 2015-09-01 NOTE — Discharge Instructions (Signed)
Hematoma  A hematoma is a collection of blood under the skin, in an organ, in a body space, in a joint space, or in other tissue. The blood can clot to form a lump that you can see and feel. The lump is often firm and may sometimes become sore and tender. Most hematomas get better in a few days to weeks. However, some hematomas may be serious and require medical care. Hematomas can range in size from very small to very large.  CAUSES   A hematoma can be caused by a blunt or penetrating injury. It can also be caused by spontaneous leakage from a blood vessel under the skin. Spontaneous leakage from a blood vessel is more likely to occur in older people, especially those taking blood thinners. Sometimes, a hematoma can develop after certain medical procedures.  SIGNS AND SYMPTOMS   · A firm lump on the body.  · Possible pain and tenderness in the area.  · Bruising. Blue, dark blue, purple-red, or yellowish skin may appear at the site of the hematoma if the hematoma is close to the surface of the skin.  For hematomas in deeper tissues or body spaces, the signs and symptoms may be subtle. For example, an intra-abdominal hematoma may cause abdominal pain, weakness, fainting, and shortness of breath. An intracranial hematoma may cause a headache or symptoms such as weakness, trouble speaking, or a change in consciousness.  DIAGNOSIS   A hematoma can usually be diagnosed based on your medical history and a physical exam. Imaging tests may be needed if your health care provider suspects a hematoma in deeper tissues or body spaces, such as the abdomen, head, or chest. These tests may include ultrasonography or a CT scan.   TREATMENT   Hematomas usually go away on their own over time. Rarely does the blood need to be drained out of the body. Large hematomas or those that may affect vital organs will sometimes need surgical drainage or monitoring.  HOME CARE INSTRUCTIONS   · Apply ice to the injured area:      Put ice in a  plastic bag.      Place a towel between your skin and the bag.      Leave the ice on for 20 minutes, 2-3 times a day for the first 1 to 2 days.    · After the first 2 days, switch to using warm compresses on the hematoma.    · Elevate the injured area to help decrease pain and swelling. Wrapping the area with an elastic bandage may also be helpful. Compression helps to reduce swelling and promotes shrinking of the hematoma. Make sure the bandage is not wrapped too tight.    · If your hematoma is on a lower extremity and is painful, crutches may be helpful for a couple days.    · Only take over-the-counter or prescription medicines as directed by your health care provider.  SEEK IMMEDIATE MEDICAL CARE IF:   · You have increasing pain, or your pain is not controlled with medicine.    · You have a fever.    · You have worsening swelling or discoloration.    · Your skin over the hematoma breaks or starts bleeding.    · Your hematoma is in your chest or abdomen and you have weakness, shortness of breath, or a change in consciousness.  · Your hematoma is on your scalp (caused by a fall or injury) and you have a worsening headache or a change in alertness or consciousness.  MAKE SURE YOU:   ·   Understand these instructions.  · Will watch your condition.  · Will get help right away if you are not doing well or get worse.     This information is not intended to replace advice given to you by your health care provider. Make sure you discuss any questions you have with your health care provider.     Document Released: 06/22/2004 Document Revised: 07/11/2013 Document Reviewed: 04/18/2013  Elsevier Interactive Patient Education ©2016 [employer]

## 2015-09-01 NOTE — Progress Notes (Signed)
Discharge instructions reviewed with patient.  IV d/cd, cath tip intact.  Pt dressing independently; significant other, [name], here to accompany patient to home.

## 2015-09-01 NOTE — Telephone Encounter (Signed)
FYI, pt at Er now.

## 2015-09-01 NOTE — Telephone Encounter (Signed)
[facility] Night - Client TELEPHONE ADVICE RECORD [facility] Patient Name: CARLINDA OHLSON Gender: Female DOB: 1966/01/11 Age: 49 Y 5 M 25 D Return Phone Number: Address: City/State/Zip: [place] [facility] - Client Client Site  [facility] Physician [facility] Contact Type Call Caller Name Tamiki Kuba Caller Phone Number [phone] Relationship To Patient Self Is this call to report lab results? No Call Type General Information Initial Comment On way to ER, fell down stairs. Declined triage. General Information Type Message Only Nurse Assessment Guidelines Guideline Title Affirmed Question Affirmed Notes Nurse Date/Time (Eastern Time) Disp. Time Lamount Cohen Time) Disposition Final User 08/31/2015 4:18:44 PM General Information Provided Yes Rowe Clack

## 2015-09-01 NOTE — Care Management Note (Addendum)
Case Management Note  Patient Details  Name: Natalie Mullen MRN: [id number] Date of Birth: Apr 01, 1966  Subjective/Objective:             Admitted with Sepsis /hypotension secondary to UTI  s/p fall from home with sgo. Independent with ADL'S.      Action/Plan: Return to home when medically stable. CM to f/u with discharge needs.  Expected Discharge Date:                  Expected Discharge Plan:  Home/Self Care  In-House Referral:     Discharge planning Services  CM Consult  Post Acute Care Choice:    Choice offered to:     DME Arranged:    DME Agency:     HH Arranged:    HH Agency:     Status of Service:  In process, will continue to follow  Medicare Important Message Given:    Date Medicare IM Given:    Medicare IM give by:    Date Additional Medicare IM Given:    Additional Medicare Important Message give by:     If discussed at Long Length of Stay Meetings, dates discussed:    Additional Comments: Bert(sgo) [phone]  Gae Gallop [place], [place] [phone] 09/01/2015, 9:13 AM

## 2015-09-01 NOTE — Discharge Summary (Signed)
DISCHARGE SUMMARY  Sitara Cashwell  MR#: [id number]  DOB:[birth year]  Date of Admission: 08/31/2015 Date of Discharge: 09/01/2015  Attending Physician:MCCLUNG,JEFFREY T  Patient's PCP:FRY,STEPHEN A, MD  Consults:  none  Disposition: D/C home   Follow-up Appts:     Follow-up Information    Follow up with FRY,STEPHEN A, MD In 1 week.   Specialty:  Family Medicine   Contact information:   [place] [address] [phone]       Follow up with Renato Shin, MD. Schedule an appointment as soon as possible for a visit in 1 week.   Specialty:  Endocrinology   Contact information:   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 woman with a history of Antiphospholipid antibody syndrome (on Xarelto), autoimmune hepatitis, hypothyroidism, and prior CVA who was at her baseline state of health until she had a very dramatic fall down a flight of stairs. Her significant other witnessed it, but they both thought it was accidental at the time. Later that same day the patient became light headed and near syncopal with aura. No chest pain or shortness of breath. This prompted presentation to the ED where she was found to have hypotension (systolic BP normally in the low 100s) and a a large hematoma of the left buttock. U/A was not normal, but not strongly suggestive of UTI either.     Hospital Course:  ?Sepsis due to UTI - doubt  Doubt she has a  true infectious sepsis - SIRS criteria NOT met and UA is not convincing for significant UTI - no further abx planned   Hypotension Per records, BP is typically ~166 systolic - SBP appears to be holding steady in the 90s - outpt w/u underway for possible adrenal insufficiency - pt not willing to start steroid tx presently - pt very strongly desires d/c home - I do not feel this is unreasonable as she does not live alone and has reliable outpt f/u   L buttock Hematoma s/p fall on Xarelto  Pt to follow size and report to her PCP should it continue to enlarge - hold ASA for one week   Acute blood loss anemia Hgb 12.4 at presentation and dropped to 10.2 - this appears to be due to her hematoma - no other source of blood loss evident - hemodynamically stable   Antiphospholipid antibody syndrome Cont Xarelto due to high risk of clotting   Status post left MCA stroke secondary   Hypokalemia  Corrected to normal   Chronic Primary Hypothyroidism Cont synthroid - now being followed by Endo  Autoimmune hepatitis   Chronic unusual bronzing of the skin    Medication List    TAKE these medications        aspirin 81 MG tablet  Take 1 tablet (81 mg total) by mouth every evening.  Start taking on:  09/08/2015     azaTHIOprine 50 MG tablet  Commonly known as:  IMURAN  Take 12.5 mg by mouth daily.  ferrous sulfate 325 (65 FE) MG EC tablet  Take 325 mg by mouth daily with breakfast.     levothyroxine 75 MCG tablet  Commonly known as:  SYNTHROID, LEVOTHROID  Take 37.5 mcg by mouth at bedtime. Taking 1/2 tablet     rivaroxaban 20 MG Tabs tablet  Commonly known as:  XARELTO  TAKE 1 TABLET BY MOUTH EVERY DAY AT SUPPER     terbinafine 250 MG tablet  Commonly known as:  LAMISIL  Take 1 tablet (250 mg total) by mouth daily.       Day of Discharge BP 92/44 mmHg  Pulse 50  Temp(Src) 97.9 F (36.6 C) (Oral)  Resp 20  Ht 5' 7"  (1.702 m)  Wt 58.1 kg (128 lb 1.4 oz)  BMI 20.06 kg/m2   SpO2 100%  LMP 08/17/2015 (Approximate)  Physical Exam: General: No acute respiratory distress Lungs: Clear to auscultation bilaterally without wheezes or crackles Cardiovascular: Regular rate and rhythm without murmur gallop or rub normal S1 and S2 Abdomen: Nontender, nondistended, soft, bowel sounds positive, no rebound, no ascites, no appreciable mass Extremities: No significant cyanosis, clubbing, or edema bilateral lower extremities  Basic Metabolic Panel:  Recent Labs Lab 08/31/15 1700 09/01/15 0556  NA 139 139  K 3.4* 4.0  CL 104 111  CO2 25 23  GLUCOSE 111* 117*  BUN 10 7  CREATININE 0.86 0.77  CALCIUM 9.0 8.3*    Liver Function Tests:  Recent Labs Lab 09/01/15 0556  AST 14*  ALT 10*  ALKPHOS 42  BILITOT 0.4  PROT 5.2*  ALBUMIN 2.7*   Coags:  Recent Labs Lab 09/01/15 0556  INR 1.24   CBC:  Recent Labs Lab 08/31/15 1700 09/01/15 0556  WBC 13.0* 4.9  HGB 12.4 10.2*  HCT 35.7* 29.5*  MCV 91.5 91.6  PLT 259 222    Cardiac Enzymes:  Recent Labs Lab 08/31/15 1700 09/01/15 0049 09/01/15 0556  TROPONINI <0.03 <0.03 <0.03    Recent Results (from the past 240 hour(s))  Urine culture     Status: None (Preliminary result)   Collection Time: 08/31/15  6:10 PM  Result Value Ref Range Status   Specimen Description URINE, CLEAN CATCH  Final   Special Requests NONE  Final   Culture CULTURE REINCUBATED FOR BETTER GROWTH  Final   Report Status PENDING  Incomplete  Blood culture (routine x 2)     Status: None (Preliminary result)   Collection Time: 08/31/15  8:42 PM  Result Value Ref Range Status   Specimen Description BLOOD RIGHT ANTECUBITAL  Final   Special Requests BOTTLES DRAWN AEROBIC AND ANAEROBIC 5CC   Final   Culture NO GROWTH < 24 HOURS  Final   Report Status PENDING  Incomplete  Blood culture (routine x 2)     Status: None (Preliminary result)   Collection Time: 08/31/15 10:30 PM  Result Value Ref Range Status   Specimen  Description BLOOD LEFT WRIST  Final   Special Requests BOTTLES DRAWN AEROBIC AND ANAEROBIC 5CC  Final   Culture NO GROWTH < 24 HOURS  Final   Report Status PENDING  Incomplete  MRSA PCR Screening     Status: None   Collection Time: 09/01/15 12:55 AM  Result Value Ref Range Status   MRSA by PCR NEGATIVE NEGATIVE Final    Comment:        The GeneXpert MRSA Assay (FDA approved for NASAL specimens only), is one component of a comprehensive MRSA colonization surveillance program. It is  not intended to diagnose MRSA infection nor to guide or monitor treatment for MRSA infections.     Time spent in discharge (includes decision making & examination of pt): 30 minutes  09/01/2015, 1:55 PM   Cherene Altes, MD [facility] Office  [phone] Pager [phone]  On-Call/Text Page:      [email or web address]      password [facility]

## 2015-09-01 NOTE — Progress Notes (Signed)
ANTIBIOTIC CONSULT NOTE - INITIAL  Pharmacy Consult for Rocephin Indication: UTI  No Known Allergies   Vital Signs: Temp: 97.9 F (36.6 C) (10/09 1952) Temp Source: Oral (10/09 1952) BP: 92/51 mmHg (10/09 2330) Pulse Rate: 57 (10/09 2330)  Labs:  Recent Labs  08/31/15 1700  WBC 13.0*  HGB 12.4  PLT 259  CREATININE 0.86   Estimated Creatinine Clearance: 74.8 mL/min (by C-G formula based on Cr of 0.86).   Assessment/Plan:  49yo female c/o fall w/ pain and bruising, reports syncopal feeling, found to be hypotensive, concern for early sepsis, UA c/w UTI, to begin IV ABX.  Rec'd Rocephin 1g in ED; will continue with Rocephin 2g IV Q24H and monitor CBC and Cx.  Vernard Gambles, PharmD, BCPS  09/01/2015,12:35 AM

## 2015-09-02 LAB — URINE CULTURE

## 2015-09-03 ENCOUNTER — Ambulatory Visit (INDEPENDENT_AMBULATORY_CARE_PROVIDER_SITE_OTHER): Payer: BLUE CROSS/BLUE SHIELD | Admitting: Family Medicine

## 2015-09-03 ENCOUNTER — Encounter: Payer: Self-pay | Admitting: Family Medicine

## 2015-09-03 VITALS — BP 102/57 | HR 58 | Temp 98.3°F | Ht 67.0 in | Wt 149.0 lb

## 2015-09-03 DIAGNOSIS — R3915 Urgency of urination: Secondary | ICD-10-CM | POA: Diagnosis not present

## 2015-09-03 DIAGNOSIS — I959 Hypotension, unspecified: Secondary | ICD-10-CM

## 2015-09-03 DIAGNOSIS — N390 Urinary tract infection, site not specified: Secondary | ICD-10-CM

## 2015-09-03 LAB — POCT URINALYSIS DIPSTICK
Bilirubin, UA: NEGATIVE
Glucose, UA: NEGATIVE
Ketones, UA: NEGATIVE
Leukocytes, UA: NEGATIVE
NITRITE UA: NEGATIVE
PH UA: 6
Protein, UA: 15
Spec Grav, UA: >=1.03
UROBILINOGEN UA: 0.2

## 2015-09-03 MED ORDER — CIPROFLOXACIN HCL 500 MG PO TABS: 500.0000 mg | ORAL_TABLET | Freq: Two times a day (BID) | ORAL | Status: DC

## 2015-09-03 NOTE — Progress Notes (Signed)
Pre visit review using our clinic review tool, if applicable. No additional management support is needed unless otherwise documented below in the visit note. 

## 2015-09-03 NOTE — Progress Notes (Signed)
   Subjective:    Patient ID: Natalie Mullen, female    DOB: 06-15-1966, 49 y.o.   MRN: [id number]  HPI Here for an apparent UTI. For the past 4 days she has had urgency to urinate with some blood in the urine. No fever or back pain or nausea. Of note she was recently hospitalized for hypotension and near syncope, and was treated with IV fluids. She may have adrenal insufficiency and she is waiting for an appt to see Dr. Everardo AllEllison about this. She has had no dizziness since going home.    Review of Systems  Constitutional: Negative.   Respiratory: Negative.   Cardiovascular: Negative.   Gastrointestinal: Negative.   Genitourinary: Positive for urgency, frequency and hematuria. Negative for dysuria, flank pain and pelvic pain.  Neurological: Negative.        Objective:   Physical Exam  Constitutional: She appears well-developed and well-nourished. No distress.  Cardiovascular: Normal rate, regular rhythm, normal heart sounds and intact distal pulses.   Pulmonary/Chest: Effort normal and breath sounds normal.  Abdominal: Soft. Bowel sounds are normal. She exhibits no distension and no mass. There is no tenderness. There is no rebound and no guarding.          Assessment & Plan:  UTI, treat with Cipro. Drink plenty of water.

## 2015-09-05 ENCOUNTER — Other Ambulatory Visit: Payer: Self-pay | Admitting: Endocrinology

## 2015-09-05 LAB — URINE CULTURE
COLONY COUNT: NO GROWTH
ORGANISM ID, BACTERIA: NO GROWTH

## 2015-09-05 LAB — CULTURE, BLOOD (ROUTINE X 2)
CULTURE: NO GROWTH
CULTURE: NO GROWTH

## 2015-09-05 LAB — ALPHA MELANOCYTE STIM HORMONE

## 2015-09-05 MED ORDER — PREDNISONE 5 MG PO TABS: 5.0000 mg | ORAL_TABLET | Freq: Every day | ORAL | Status: DC

## 2015-09-05 MED ORDER — FLUDROCORTISONE ACETATE 0.1 MG PO TABS: 0.1000 mg | ORAL_TABLET | Freq: Every day | ORAL | Status: DC

## 2015-09-06 ENCOUNTER — Encounter: Payer: Self-pay | Admitting: Endocrinology

## 2015-09-08 ENCOUNTER — Encounter: Payer: Self-pay | Admitting: Endocrinology

## 2015-09-17 ENCOUNTER — Encounter: Payer: Self-pay | Admitting: Oncology

## 2015-09-17 ENCOUNTER — Ambulatory Visit (INDEPENDENT_AMBULATORY_CARE_PROVIDER_SITE_OTHER): Payer: BLUE CROSS/BLUE SHIELD | Admitting: Family Medicine

## 2015-09-17 ENCOUNTER — Encounter: Payer: Self-pay | Admitting: Family Medicine

## 2015-09-17 ENCOUNTER — Other Ambulatory Visit: Payer: Self-pay | Admitting: Family Medicine

## 2015-09-17 VITALS — BP 95/57 | HR 53 | Ht 67.0 in | Wt 149.0 lb

## 2015-09-17 DIAGNOSIS — Z23 Encounter for immunization: Secondary | ICD-10-CM

## 2015-09-17 DIAGNOSIS — S7002XD Contusion of left hip, subsequent encounter: Secondary | ICD-10-CM

## 2015-09-17 NOTE — Progress Notes (Signed)
   Subjective:    Patient ID: Natalie Mullen, female    DOB: 07/29/1966, 49 y.o.   MRN: [id number]  HPI Here to check a hematoma on the left buttock which is the result of a fall down some steps in her home on 08-31-15. She was seen at the ER and observed overnight. Xrays of her pelvis were negative. Since then this has gotten smaller and she has no trouble walking or sitting. She remains on Xarelto.    Review of Systems  Constitutional: Negative.   Respiratory: Negative.   Cardiovascular: Negative.   Musculoskeletal: Positive for myalgias.       Objective:   Physical Exam  Constitutional: She appears well-developed and well-nourished. No distress.  Cardiovascular: Normal rate, regular rhythm, normal heart sounds and intact distal pulses.   Pulmonary/Chest: Effort normal and breath sounds normal.  Musculoskeletal:  Large hematoma over the left buttock, mildly tender. Full ROM of the hip           Assessment & Plan:  Resolving hematoma after a fall. This is progressing as expected. It should be gone in about 4-6 more weeks.

## 2015-09-17 NOTE — Progress Notes (Signed)
Pre visit review using our clinic review tool, if applicable. No additional management support is needed unless otherwise documented below in the visit note. 

## 2015-09-19 NOTE — Telephone Encounter (Signed)
Can we refill this? 

## 2015-09-22 ENCOUNTER — Ambulatory Visit (INDEPENDENT_AMBULATORY_CARE_PROVIDER_SITE_OTHER): Payer: BLUE CROSS/BLUE SHIELD | Admitting: Oncology

## 2015-09-22 ENCOUNTER — Encounter: Payer: Self-pay | Admitting: Oncology

## 2015-09-22 VITALS — BP 110/54 | HR 50 | Temp 97.6°F | Ht 67.0 in | Wt 141.2 lb

## 2015-09-22 DIAGNOSIS — Z8673 Personal history of transient ischemic attack (TIA), and cerebral infarction without residual deficits: Secondary | ICD-10-CM

## 2015-09-22 DIAGNOSIS — K754 Autoimmune hepatitis: Secondary | ICD-10-CM

## 2015-09-22 DIAGNOSIS — R760 Raised antibody titer: Secondary | ICD-10-CM

## 2015-09-22 DIAGNOSIS — S300XXD Contusion of lower back and pelvis, subsequent encounter: Secondary | ICD-10-CM

## 2015-09-22 DIAGNOSIS — T148XXA Other injury of unspecified body region, initial encounter: Secondary | ICD-10-CM

## 2015-09-22 DIAGNOSIS — E039 Hypothyroidism, unspecified: Secondary | ICD-10-CM | POA: Diagnosis not present

## 2015-09-22 DIAGNOSIS — Z7901 Long term (current) use of anticoagulants: Secondary | ICD-10-CM | POA: Diagnosis not present

## 2015-09-22 DIAGNOSIS — X58XXXD Exposure to other specified factors, subsequent encounter: Secondary | ICD-10-CM

## 2015-09-22 DIAGNOSIS — Z7982 Long term (current) use of aspirin: Secondary | ICD-10-CM | POA: Diagnosis not present

## 2015-09-22 DIAGNOSIS — D6861 Antiphospholipid syndrome: Secondary | ICD-10-CM | POA: Diagnosis not present

## 2015-09-22 DIAGNOSIS — E271 Primary adrenocortical insufficiency: Secondary | ICD-10-CM

## 2015-09-22 HISTORY — DX: Primary adrenocortical insufficiency: E27.1

## 2015-09-22 MED ORDER — RIVAROXABAN 20 MG PO TABS: ORAL_TABLET | ORAL | Status: DC

## 2015-09-22 NOTE — Progress Notes (Signed)
Patient ID: Natalie Mullen, female   DOB: [birth year], 49 y.o.   MRN: [id number] Hematology and Oncology Follow Up Visit  Natalie Mullen [id number] 08-Feb-1966 49 y.o. 09/22/2015 11:36 AM   Principle Diagnosis: Encounter Diagnoses  Name Primary?  . Chronic anticoagulation Yes  . Antiphospholipid antibody with hypercoagulable state (HCC)   . Autoimmune hepatitis (HCC)   . 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 Anticardiolipin antibody positive   . History of ischemic left MCA stroke   . Lupus anticoagulant positive   . Hematoma   Clinical Summary: 49 year old woman originally from [place] who presented with an acute left MCA distribution stroke in July of 2011 as the first sign of an underlying antiphospholipid antibody syndrome. She underwent thrombolytic therapy and thrombectomy and had a complete neurologic recovery. She was on full dose Coumadin anticoagulation and low-dose aspirin until December 2014 when I changed her to Xarelto plus low-dose aspirin. 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 Coumadin stopped due to the difficulty adjusting the dose in view of both her antiphospholipid antibody syndrome and the acute liver injury. Fortunately her immune hepatitis is now controlled and she is off all steroids and other immunosuppressant drugs except for a small, 12.5 mg dose of azathioprine.. She has been followed by Dr. Leone Payor here in town and is also seeing a liver specialist Dr. Marita Snellen in [facility]. . Shortly after her visit with me in April of 2015, she developed headaches. No new focal neurologic deficits. CT scan of the head with and without contrast done on 04/03/2014 showed some chronic changes in the area of the previous stroke but no acute changes. Headaches have long since resolved.   Interim History:   2 significant changes since visit with me in April.  Exam at that time showed significant bronzing of the skin. I obtained a random a.m. cortisol which was in the normal reference range at 6.8 mcg/dL (7.8-29.5). Ceruloplasmin normal at 24 (18-53). Normal electrolytes at that time with sodium 139 and potassium 4.2. Normal liver chemistries. She was referred to endocrinology by her primary care physician and saw Dr. Everardo All. ACTH stimulation test was done on 08/27/2015. Her adrenal gland failed to respond to the ACTH. In addition, TSH was done and significantly elevated at 23 with concomitant free T4 0.81. She was started on both cortisone and thyroid hormone replacement.  She fell down a flight of stairs at home and sustained a huge hematoma of her left buttock requiring overnight hospitalization. Anticoagulation did not need to be stopped. Hemoglobin fell from her baseline of 12 g down to 10 g.  She tells me that she is going to enroll in an observational study of patients with antiphospholipid lipid antibody syndrome at [facility]. She has remarried and her husband accompanies her today. She is currently working as a Veterinary surgeon.  Medications: reviewed  Allergies: No Known Allergies  Review of Systems: See HPI Remaining ROS negative:   Physical Exam: Blood pressure 110/54, pulse 50, temperature 97.6 F (36.4 C), temperature source Oral, height  (1.702 m), weight 141 lb 3.2 oz (64.048 kg), last menstrual period 08/24/2015, SpO2 100 %. Wt Readings from Last 3 Encounters:  09/22/15 141 lb 3.2 oz (64.048 kg)  09/17/15 149 lb (67.586 kg)  09/03/15 149 lb (67.586 kg)     General appearance: thin woman, pronounced bronzing of skin HENNT: Pharynx no erythema, exudate, mass, or ulcer.  No thyromegaly or thyroid nodules Lymph nodes: No cervical, supraclavicular, or axillary lymphadenopathy Breasts: Lungs: Clear to auscultation, resonant to percussion throughout Heart: Regular rhythm, no murmur, no gallop, no rub, no click, no edema Abdomen: Soft,  nontender, normal bowel sounds, no mass, no organomegaly Extremities: No edema, no calf tenderness; large hematoma left buttock  Musculoskeletal: no joint deformities GU:  Vascular: Carotid pulses 2+, no bruits, distal pulses: Dorsalis pedis 1+ symmetric Neurologic: Alert, oriented, PERRLA, optic discs sharp and vessels normal, no hemorrhage or exudate, cranial nerves grossly normal, motor strength 5 over 5, reflexes 1+ symmetric, upper body coordination normal, gait normal, Skin: No rash or ecchymosis  Lab Results: CBC W/Diff    Component Value Date/Time   WBC 4.9 09/01/2015 0556   WBC 11.4* 11/05/2013 1404   RBC 3.22* 09/01/2015 0556   RBC 4.72 11/05/2013 1404   HGB 10.2* 09/01/2015 0556   HGB 14.0 11/05/2013 1404   HCT 29.5* 09/01/2015 0556   HCT 41.0 11/05/2013 1404   PLT 222 09/01/2015 0556   PLT 329 11/05/2013 1404   MCV 91.6 09/01/2015 0556   MCV 86.9 11/05/2013 1404   MCH 31.7 09/01/2015 0556   MCH 29.7 11/05/2013 1404   MCHC 34.6 09/01/2015 0556   MCHC 34.1 11/05/2013 1404   RDW 12.9 09/01/2015 0556   RDW 14.6* 11/05/2013 1404   LYMPHSABS 1.5 03/04/2015 1111   LYMPHSABS 1.9 11/05/2013 1404   MONOABS 0.4 03/04/2015 1111   MONOABS 0.5 11/05/2013 1404   EOSABS 0.3 03/04/2015 1111   EOSABS 0.0 11/05/2013 1404   BASOSABS 0.1 03/04/2015 1111   BASOSABS 0.0 11/05/2013 1404     Chemistry      Component Value Date/Time   NA 139 09/01/2015 0556   NA 138 11/05/2013 1404   K 4.0 09/01/2015 0556   K 3.9 11/05/2013 1404   CL 111 09/01/2015 0556   CL 105 04/03/2013 0849   CO2 23 09/01/2015 0556   CO2 25 11/05/2013 1404   BUN 7 09/01/2015 0556   BUN 11.7 11/05/2013 1404   CREATININE 0.77 09/01/2015 0556   CREATININE 0.91 03/04/2015 1111   CREATININE 0.9 11/05/2013 1404      Component Value Date/Time   CALCIUM 8.3* 09/01/2015 0556   CALCIUM 9.9 11/05/2013 1404   ALKPHOS 42 09/01/2015 0556   ALKPHOS 71 11/05/2013 1404   AST 14* 09/01/2015 0556   AST 169*  11/05/2013 1404   ALT 10* 09/01/2015 0556   ALT 267* 11/05/2013 1404   BILITOT 0.4 09/01/2015 0556   BILITOT 0.87 11/05/2013 1404       Radiological Studies: Dg Chest 2 View  08/31/2015  CLINICAL DATA:  Trauma secondary to falling down 14 wooden stairs at home today. EXAM: CHEST  2 VIEW COMPARISON:  None. FINDINGS: The heart size and mediastinal contours are within normal limits. Both lungs are clear. The visualized skeletal structures are unremarkable. IMPRESSION: Normal chest. Electronically Signed   By: Francene BoyersJames  Maxwell M.D.   On: 08/31/2015 18:08   Dg Hip Unilat With Pelvis 2-3 Views Left  08/31/2015  CLINICAL DATA:  Left hip pain and swelling secondary to falling down stairs at home today. EXAM: DG HIP (WITH OR WITHOUT PELVIS) 2-3V LEFT COMPARISON:  None. FINDINGS: There is no evidence of hip fracture or dislocation. There is no evidence of arthropathy or other focal bone abnormality. IMPRESSION: Negative. Electronically Signed   By: Francene BoyersJames  Maxwell M.D.   On: 08/31/2015 18:09    Impression:  #1. Antiphospholipid antibody  syndrome as first manifestation of a generalized autoimmune disorder which has now evolved to include hypothyroidism, Addison's disease, and autoimmune hepatitis.  #2. Status post left MCA stroke secondary to #1.   #3. Chronic anticoagulation now on Xarelto 20 mg daily and aspirin 81 mg daily.  #4. Autoimmune hepatitis  Complete response to steroids and Imuran currently on low dose azathioprine alone. Liver functions have normalized   #5. Unusual bronzing of the skin. First sign of Addison's disease fitting the pattern of her other autoimmune problems.  #6. New diagnosis hypothyroidism likely also autoimmune  #7. Traumatic hematoma left buttock while on anticoagulation.       CC: Patient Care Team: Nelwyn Salisbury, MD as PCP - General Levert Feinstein, MD as Consulting Physician (Oncology) Iva Boop, MD as Consulting Physician  (Gastroenterology)   Levert Feinstein, MD 10/31/201611:36 AM

## 2015-09-22 NOTE — Patient Instructions (Signed)
Lab 01/13/16 MD visit 1-2 weeks after lab

## 2015-09-24 ENCOUNTER — Ambulatory Visit (INDEPENDENT_AMBULATORY_CARE_PROVIDER_SITE_OTHER): Payer: BLUE CROSS/BLUE SHIELD | Admitting: Endocrinology

## 2015-09-24 VITALS — BP 122/64 | HR 48 | Temp 97.9°F | Ht 67.0 in | Wt 138.0 lb

## 2015-09-24 DIAGNOSIS — E039 Hypothyroidism, unspecified: Secondary | ICD-10-CM

## 2015-09-24 DIAGNOSIS — E271 Primary adrenocortical insufficiency: Secondary | ICD-10-CM

## 2015-09-24 LAB — BASIC METABOLIC PANEL
BUN: 13 mg/dL (ref 6–23)
CO2: 28 meq/L (ref 19–32)
Calcium: 9.3 mg/dL (ref 8.4–10.5)
Chloride: 105 mEq/L (ref 96–112)
Creatinine, Ser: 0.84 mg/dL (ref 0.40–1.20)
GFR: 75.81 mL/min (ref >60.00)
GLUCOSE: 90 mg/dL (ref 70–99)
POTASSIUM: 3.9 meq/L (ref 3.5–5.1)
SODIUM: 138 meq/L (ref 135–145)

## 2015-09-24 LAB — TSH: TSH: 7.22 u[IU]/mL — ABNORMAL HIGH (ref 0.35–4.50)

## 2015-09-24 LAB — VITAMIN B12: Vitamin B-12: 223 pg/mL (ref 211–911)

## 2015-09-24 MED ORDER — LEVOTHYROXINE SODIUM 100 MCG PO TABS: 100.0000 ug | ORAL_TABLET | Freq: Every day | ORAL | Status: DC

## 2015-09-24 NOTE — Progress Notes (Signed)
Subjective:    Patient ID: Natalie Mullen, female    DOB: 1965-12-06, 49 y.o.   MRN: [id number]  HPI Pt returns for fu of Addison's dx (dx'ed 2016, when she presented with diffuse hyperpigmentation).  Pt says she feels somewhat better since on meds.   Hypothyroidism: she takes synthroid as rx'ed. Past Medical History  Diagnosis Date  . Stroke [facility]) 05/2010    left middle cerebral artery stroke 05-2010 had intravasscular TPA with right hemiparesis and aphasia  . Anticardiolipin antibody positive     sees Dr. Cyndie Chime  . Hypothyroid   . Dysphagia as late effect of stroke 03/27/2012    Possible component of reflux esophagitis  . Antiphospholipid antibody with hypercoagulable state (HCC) 03/27/2012    Initial presentation 05/2010 with acute left MCA stroke  . Chronic anticoagulation 11/05/2013  . Autoimmune hepatitis [facility])     sees Dr. Daun Peacock   . 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 Tonsillectomy    . Lasik    . Appendectomy    . Cesarean section      3    Social History   Social History  . Marital Status: Married    Spouse Name: N/A  . Number of Children: N/A  . Years of Education: N/A   Occupational History  . Not on file.   Social History Main Topics  . Smoking status: Former Smoker    Types: Cigarettes    Quit date: 06/17/2010  . Smokeless tobacco: Never Used  . Alcohol Use: 0.0 oz/week    0 Standard drinks or equivalent per week     Comment: socially  . Drug Use: No  . Sexual Activity: Not on file   Other Topics Concern  . Not on file   Social History Narrative   Married   Originally from [place]   Avid tennis player    Current Outpatient Prescriptions on File Prior to Visit  Medication Sig Dispense Refill  . aspirin 81 MG tablet Take 1 tablet (81 mg total) by mouth every evening. 30 tablet   .  azaTHIOprine (IMURAN) 50 MG tablet Take 12.5 mg by mouth daily.     . ferrous sulfate 325 (65 FE) MG EC tablet Take 325 mg by mouth daily with breakfast.    . fludrocortisone (FLORINEF) 0.1 MG tablet Take 1 tablet (0.1 mg total) by mouth daily. 30 tablet 11  . predniSONE (DELTASONE) 5 MG tablet Take 1 tablet (5 mg total) by mouth daily with breakfast. 30 tablet 11  . rivaroxaban (XARELTO) 20 MG TABS tablet TAKE 1 TABLET BY MOUTH EVERY DAY AT SUPPER 90 tablet 3  . terbinafine (LAMISIL) 250 MG tablet Take 1 tablet (250 mg total) by mouth daily. 90 tablet 0   No current facility-administered medications on file prior to visit.    No Known Allergies  Family History  Problem Relation Age of Onset  . Arthritis    . Stroke    . Heart disease Father     BP 122/64 mmHg  Pulse 48  Temp(Src) 97.9 F (36.6 C)  Ht  (1.702 m)  Wt 138 lb (62.596 kg)  BMI 21.61 kg/m2  SpO2 98%  LMP 08/24/2015  Review of Systems Skin tone is the same.      Objective:   Physical Exam VITAL SIGNS:  See vs page  GENERAL: no distress Ext:  no edema Gait: normal and steady.    Lab Results  Component Value Date   CREATININE 0.84 09/24/2015   BUN 13 09/24/2015   NA 138 09/24/2015   K 3.9 09/24/2015   CL 105 09/24/2015   CO2 28 09/24/2015   Lab Results  Component Value Date   TSH 7.22* 09/24/2015      Assessment & Plan:  Hypothyroidism: she needs increased rx Addison's dz, clinically improved on rx  Patient is advised the following: Patient Instructions  Please continue the prednisone and fludrocortisone.  You will most likely need these for the rest of your life.   If you have fever, you should take an extra prednisone for thse days.   blood tests are requested for you today.  We'll let you know about the results.   You should sign up for "medic-alert."  You can do so by going to [email or web address], or by calling [phone].   Please come back for a follow-up appointment in 6 months.      addendum: Please continue the same synthroid.

## 2015-09-24 NOTE — Patient Instructions (Addendum)
Please continue the prednisone and fludrocortisone.  You will most likely need these for the rest of your life.   If you have fever, you should take an extra prednisone for thse days.   blood tests are requested for you today.  We'll let you know about the results.   You should sign up for "medic-alert."  You can do so by going to [email or web address], or by calling [phone].   Please come back for a follow-up appointment in 6 months.

## 2015-10-07 ENCOUNTER — Telehealth: Payer: Self-pay | Admitting: Family Medicine

## 2015-10-07 NOTE — Telephone Encounter (Signed)
Yes I can see him  ?

## 2015-10-07 NOTE — Telephone Encounter (Signed)
Pt would like dr fry to accept her 17 yrs son. Can I sch?

## 2015-10-09 NOTE — Telephone Encounter (Signed)
Pt mom is aware 

## 2015-10-15 ENCOUNTER — Other Ambulatory Visit: Payer: Self-pay | Admitting: Oncology

## 2015-11-26 ENCOUNTER — Encounter: Payer: Self-pay | Admitting: Family Medicine

## 2015-11-26 ENCOUNTER — Ambulatory Visit (INDEPENDENT_AMBULATORY_CARE_PROVIDER_SITE_OTHER): Payer: BLUE CROSS/BLUE SHIELD | Admitting: Family Medicine

## 2015-11-26 VITALS — BP 100/62 | Temp 98.5°F | Ht 67.0 in | Wt 140.4 lb

## 2015-11-26 DIAGNOSIS — B351 Tinea unguium: Secondary | ICD-10-CM

## 2015-11-26 MED ORDER — TERBINAFINE HCL 250 MG PO TABS: 250.0000 mg | ORAL_TABLET | Freq: Every day | ORAL | Status: DC

## 2015-11-27 ENCOUNTER — Other Ambulatory Visit: Payer: Self-pay | Admitting: Family Medicine

## 2015-11-28 ENCOUNTER — Encounter: Payer: Self-pay | Admitting: Family Medicine

## 2015-11-28 NOTE — Progress Notes (Signed)
   Subjective:    Patient ID: Natalie Mullen, female    DOB: 02/13/1966, 50 y.o.   MRN: [id number]  HPI Here to follow up on toenail fungus. She recently finished a 90 day course of Terbinafine and she says her nails look much better, but they are not back to normal yet.    Review of Systems  Constitutional: Negative.        Objective:   Physical Exam  Constitutional: She appears well-developed and well-nourished.  Skin:  Minor fungal involvement in 8 out of 10 nails          Assessment & Plan:  Onychomycosis, given an additional 90 days of terbinafine

## 2015-12-21 ENCOUNTER — Encounter: Payer: Self-pay | Admitting: Endocrinology

## 2015-12-22 ENCOUNTER — Other Ambulatory Visit: Payer: Self-pay

## 2015-12-22 MED ORDER — LEVOTHYROXINE SODIUM 100 MCG PO TABS: 100.0000 ug | ORAL_TABLET | Freq: Every day | ORAL | Status: DC

## 2016-03-15 DIAGNOSIS — K754 Autoimmune hepatitis: Secondary | ICD-10-CM | POA: Diagnosis not present

## 2016-03-23 ENCOUNTER — Ambulatory Visit: Payer: BLUE CROSS/BLUE SHIELD | Admitting: Endocrinology

## 2016-03-24 ENCOUNTER — Encounter: Payer: Self-pay | Admitting: Endocrinology

## 2016-03-24 ENCOUNTER — Ambulatory Visit (INDEPENDENT_AMBULATORY_CARE_PROVIDER_SITE_OTHER): Payer: BLUE CROSS/BLUE SHIELD | Admitting: Endocrinology

## 2016-03-24 VITALS — BP 94/62 | HR 50 | Temp 97.4°F | Ht 67.0 in | Wt 142.0 lb

## 2016-03-24 DIAGNOSIS — E039 Hypothyroidism, unspecified: Secondary | ICD-10-CM

## 2016-03-24 DIAGNOSIS — E271 Primary adrenocortical insufficiency: Secondary | ICD-10-CM

## 2016-03-24 LAB — BASIC METABOLIC PANEL
BUN: 13 mg/dL (ref 6–23)
CALCIUM: 9 mg/dL (ref 8.4–10.5)
CO2: 24 meq/L (ref 19–32)
Chloride: 107 mEq/L (ref 96–112)
Creatinine, Ser: 0.78 mg/dL (ref 0.40–1.20)
GFR: 82.41 mL/min (ref >60.00)
GLUCOSE: 92 mg/dL (ref 70–99)
Potassium: 4.2 mEq/L (ref 3.5–5.1)
SODIUM: 137 meq/L (ref 135–145)

## 2016-03-24 LAB — TSH: TSH: 11.69 u[IU]/mL — ABNORMAL HIGH (ref 0.35–4.50)

## 2016-03-24 MED ORDER — PREDNISONE 1 MG PO TABS: 3.0000 mg | ORAL_TABLET | Freq: Every day | ORAL | Status: DC

## 2016-03-24 MED ORDER — LEVOTHYROXINE SODIUM 125 MCG PO TABS: 125.0000 ug | ORAL_TABLET | Freq: Every day | ORAL | Status: DC

## 2016-03-24 NOTE — Progress Notes (Signed)
Subjective:    Patient ID: Natalie Mullen, female    DOB: [birth year], 50 y.o.   MRN: [id number]  HPI Pt returns for fu of Addison's dx (dx'ed 2016, when she presented with diffuse hyperpigmentation).  She is not taking prednisone, as she says it is causing hyperpigmentation.   Hypothyroidism: she takes synthroid as rx'ed.  No weight change Past Medical History  Diagnosis Date  . 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 Anticardiolipin antibody positive     sees Dr. Cyndie Chime  . Hypothyroid   . Dysphagia as late effect of stroke 03/27/2012    Possible component of reflux esophagitis  . Antiphospholipid antibody with hypercoagulable state (HCC) 03/27/2012    Initial presentation 05/2010 with acute left MCA stroke  . Chronic anticoagulation 11/05/2013  . Autoimmune hepatitis [facility])     sees Dr. Daun Peacock   . 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 Tonsillectomy    . Lasik    . Appendectomy    . Cesarean section      3    Social History   Social History  . Marital Status: Married    Spouse Name: N/A  . Number of Children: N/A  . Years of Education: N/A   Occupational History  . Not on file.   Social History Main Topics  . Smoking status: Former Smoker    Types: Cigarettes    Quit date: 06/17/2010  . Smokeless tobacco: Never Used  . Alcohol Use: 0.0 oz/week    0 Standard drinks or equivalent per week     Comment: socially  . Drug Use: No  . Sexual Activity: Not on file   Other Topics Concern  . Not on file   Social History Narrative   Married   Originally from [place]   Avid tennis player    Current Outpatient Prescriptions on File Prior to Visit  Medication Sig Dispense Refill  . aspirin 81 MG tablet Take 1 tablet (81 mg total)  by mouth every evening. 30 tablet   . ferrous sulfate 325 (65 FE) MG EC tablet Take 325 mg by mouth daily with breakfast.    . fludrocortisone (FLORINEF) 0.1 MG tablet Take 1 tablet (0.1 mg total) by mouth daily. 30 tablet 11  . rivaroxaban (XARELTO) 20 MG TABS tablet TAKE 1 TABLET BY MOUTH EVERY DAY AT SUPPER 90 tablet 3  . terbinafine (LAMISIL) 250 MG tablet TAKE 1 TABLET(250 MG) BY MOUTH DAILY 90 tablet 0   No current facility-administered medications on file prior to visit.    No Known Allergies  Family History  Problem Relation Age of Onset  . Arthritis    . Stroke    . Heart disease Father     BP 94/62 mmHg  Pulse 50  Temp(Src) 97.4 F (36.3 C) (Oral)  Ht  (1.702 m)  Wt 142 lb (64.411 kg)  BMI 22.24 kg/m2  SpO2 98%  Review of Systems Denies dizziness, diarrhea, and LOC.      Objective:   Physical Exam VITAL SIGNS:  See vs page GENERAL: no distress NECK: There is no palpable thyroid enlargement.  No thyroid nodule is palpable.  No palpable lymphadenopathy at the anterior neck. Skin: diffusely hyperpigmented.     Lab Results  Component Value Date   TSH 11.69* 03/24/2016       Assessment & Plan:  Hypothyroidism: she needs increased rx Adrenal insuff: therapy is limited by perceived drug intolerance.  Patient is advised the following: Patient Instructions  If you have fever, you should take an extra prednisone for thse days.   blood tests are requested for you today.  We'll let you know about the results.   i have sent a prescription to your pharmacy, to try prednisone at just 3 mg daily.  This pill helps minimize the skin darkening, so please try your best to take it.   Please continue the prednisone and fludrocortisone.  You will most likely need these for the rest of your life.  Please come back for a follow-up appointment in 6 months.

## 2016-03-24 NOTE — Patient Instructions (Addendum)
If you have fever, you should take an extra prednisone for thse days.   blood tests are requested for you today.  We'll let you know about the results.   i have sent a prescription to your pharmacy, to try prednisone at just 3 mg daily.  This pill helps minimize the skin darkening, so please try your best to take it.   Please continue the prednisone and fludrocortisone.  You will most likely need these for the rest of your life.  Please come back for a follow-up appointment in 6 months.

## 2016-05-10 ENCOUNTER — Telehealth: Payer: Self-pay | Admitting: Endocrinology

## 2016-05-10 NOTE — Telephone Encounter (Signed)
Lab orders submitted.

## 2016-05-10 NOTE — Telephone Encounter (Signed)
Please send order to [facility], patient is there now to get labs drawn, Fax # [phone]

## 2016-05-13 ENCOUNTER — Other Ambulatory Visit: Payer: Self-pay | Admitting: Endocrinology

## 2016-05-13 DIAGNOSIS — E039 Hypothyroidism, unspecified: Secondary | ICD-10-CM | POA: Diagnosis not present

## 2016-05-14 LAB — TSH: TSH: 17.14 u[IU]/mL — ABNORMAL HIGH (ref 0.450–4.500)

## 2016-05-18 IMAGING — DX DG CHEST 2V
2 series · 2 of 2 positions shown · non-contrast
Comparison: None.

CLINICAL DATA: Trauma secondary to falling down 14 wooden stairs at
home today.

EXAM:
CHEST  2 VIEW

[chest pa]
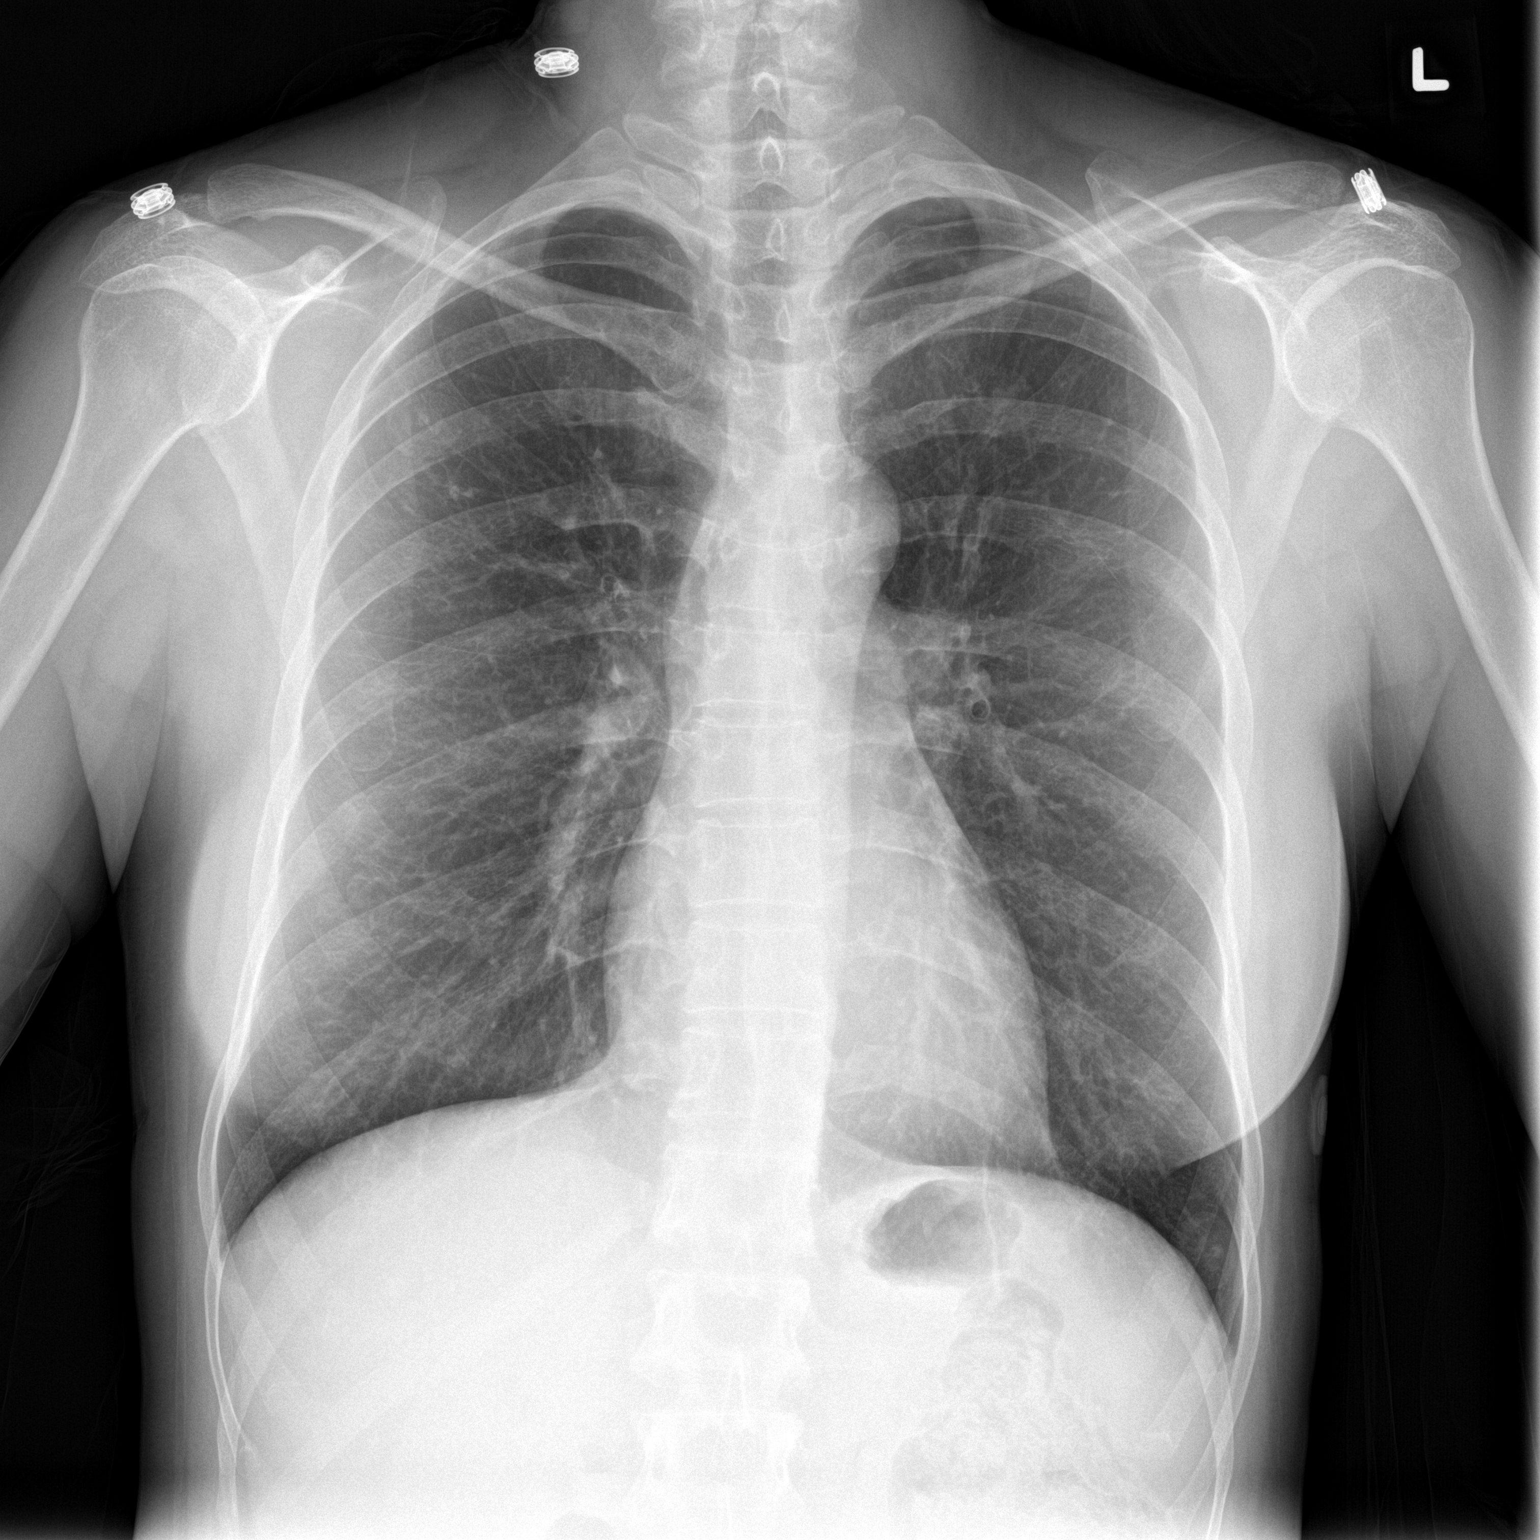

[chest lat]
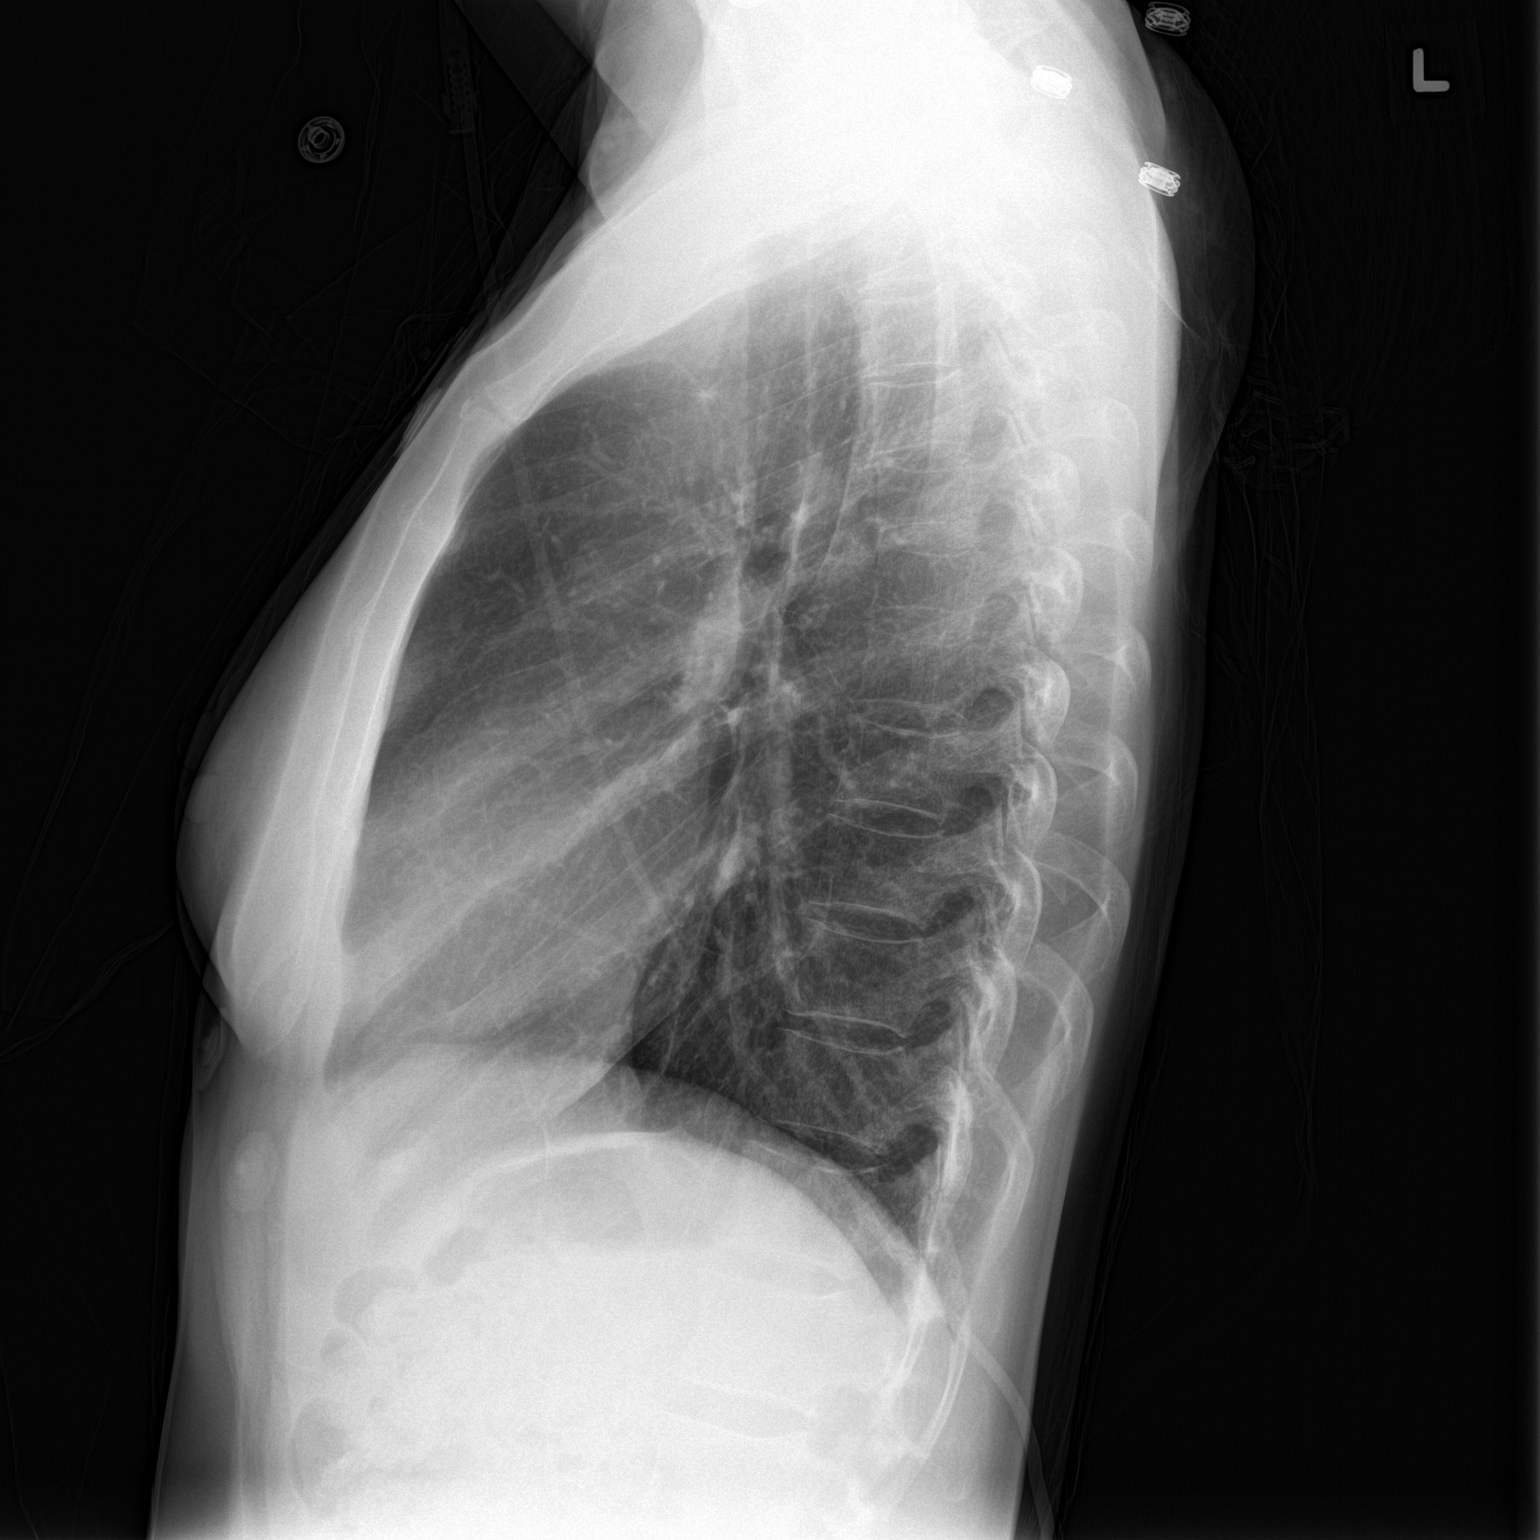

[2 of 2 positions shown; findings below may reference images not displayed]

FINDINGS: The heart size and mediastinal contours are within normal limits.
Both lungs are clear. The visualized skeletal structures are
unremarkable.
IMPRESSION: Normal chest.

## 2016-06-09 ENCOUNTER — Encounter: Payer: Self-pay | Admitting: Family Medicine

## 2016-06-09 ENCOUNTER — Ambulatory Visit (INDEPENDENT_AMBULATORY_CARE_PROVIDER_SITE_OTHER): Payer: BLUE CROSS/BLUE SHIELD | Admitting: Family Medicine

## 2016-06-09 VITALS — BP 99/62 | HR 50 | Temp 97.7°F | Ht 67.0 in | Wt 138.0 lb

## 2016-06-09 DIAGNOSIS — J309 Allergic rhinitis, unspecified: Secondary | ICD-10-CM | POA: Diagnosis not present

## 2016-06-09 DIAGNOSIS — N390 Urinary tract infection, site not specified: Secondary | ICD-10-CM

## 2016-06-09 LAB — POC URINALSYSI DIPSTICK (AUTOMATED)
BILIRUBIN UA: NEGATIVE
Glucose, UA: NEGATIVE
KETONES UA: NEGATIVE
Leukocytes, UA: NEGATIVE
Nitrite, UA: NEGATIVE
PH UA: 6
Protein, UA: NEGATIVE
RBC UA: NEGATIVE
Urobilinogen, UA: 0.2

## 2016-06-09 MED ORDER — TERBINAFINE HCL 250 MG PO TABS: ORAL_TABLET | ORAL | Status: DC

## 2016-06-09 MED ORDER — CIPROFLOXACIN HCL 500 MG PO TABS: 500.0000 mg | ORAL_TABLET | Freq: Two times a day (BID) | ORAL | Status: DC

## 2016-06-09 MED ORDER — FLUTICASONE PROPIONATE 50 MCG/ACT NA SUSP: 2.0000 | Freq: Every day | NASAL | Status: DC

## 2016-06-09 NOTE — Addendum Note (Signed)
Addended by: Aniceto BossNIMMONS, SYLVIA A on: 06/09/2016 12:05 PM   Modules accepted: Orders

## 2016-06-09 NOTE — Progress Notes (Signed)
Pre visit review using our clinic review tool, if applicable. No additional management support is needed unless otherwise documented below in the visit note. 

## 2016-06-09 NOTE — Progress Notes (Signed)
   Subjective:    Patient ID: Natalie Mullen, female    DOB: [birth year], 50 y.o.   MRN: [id number]  HPI Here for 2 things. First she has had some burning and urgency to urinate for 2 days. No fever or back pain. Also she has frequent nasal congestion but she does not feel ill.    Review of Systems  Constitutional: Negative.   HENT: Positive for congestion. Negative for postnasal drip, sinus pressure and sore throat.   Genitourinary: Positive for dysuria, urgency and frequency. Negative for hematuria and flank pain.       Objective:   Physical Exam  Constitutional: She appears well-developed and well-nourished.  Abdominal: Soft. Bowel sounds are normal. She exhibits no distension and no mass. There is no tenderness. There is no rebound and no guarding.  Neurological: She is alert.          Assessment & Plan:  Uti, treat with Cipro and rink plenty of water. For the nasal congestion, try Flonase daily.  Nelwyn SalisburyFRY,Faduma Cho A, MD

## 2016-06-11 DIAGNOSIS — Z1231 Encounter for screening mammogram for malignant neoplasm of breast: Secondary | ICD-10-CM | POA: Diagnosis not present

## 2016-06-18 DIAGNOSIS — R921 Mammographic calcification found on diagnostic imaging of breast: Secondary | ICD-10-CM | POA: Diagnosis not present

## 2016-07-06 ENCOUNTER — Encounter: Payer: Self-pay | Admitting: Family Medicine

## 2016-07-06 ENCOUNTER — Ambulatory Visit (INDEPENDENT_AMBULATORY_CARE_PROVIDER_SITE_OTHER): Payer: BLUE CROSS/BLUE SHIELD | Admitting: Family Medicine

## 2016-07-06 VITALS — BP 90/50 | HR 66 | Temp 97.9°F | Ht 67.0 in | Wt 137.3 lb

## 2016-07-06 DIAGNOSIS — R300 Dysuria: Secondary | ICD-10-CM

## 2016-07-06 LAB — POCT URINALYSIS DIPSTICK
Bilirubin, UA: NEGATIVE
GLUCOSE UA: NEGATIVE
KETONES UA: NEGATIVE
Leukocytes, UA: NEGATIVE
Nitrite, UA: NEGATIVE
PROTEIN UA: NEGATIVE
RBC UA: NEGATIVE
UROBILINOGEN UA: 0.2
pH, UA: 6

## 2016-07-06 NOTE — Progress Notes (Signed)
HPI:  Acute visit for Dysuria: -started yesterday -symptoms include burning with urination and urgency -denies: fevers, malaise, hematuria, nv, abd or flank pain, vaginal discharge or discomfort -reported hx UTI -FDLMP Jun 22, 2016; seeing gyn for regular exam later this month  ROS: See pertinent positives and negatives per HPI.  Past Medical History:  Diagnosis Date  . 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 Anticardiolipin antibody positive    sees Dr. Cyndie ChimeGranfortuna  . Antiphospholipid antibody with hypercoagulable state (HCC) 03/27/2012   Initial presentation 05/2010 with acute left MCA stroke  . Autoimmune hepatitis [facility](HCC)    sees Dr. Daun PeacockPaul Ayashi   . Chronic anticoagulation 11/05/2013  . Dysphagia as late effect of stroke 03/27/2012   Possible component of reflux esophagitis  . Hypothyroid   . Stroke 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 APPENDECTOMY    . CESAREAN SECTION     3  . LASIK    . TONSILLECTOMY      Family History  Problem Relation Age of Onset  . Arthritis    . Stroke    . Heart disease Father     Social History   Social History  . Marital status: Married    Spouse name: N/A  . Number of children: N/A  . Years of education: N/A   Social History Main Topics  . Smoking status: Former Smoker    Types: Cigarettes    Quit date: 06/17/2010  . Smokeless tobacco: Never Used  . Alcohol use 0.0 oz/week     Comment: socially  . Drug use: No  . Sexual activity: Not Asked   Other Topics Concern  . None   Social History Narrative   Married   [place]   Avid tennis player     Current Outpatient Prescriptions:  .  aspirin 81 MG tablet, Take 1 tablet (81 mg total) by mouth every evening., Disp: 30 tablet, Rfl:  .   ferrous sulfate 325 (65 FE) MG EC tablet, Take 325 mg by mouth daily with breakfast., Disp: , Rfl:  .  fludrocortisone (FLORINEF) 0.1 MG tablet, Take 1 tablet (0.1 mg total) by mouth daily., Disp: 30 tablet, Rfl: 11 .  levothyroxine (SYNTHROID, LEVOTHROID) 125 MCG tablet, Take 1 tablet (125 mcg total) by mouth daily before breakfast., Disp: 30 tablet, Rfl: 5 .  predniSONE (DELTASONE) 1 MG tablet, Take 3 tablets (3 mg total) by mouth daily with breakfast., Disp: 100 tablet, Rfl: 11 .  rivaroxaban (XARELTO) 20 MG TABS tablet, TAKE 1 TABLET BY MOUTH EVERY DAY AT SUPPER, Disp: 90 tablet, Rfl: 3 .  terbinafine (LAMISIL) 250 MG tablet, TAKE 1 TABLET(250 MG) BY MOUTH DAILY, Disp: 90 tablet, Rfl: 1  EXAM:  Vitals:   07/06/16 1119  BP: (!) 90/50  Pulse: 66  Temp: 97.9 F (36.6 C)    Body mass index is 21.5 kg/m.  GENERAL: vitals reviewed and listed above, alert, oriented, appears well hydrated and in no acute distress  HEENT: atraumatic, conjunttiva clear, no obvious abnormalities on inspection of external nose and ears  NECK: no obvious masses on inspection  LUNGS: clear to auscultation bilaterally, no wheezes, rales or rhonchi, good air movement  CV: HRRR, no peripheral edema  ABD: BS+, soft, NTTP, no CVA TTP  MS:  moves all extremities without noticeable abnormality  PSYCH: pleasant and cooperative, no obvious depression or anxiety  ASSESSMENT AND PLAN:  Discussed the following assessment and plan:  Dysuria  -udip normal, culture pending -advised eval with gyn at her upcoming visit if culture neg and persistent symptoms -Patient advised to return or notify a doctor immediately if symptoms worsen or persist or new concerns arise.  There are no Patient Instructions on file for this visit.  Kriste BasqueKIM, Sreekar Broyhill R., DO

## 2016-07-06 NOTE — Progress Notes (Signed)
Pre visit review using our clinic review tool, if applicable. No additional management support is needed unless otherwise documented below in the visit note. 

## 2016-07-07 LAB — URINE CULTURE

## 2016-07-16 DIAGNOSIS — N951 Menopausal and female climacteric states: Secondary | ICD-10-CM | POA: Diagnosis not present

## 2016-07-16 DIAGNOSIS — Z01419 Encounter for gynecological examination (general) (routine) without abnormal findings: Secondary | ICD-10-CM | POA: Diagnosis not present

## 2016-07-16 DIAGNOSIS — Z6822 Body mass index (BMI) 22.0-22.9, adult: Secondary | ICD-10-CM | POA: Diagnosis not present

## 2016-07-16 DIAGNOSIS — Z780 Asymptomatic menopausal state: Secondary | ICD-10-CM | POA: Diagnosis not present

## 2016-08-18 DIAGNOSIS — H5213 Myopia, bilateral: Secondary | ICD-10-CM | POA: Diagnosis not present

## 2016-09-24 ENCOUNTER — Ambulatory Visit (INDEPENDENT_AMBULATORY_CARE_PROVIDER_SITE_OTHER): Payer: BLUE CROSS/BLUE SHIELD | Admitting: Endocrinology

## 2016-09-24 ENCOUNTER — Encounter: Payer: Self-pay | Admitting: Endocrinology

## 2016-09-24 VITALS — BP 104/64 | HR 54 | Ht 67.0 in | Wt 140.0 lb

## 2016-09-24 DIAGNOSIS — R29810 Facial weakness: Secondary | ICD-10-CM

## 2016-09-24 DIAGNOSIS — E271 Primary adrenocortical insufficiency: Secondary | ICD-10-CM

## 2016-09-24 DIAGNOSIS — E039 Hypothyroidism, unspecified: Secondary | ICD-10-CM | POA: Diagnosis not present

## 2016-09-24 LAB — BASIC METABOLIC PANEL
BUN: 15 mg/dL (ref 6–23)
CHLORIDE: 106 meq/L (ref 96–112)
CO2: 29 meq/L (ref 19–32)
CREATININE: 0.9 mg/dL (ref 0.40–1.20)
Calcium: 9.7 mg/dL (ref 8.4–10.5)
GFR: 69.73 mL/min (ref >60.00)
Glucose, Bld: 88 mg/dL (ref 70–99)
POTASSIUM: 4 meq/L (ref 3.5–5.1)
SODIUM: 140 meq/L (ref 135–145)

## 2016-09-24 LAB — VITAMIN B12: VITAMIN B 12: 196 pg/mL — AB (ref 211–911)

## 2016-09-24 LAB — TSH: TSH: 17.05 u[IU]/mL — AB (ref 0.35–4.50)

## 2016-09-24 MED ORDER — LEVOTHYROXINE SODIUM 150 MCG PO TABS: 150.0000 ug | ORAL_TABLET | Freq: Every day | ORAL | 3 refills | Status: DC

## 2016-09-24 NOTE — Progress Notes (Signed)
Subjective:    Patient ID: Natalie Mullen, female    DOB: 1965-12-28, 50 y.o.   MRN: [id number]  HPI Pt returns for fu of Addison's Dz (dx'ed 2016, when she presented with diffuse hyperpigmentation).  She takes prednisone as rx'ed Hypothyroidism: she takes synthroid as rx'ed.  She has gained a few lbs.   She has slight weakness of the eyelids, (R>L), but no assoc numbness of the feet. Past Medical History:  Diagnosis Date  . 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 Anticardiolipin antibody positive    sees Dr. Cyndie ChimeGranfortuna  . Antiphospholipid antibody with hypercoagulable state (HCC) 03/27/2012   Initial presentation 05/2010 with acute left MCA stroke  . Autoimmune hepatitis [facility](HCC)    sees Dr. Daun PeacockPaul Ayashi   . Chronic anticoagulation 11/05/2013  . Dysphagia as late effect of stroke 03/27/2012   Possible component of reflux esophagitis  . Hypothyroid   . 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 APPENDECTOMY    . CESAREAN SECTION     3  . LASIK    . TONSILLECTOMY      Social History   Social History  . Marital status: Married    Spouse name: N/A  . Number of children: N/A  . Years of education: N/A   Occupational History  . Not on file.   Social History Main Topics  . Smoking status: Former Smoker    Types: Cigarettes    Quit date: 06/17/2010  . Smokeless tobacco: Never Used  . Alcohol use 0.0 oz/week     Comment: socially  . Drug use: No  . Sexual activity: Not on file   Other Topics Concern  . Not on file   Social History Narrative   Married   Originally [place]   Avid tennis player    Current Outpatient Prescriptions on File Prior to Visit  Medication Sig Dispense Refill  . aspirin 81 MG tablet Take 1 tablet (81 mg total) by  mouth every evening. 30 tablet   . ferrous sulfate 325 (65 FE) MG EC tablet Take 325 mg by mouth daily with breakfast.    . fludrocortisone (FLORINEF) 0.1 MG tablet Take 1 tablet (0.1 mg total) by mouth daily. 30 tablet 11  . predniSONE (DELTASONE) 1 MG tablet Take 3 tablets (3 mg total) by mouth daily with breakfast. 100 tablet 11  . rivaroxaban (XARELTO) 20 MG TABS tablet TAKE 1 TABLET BY MOUTH EVERY DAY AT SUPPER 90 tablet 3  . terbinafine (LAMISIL) 250 MG tablet TAKE 1 TABLET(250 MG) BY MOUTH DAILY 90 tablet 1   No current facility-administered medications on file prior to visit.     No Known Allergies  Family History  Problem Relation Age of Onset  . Arthritis    . Stroke    . Heart disease Father     BP 104/64   Pulse (!) 54   Ht 5\' 7"  (1.702 m)   Wt 140 lb (63.5 kg)   SpO2 97%   BMI 21.93 kg/m   Review of Systems She does not have menses.  She has hot flashes.      Objective:   Physical Exam VITAL SIGNS:  See vs page GENERAL: no distress Head: right eyelid is lower than the left. NECK: There is  no palpable thyroid enlargement.  No thyroid nodule is palpable.  No palpable lymphadenopathy at the anterior neck. Skin: diffusely hyperpigmented.  Ext: no edema.    Lab Results  Component Value Date   TSH 17.05 (H) 09/24/2016   Lab Results  Component Value Date   CREATININE 0.90 09/24/2016   BUN 15 09/24/2016   NA 140 09/24/2016   K 4.0 09/24/2016   CL 106 09/24/2016   CO2 29 09/24/2016   B-12=low    Assessment & Plan:  B-12 deficiency, new. I advised injections Hypothyroidism: she needs increased rx.  I have sent a prescription to your pharmacy.  Recheck 1 month.  Addison's Dz: well-replaced Eyelid weakness: ? MG.  Check antibodies, but I told pt these are not necessarily pos in MG.  Ref neurol.  Please come back for a follow-up appointment in 6 months.

## 2016-09-24 NOTE — Patient Instructions (Addendum)
If you have fever, you should take an extra prednisone for thse days.   blood tests are requested for you today.  We'll let you know about the results.   Please continue the prednisone and fludrocortisone.  You will most likely need these for the rest of your life.  Please see a neurology specialist, to see if you have a condition called "Myasthenia Gravis."  you will receive a phone call, about a day and time for an appointment.   Please come back for a follow-up appointment in 6 months.

## 2016-09-27 ENCOUNTER — Encounter: Payer: Self-pay | Admitting: Endocrinology

## 2016-09-28 ENCOUNTER — Other Ambulatory Visit: Payer: Self-pay | Admitting: Endocrinology

## 2016-09-28 MED ORDER — CYANOCOBALAMIN 500 MCG/0.1ML NA SOLN: 0.1000 mL | NASAL | 11 refills | Status: DC

## 2016-09-30 LAB — ACETYLCHOLINE RECEPTOR AB, ALL
AChR Binding Ab, Serum: 2.13 nmol/L — ABNORMAL HIGH (ref 0.00–0.24)
Acetylchol Block Ab: 30 % — ABNORMAL HIGH (ref 0–25)

## 2016-10-08 ENCOUNTER — Encounter: Payer: Self-pay | Admitting: Endocrinology

## 2016-10-08 DIAGNOSIS — E538 Deficiency of other specified B group vitamins: Secondary | ICD-10-CM | POA: Insufficient documentation

## 2016-10-08 DIAGNOSIS — Z7689 Persons encountering health services in other specified circumstances: Secondary | ICD-10-CM

## 2016-10-18 ENCOUNTER — Other Ambulatory Visit: Payer: Self-pay | Admitting: Endocrinology

## 2016-11-08 ENCOUNTER — Ambulatory Visit (INDEPENDENT_AMBULATORY_CARE_PROVIDER_SITE_OTHER): Payer: BLUE CROSS/BLUE SHIELD | Admitting: Neurology

## 2016-11-08 ENCOUNTER — Encounter: Payer: Self-pay | Admitting: Neurology

## 2016-11-08 VITALS — BP 96/61 | HR 60 | Ht 67.0 in | Wt 142.0 lb

## 2016-11-08 DIAGNOSIS — E271 Primary adrenocortical insufficiency: Secondary | ICD-10-CM

## 2016-11-08 DIAGNOSIS — K754 Autoimmune hepatitis: Secondary | ICD-10-CM | POA: Diagnosis not present

## 2016-11-08 DIAGNOSIS — I639 Cerebral infarction, unspecified: Secondary | ICD-10-CM

## 2016-11-08 DIAGNOSIS — L819 Disorder of pigmentation, unspecified: Secondary | ICD-10-CM | POA: Diagnosis not present

## 2016-11-08 DIAGNOSIS — G7000 Myasthenia gravis without (acute) exacerbation: Secondary | ICD-10-CM | POA: Diagnosis not present

## 2016-11-08 DIAGNOSIS — E039 Hypothyroidism, unspecified: Secondary | ICD-10-CM | POA: Diagnosis not present

## 2016-11-08 NOTE — Progress Notes (Signed)
PATIENT: Natalie Mullen DOB: Jun 29, 1966  Chief Complaint  Patient presents with  . Facial Weakness    Reports right-sided facial weakness since having a stroke in 2011.   . Endocrinologist    Romero BellingSean Ellison, MD - referring MD  . PCP    Nelwyn SalisburyStephen A Fry, MD     HISTORICAL  Natalie PionLuisa Marconi is ambidextrous female, seen in refer by her endocrinologist Romero BellingSean Ellison for evaluation of right ptosis, her primary care physician is Dr. Gershon CraneStephen Fry, initial evaluation is on November 08 2016.  She had a history of hypothyroidism, on supplement, Addison's disease due to autoimmune disease, autoimmune hepatitis, also had positive anticardiolipin and antiphospholipid antibody, hypercoagulation, on chronic Xalreto treatment, she suffered stroke in 2011, was treated at [facility] [place].  When she suffered stroke in 2011, she presented with confusion, dysarthria, was diagnosed with left MCA stroke, she had right arm weakness, right ptosis then, but when she play tennis, she noticed mild right leg weakness.  She was planning to have right blepharoplasty, hope to clear other medical issues before proceeding. She denies double vision, no dysarthria no dysphagia, no limb muscle weakness, no variable ptosis noticed. She always has persistent mild right ptosis, does not block vision,    We have personally reviewed CT scan in 2015, chronic left MCA stroke affecting left basal ganglion periventricular white matter, breaking into the left lateral ventricle.  Laboratory evaluation showed low vitamin B12 196, elevated TSH 17, positive acetylcholine binding antibody with titer of 2.13, positive acetylcholine blocking antibody, normal BMP, normal liver functional tests CBC in April 2017   REVIEW OF SYSTEMS: Full 14 system review of systems performed and notable only for ptosis  ALLERGIES: No Known Allergies  HOME MEDICATIONS: Current Outpatient Prescriptions  Medication Sig Dispense Refill  . ferrous sulfate 325 (65 FE) MG  EC tablet Take 325 mg by mouth daily with breakfast.    . fludrocortisone (FLORINEF) 0.1 MG tablet TAKE 1 TABLET(0.1 MG) BY MOUTH DAILY 30 tablet 0  . levothyroxine (SYNTHROID, LEVOTHROID) 150 MCG tablet Take 1 tablet (150 mcg total) by mouth daily. 30 tablet 3  . rivaroxaban (XARELTO) 20 MG TABS tablet TAKE 1 TABLET BY MOUTH EVERY DAY AT SUPPER 90 tablet 3   No current facility-administered medications for this visit.     PAST MEDICAL HISTORY: Past Medical History:  Diagnosis Date  . 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 Anticardiolipin antibody positive    sees Dr. Cyndie ChimeGranfortuna  . Antiphospholipid antibody with hypercoagulable state (HCC) 03/27/2012   Initial presentation 05/2010 with acute left MCA stroke  . Autoimmune hepatitis [facility](HCC)    sees Dr. Daun PeacockPaul Ayashi   . Chronic anticoagulation 11/05/2013  . Dysphagia as late effect of stroke 03/27/2012   Possible component of reflux esophagitis  . Hypothyroid   . 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 APPENDECTOMY    . CESAREAN SECTION     3  . LASIK    . TONSILLECTOMY      FAMILY HISTORY: Family History  Problem Relation Age of Onset  . Heart disease Father   . Arthritis    . Stroke    . Other Mother     Accident - had a fall    SOCIAL HISTORY:  Social  History   Social History  . Marital status: Divorced    Spouse name: N/A  . Number of children: 3  . Years of education: Masters   Occupational History  . House builder - [employer]    Social History Main Topics  . Smoking status: Former Smoker    Types: Cigarettes    Quit date: 06/17/2010  . Smokeless tobacco: Never Used  . Alcohol use No  . Drug use: No  . Sexual activity: Not on file   Other Topics Concern  . Not on  file   Social History Narrative   Lives at home alone.   Originally from [place]   Avid tennis player   Occasional caffeine use.     PHYSICAL EXAM   Vitals:   11/08/16 1017  BP: 96/61  Pulse: 60  Weight: 142 lb (64.4 kg)  Height: 5\' 7"  (1.702 m)    Not recorded      Body mass index is 22.24 kg/m.  PHYSICAL EXAMNIATION:  Gen: NAD, conversant, well nourised, obese, well groomed                     Cardiovascular: Regular rate rhythm, no peripheral edema, warm, nontender. Eyes: Conjunctivae clear without exudates or hemorrhage Neck: Supple, no carotid bruits. Pulmonary: Clear to auscultation bilaterally   NEUROLOGICAL EXAM:  MENTAL STATUS: Speech:    Speech is normal; fluent and spontaneous with normal comprehension.  Cognition:     Orientation to time, place and person     Normal recent and remote memory     Normal Attention span and concentration     Normal Language, naming, repeating,spontaneous speech     Fund of knowledge   CRANIAL NERVES: CN II: Visual fields are full to confrontation. Fundoscopic exam is normal with sharp discs and no vascular changes. Pupils are round equal and briskly reactive to light. CN III, IV, VI: extraocular movement are normal. No ptosis. CN V: Facial sensation is intact to pinprick in all 3 divisions bilaterally. Corneal responses are intact.  CN VII: Face is symmetric with normal eye closure and smile.She has mild fatigable right ptosis, no facial muscle weakness notice.  CN VIII: Hearing is normal to rubbing fingers CN IX, X: Palate elevates symmetrically. Phonation is normal. CN XI: Head turning and shoulder shrug are intact CN XII: Tongue is midline with normal movements and no atrophy.  MOTOR: There is no pronator drift of out-stretched arms. Muscle bulk and tone are normal. Muscle strength is normal.  REFLEXES: Reflexes are 2+ and symmetric at the biceps, triceps, knees, and ankles. Plantar responses are  flexor.  SENSORY: Intact to light touch, pinprick, positional sensation and vibratory sensation are intact in fingers and toes.  COORDINATION: Rapid alternating movements and fine finger movements are intact. There is no dysmetria on finger-to-nose and heel-knee-shin.    GAIT/STANCE: Posture is normal. Gait is steady with normal steps, base, arm swing, and turning. Heel and toe walking are normal. Tandem gait is normal.  Romberg is absent.   DIAGNOSTIC DATA (LABS, IMAGING, TESTING) - I reviewed patient records, labs, notes, testing and imaging myself where available.   ASSESSMENT AND PLAN  Karena Kinker is a 50 y.o. female   Fatigable right ptosis  Positive acetylcholine receptor binding and blocking antibody consistent with diagnosis of myasthenia gravis, likely limited to all the myasthenia gravis  Complete evaluation with MRI of the brain  CT chest without contrast  Repeat laboratory, including acetylcholine receptor  antibodies,  Return to clinic in one month   I have suggested her not to proceed with right blepharoplasty,   Levert FeinsteinYijun Lebert Lovern, M.D. Ph.D.  [facility] [address], [address] [place], [place] [address] Ph: [phone] Fax: [phone]  CC: Romero BellingSean Ellison, MD

## 2016-11-08 NOTE — Patient Instructions (Signed)
Myasthenia Gravis Introduction Myasthenia gravis (MG) means severe weakness. It is a long-term (chronic) condition that causes weakness in the muscles you can control (voluntary muscles). MG can affect any voluntary muscle. The muscles most often affected are the ones that control:  Eye movement.  Facial movements.  Swallowing. MG is an autoimmune disease, which means that your body's defense system (immune system) attacks healthy parts of your body instead of germs and other things that make you sick. When you have MG, your immune system makes proteins (antibodies) that block the chemical (acetylcholine) your body needs to send nerve signals to your muscles. This causes muscle weakness. What are the causes? The exact cause of MG is unknown. One possible cause is an enlarged thymus gland, which is located under your breastbone. What are the signs or symptoms? The earliest symptom of MG is muscle weakness that gets worse with activity and gets better after rest. Other symptoms of MG may include:  Drooping eyelids.  Double vision.  Loss of facial expression.  Trouble chewing and swallowing.  Slurred speech.  A waddling walk.  Weakness of the arms, hands, and legs. Trouble breathing is the most dangerous symptom of MG. Sudden and severe difficulty breathing (myasthenic crisis) may require emergency breathing support. This symptom sometimes happens after:  Infection.  Fever.  Drug reaction. How is this diagnosed? It can be hard to diagnose MG because muscle weakness is a common symptom in many conditions. Your health care provider will do a physical exam. You may also have tests that will help make a diagnosis. These may include:  A blood test.  A test using the medicine edrophonium. This medicine increases muscle strength by slowing the breakdown of acetylcholine.  Tests to measure nerve conduction to muscle (electromyography).  An imaging study of the chest (CT or MRI). How  is this treated? Treatment can improve muscle strength. Sometimes symptoms of MG go away for a while (remission) and you can stop treatment. Possible treatments include:  Medicine.  Removal of the thymus gland (thymectomy). This may result in a long remission for some people. Follow these instructions at home:  Take medicines only as directed by your health care provider.  Get plenty of rest to conserve your energy.  Take frequent breaks to rest your eyes.  Maintain a healthy diet and a healthy weight.  Do not use any tobacco products including cigarettes, chewing tobacco, or electronic cigarettes. If you need help quitting, ask your health care provider.  Keep all follow-up visits as directed by your health care provider. This is important. Contact a health care provider if:  Your symptoms get worse after a fever or infection.  You have a reaction to a medicine you are taking.  Your symptoms change or get worse. Get help right away if: You have trouble breathing. This information is not intended to replace advice given to you by your health care provider. Make sure you discuss any questions you have with your health care provider. Document Released: 02/14/2001 Document Revised: 04/15/2016 Document Reviewed: 01/09/2014  2017 Elsevier

## 2016-11-15 ENCOUNTER — Other Ambulatory Visit: Payer: Self-pay | Admitting: Endocrinology

## 2016-11-19 ENCOUNTER — Telehealth: Payer: Self-pay | Admitting: Neurology

## 2016-11-19 NOTE — Telephone Encounter (Signed)
This is a Dr. Terrace ArabiaYan patient BCBS did not approve the CT Chest wo contrast. It did approve the MRI Brain wo contrast but I was unable to get the authorization code due to both of the test are linked together. I spoke with the [facility] [employer] nurse and she informed me it needed more clinical information. The phone number for the peer to peer is [phone], the member ID is [id number] and DOB is 2066-03-02. Please do in 3 business days. The patient does [facility]/16.

## 2016-11-19 NOTE — Telephone Encounter (Signed)
I called for peer-peer review.  CT chest approved: [id number]  MRI brain approved: [id number]   Thanks,  Suanne MarkerVIKRAM R. Alexi Geibel, MD 11/19/2016, 2:59 PM Certified in Neurology, Neurophysiology and Neuroimaging  [facility] [address], [address] [place], [place] [address] [phone]

## 2016-11-24 NOTE — Telephone Encounter (Signed)
Noted, thank you for your help!  °

## 2016-12-03 ENCOUNTER — Ambulatory Visit
Admission: RE | Admit: 2016-12-03 | Discharge: 2016-12-03 | Disposition: A | Discharge status: Home / Self Care | Payer: BLUE CROSS/BLUE SHIELD | Source: Ambulatory Visit | Attending: Neurology | Admitting: Neurology

## 2016-12-03 DIAGNOSIS — E271 Primary adrenocortical insufficiency: Secondary | ICD-10-CM

## 2016-12-03 DIAGNOSIS — I639 Cerebral infarction, unspecified: Secondary | ICD-10-CM | POA: Diagnosis not present

## 2016-12-03 DIAGNOSIS — R918 Other nonspecific abnormal finding of lung field: Secondary | ICD-10-CM | POA: Diagnosis not present

## 2016-12-03 DIAGNOSIS — G7000 Myasthenia gravis without (acute) exacerbation: Secondary | ICD-10-CM

## 2016-12-03 DIAGNOSIS — K754 Autoimmune hepatitis: Secondary | ICD-10-CM

## 2016-12-03 DIAGNOSIS — E039 Hypothyroidism, unspecified: Secondary | ICD-10-CM

## 2016-12-03 DIAGNOSIS — L819 Disorder of pigmentation, unspecified: Secondary | ICD-10-CM

## 2016-12-06 ENCOUNTER — Telehealth: Payer: Self-pay | Admitting: Neurology

## 2016-12-06 DIAGNOSIS — N6320 Unspecified lump in the left breast, unspecified quadrant: Secondary | ICD-10-CM

## 2016-12-06 NOTE — Telephone Encounter (Signed)
I have called patient, CT of the chest showed left breast mass, 4 times 4 cm,  I will refer [place] to [place] neuro[place] urgently for evaluation  MRI of the brain showed chronic left basal ganglion, internal capsule hemorrhagic stroke, no acute abnormality

## 2016-12-17 DIAGNOSIS — N6320 Unspecified lump in the left breast, unspecified quadrant: Secondary | ICD-10-CM | POA: Diagnosis not present

## 2016-12-20 ENCOUNTER — Ambulatory Visit: Payer: BLUE CROSS/BLUE SHIELD | Admitting: Neurology

## 2016-12-31 ENCOUNTER — Telehealth: Payer: Self-pay | Admitting: *Deleted

## 2016-12-31 NOTE — Telephone Encounter (Signed)
[facility] and [facility] - pt is having breast bx on 01/06/17 @ [facility]. Need to know if Xarelto needs to be held prior to procedure.   Talked to [place] who said Xarelto does not need to be held prior to bx.  Dr Patsy LagerGranfortuna's response faxed back to Robin at [facility] @ [phone].

## 2017-01-06 DIAGNOSIS — N6459 Other signs and symptoms in breast: Secondary | ICD-10-CM | POA: Diagnosis not present

## 2017-01-24 ENCOUNTER — Ambulatory Visit: Payer: BLUE CROSS/BLUE SHIELD | Admitting: Neurology

## 2017-01-30 ENCOUNTER — Other Ambulatory Visit: Payer: Self-pay | Admitting: Endocrinology

## 2017-02-21 ENCOUNTER — Encounter: Payer: Self-pay | Admitting: Family Medicine

## 2017-02-21 ENCOUNTER — Ambulatory Visit (INDEPENDENT_AMBULATORY_CARE_PROVIDER_SITE_OTHER): Payer: BLUE CROSS/BLUE SHIELD | Admitting: Family Medicine

## 2017-02-21 VITALS — BP 96/65 | HR 56 | Temp 98.3°F | Ht 67.0 in | Wt 141.0 lb

## 2017-02-21 DIAGNOSIS — Z0000 Encounter for general adult medical examination without abnormal findings: Secondary | ICD-10-CM

## 2017-02-21 LAB — POC URINALSYSI DIPSTICK (AUTOMATED)
Bilirubin, UA: NEGATIVE
Blood, UA: NEGATIVE
Glucose, UA: NEGATIVE
KETONES UA: NEGATIVE
LEUKOCYTES UA: NEGATIVE
Nitrite, UA: NEGATIVE
PH UA: 6 (ref 5.0–8.0)
PROTEIN UA: NEGATIVE
Spec Grav, UA: 1.03 (ref 1.030–1.035)
Urobilinogen, UA: 0.2 (ref ?–2.0)

## 2017-02-21 MED ORDER — LEVOTHYROXINE SODIUM 150 MCG PO TABS: 150.0000 ug | ORAL_TABLET | Freq: Every day | ORAL | 3 refills | Status: DC

## 2017-02-21 NOTE — Patient Instructions (Signed)
WE NOW OFFER   Natalie Mullen's FAST TRACK!!!  SAME DAY Appointments for ACUTE CARE  Such as: Sprains, Injuries, cuts, abrasions, rashes, muscle pain, joint pain, back pain Colds, flu, sore throats, headache, allergies, cough, fever  Ear pain, sinus and eye infections Abdominal pain, nausea, vomiting, diarrhea, upset stomach Animal/insect bites  3 Easy Ways to Schedule: Walk-In Scheduling Call in scheduling Mychart Sign-up: [email or web address]         

## 2017-02-21 NOTE — Progress Notes (Signed)
   Subjective:    Patient ID: Natalie Mullen, female    DOB: 11/26/1965, 51 y.o.   MRN: [id number]  HPI 51 yr old female for a well exam. She feels great. She still plays tennis 4 days a week. She last saw Dr. Cyndie Chime 2 years ago.    Review of Systems  Constitutional: Negative.   HENT: Negative.   Eyes: Negative.   Respiratory: Negative.   Cardiovascular: Negative.   Gastrointestinal: Negative.   Genitourinary: Negative for decreased urine volume, difficulty urinating, dyspareunia, dysuria, enuresis, flank pain, frequency, hematuria, pelvic pain and urgency.  Musculoskeletal: Negative.   Skin: Negative.   Neurological: Negative.   Psychiatric/Behavioral: Negative.        Objective:   Physical Exam  Constitutional: She is oriented to person, place, and time. She appears well-developed and well-nourished. No distress.  HENT:  Head: Normocephalic and atraumatic.  Right Ear: External ear normal.  Left Ear: External ear normal.  Nose: Nose normal.  Mouth/Throat: Oropharynx is clear and moist. No oropharyngeal exudate.  Eyes: Conjunctivae and EOM are normal. Pupils are equal, round, and reactive to light. No scleral icterus.  Neck: Normal range of motion. Neck supple. No JVD present. No thyromegaly present.  Cardiovascular: Normal rate, regular rhythm, normal heart sounds and intact distal pulses.  Exam reveals no gallop and no friction rub.   No murmur heard. Pulmonary/Chest: Effort normal and breath sounds normal. No respiratory distress. She has no wheezes. She has no rales. She exhibits no tenderness.  Abdominal: Soft. Bowel sounds are normal. She exhibits no distension and no mass. There is no tenderness. There is no rebound and no guarding.  Musculoskeletal: Normal range of motion. She exhibits no edema or tenderness.  Lymphadenopathy:    She has no cervical adenopathy.  Neurological: She is alert and oriented to person, place, and time. She has normal reflexes. No cranial  nerve deficit. She exhibits normal muscle tone. Coordination normal.  Skin: Skin is warm and dry. No rash noted. No erythema.  Psychiatric: She has a normal mood and affect. Her behavior is normal. Judgment and thought content normal.          Assessment & Plan:  Well exam. We discussed diet and exercise. Get fasting labs. She will set up a follow up visit with Dr. Cyndie Chime.  Gershon Crane, MD

## 2017-02-21 NOTE — Progress Notes (Signed)
Pre visit review using our clinic review tool, if applicable. No additional management support is needed unless otherwise documented below in the visit note. 

## 2017-02-22 LAB — CBC WITH DIFFERENTIAL/PLATELET
BASOS PCT: 0.4 % (ref 0.0–3.0)
Basophils Absolute: 0 10*3/uL (ref 0.0–0.1)
EOS ABS: 0.4 10*3/uL (ref 0.0–0.7)
Eosinophils Relative: 8.1 % — ABNORMAL HIGH (ref 0.0–5.0)
HCT: 37 % (ref 36.0–46.0)
Hemoglobin: 12.9 g/dL (ref 12.0–15.0)
Lymphocytes Relative: 35.8 % (ref 12.0–46.0)
Lymphs Abs: 1.9 10*3/uL (ref 0.7–4.0)
MCHC: 34.9 g/dL (ref 30.0–36.0)
MCV: 88.5 fl (ref 78.0–100.0)
MONO ABS: 0.4 10*3/uL (ref 0.1–1.0)
Monocytes Relative: 7.2 % (ref 3.0–12.0)
NEUTROS PCT: 48.5 % (ref 43.0–77.0)
Neutro Abs: 2.6 10*3/uL (ref 1.4–7.7)
PLATELETS: 294 10*3/uL (ref 150.0–400.0)
RBC: 4.18 Mil/uL (ref 3.87–5.11)
RDW: 12.4 % (ref 11.5–15.5)
WBC: 5.3 10*3/uL (ref 4.0–10.5)

## 2017-02-22 LAB — LIPID PANEL
CHOL/HDL RATIO: 5
Cholesterol: 171 mg/dL (ref 0–200)
HDL: 33.1 mg/dL — ABNORMAL LOW (ref >39.00)
LDL CALC: 102 mg/dL — AB (ref 0–99)
NonHDL: 138.01
Triglycerides: 179 mg/dL — ABNORMAL HIGH (ref 0.0–149.0)
VLDL: 35.8 mg/dL (ref 0.0–40.0)

## 2017-02-22 LAB — HEPATIC FUNCTION PANEL
ALT: 34 U/L (ref 0–35)
AST: 28 U/L (ref 0–37)
Albumin: 3.9 g/dL (ref 3.5–5.2)
Alkaline Phosphatase: 59 U/L (ref 39–117)
BILIRUBIN DIRECT: 0.1 mg/dL (ref 0.0–0.3)
TOTAL PROTEIN: 6.5 g/dL (ref 6.0–8.3)
Total Bilirubin: 0.7 mg/dL (ref 0.2–1.2)

## 2017-02-22 LAB — BASIC METABOLIC PANEL
BUN: 15 mg/dL (ref 6–23)
CHLORIDE: 106 meq/L (ref 96–112)
CO2: 28 meq/L (ref 19–32)
Calcium: 9.6 mg/dL (ref 8.4–10.5)
Creatinine, Ser: 0.77 mg/dL (ref 0.40–1.20)
GFR: 83.35 mL/min (ref >60.00)
Glucose, Bld: 54 mg/dL — ABNORMAL LOW (ref 70–99)
Potassium: 3.7 mEq/L (ref 3.5–5.1)
Sodium: 140 mEq/L (ref 135–145)

## 2017-02-22 LAB — TSH: TSH: 1.09 u[IU]/mL (ref 0.35–4.50)

## 2017-03-16 DIAGNOSIS — J342 Deviated nasal septum: Secondary | ICD-10-CM | POA: Diagnosis not present

## 2017-03-16 DIAGNOSIS — Z8673 Personal history of transient ischemic attack (TIA), and cerebral infarction without residual deficits: Secondary | ICD-10-CM | POA: Diagnosis not present

## 2017-03-16 DIAGNOSIS — J343 Hypertrophy of nasal turbinates: Secondary | ICD-10-CM | POA: Diagnosis not present

## 2017-03-16 DIAGNOSIS — J302 Other seasonal allergic rhinitis: Secondary | ICD-10-CM | POA: Diagnosis not present

## 2017-03-23 ENCOUNTER — Ambulatory Visit: Payer: BLUE CROSS/BLUE SHIELD | Admitting: Endocrinology

## 2017-03-27 ENCOUNTER — Other Ambulatory Visit: Payer: Self-pay | Admitting: Endocrinology

## 2017-05-07 ENCOUNTER — Encounter: Payer: Self-pay | Admitting: Family Medicine

## 2017-05-09 NOTE — Telephone Encounter (Signed)
Unless he has symptoms (like headache, fevers, rashes) I usually only give Doxycycline for 10 days

## 2017-05-24 DIAGNOSIS — N6320 Unspecified lump in the left breast, unspecified quadrant: Secondary | ICD-10-CM | POA: Diagnosis not present

## 2017-05-31 DIAGNOSIS — N6489 Other specified disorders of breast: Secondary | ICD-10-CM | POA: Diagnosis not present

## 2017-05-31 DIAGNOSIS — N6012 Diffuse cystic mastopathy of left breast: Secondary | ICD-10-CM | POA: Diagnosis not present

## 2017-06-06 ENCOUNTER — Encounter: Payer: Self-pay | Admitting: Endocrinology

## 2017-06-06 ENCOUNTER — Ambulatory Visit (INDEPENDENT_AMBULATORY_CARE_PROVIDER_SITE_OTHER): Payer: BLUE CROSS/BLUE SHIELD | Admitting: Endocrinology

## 2017-06-06 ENCOUNTER — Other Ambulatory Visit: Payer: Self-pay | Admitting: Endocrinology

## 2017-06-06 VITALS — BP 122/82 | HR 64 | Ht 67.0 in | Wt 139.0 lb

## 2017-06-06 DIAGNOSIS — E038 Other specified hypothyroidism: Secondary | ICD-10-CM

## 2017-06-06 DIAGNOSIS — E538 Deficiency of other specified B group vitamins: Secondary | ICD-10-CM | POA: Diagnosis not present

## 2017-06-06 DIAGNOSIS — E271 Primary adrenocortical insufficiency: Secondary | ICD-10-CM

## 2017-06-06 DIAGNOSIS — E063 Autoimmune thyroiditis: Secondary | ICD-10-CM | POA: Diagnosis not present

## 2017-06-06 LAB — BASIC METABOLIC PANEL
BUN: 17 mg/dL (ref 6–23)
CALCIUM: 10 mg/dL (ref 8.4–10.5)
CO2: 26 meq/L (ref 19–32)
Chloride: 102 mEq/L (ref 96–112)
Creatinine, Ser: 0.88 mg/dL (ref 0.40–1.20)
GFR: 71.37 mL/min (ref >60.00)
GLUCOSE: 96 mg/dL (ref 70–99)
Potassium: 3.9 mEq/L (ref 3.5–5.1)
Sodium: 136 mEq/L (ref 135–145)

## 2017-06-06 LAB — CORTISOL: Cortisol, Plasma: 3.6 ug/dL

## 2017-06-06 LAB — VITAMIN B12: Vitamin B-12: 511 pg/mL (ref 211–911)

## 2017-06-06 LAB — TSH: TSH: 3.98 u[IU]/mL (ref 0.35–4.50)

## 2017-06-06 NOTE — Patient Instructions (Addendum)
blood tests are requested for you today.  We'll let you know about the results.   A bleaching agent will help to lighten the skin.  Please come back for a follow-up appointment in 6 months.

## 2017-06-06 NOTE — Progress Notes (Signed)
Subjective:     Patient ID: Natalie Mullen, female    DOB: October 10, 1966, 51 y.o.   MRN: [id number]  HPI Pt returns for fu of Addison's Dz (dx'ed 2016, when she presented with diffuse hyperpigmentation).  She stopped taking prednisone.  She says this is because, when she took it in the past for autoimmune hepatitis, it "transformed my face." she takes synthroid as rx'ed.  She does not take a B-12 supplement. She has slight worsening of chronic darkening of the skin throughout the body, but no assoc dizziness. Past Medical History:  Diagnosis Date  . 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 Anticardiolipin antibody positive    sees Dr. Cyndie Chime  . Antiphospholipid antibody with hypercoagulable state (HCC) 03/27/2012   Initial presentation 05/2010 with acute left MCA stroke  . Autoimmune hepatitis [facility])    sees Dr. Daun Peacock   . Chronic anticoagulation 11/05/2013  . Dysphagia as late effect of stroke 03/27/2012   Possible component of reflux esophagitis  . Hypothyroid   . 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 APPENDECTOMY    . CESAREAN SECTION     3  . COLONOSCOPY  01/30/2015   per Dr. Kinnie Scales, clear, repeat in 10 yrs   . LASIK    . TONSILLECTOMY      Social History   Social History  . Marital status: Divorced    Spouse name: N/A  . Number of children: 3  . Years of education: Masters   Occupational History  . House builder - constuction    Social History Main Topics  . Smoking status: Former Smoker    Types: Cigarettes    Quit date: 06/17/2010  . Smokeless tobacco: Never Used  . Alcohol use No  . Drug use: No  . Sexual activity: Not on file   Other Topics Concern  . Not on file   Social History Narrative   Lives at home alone.   Originally from  [place]   Avid tennis player   Occasional caffeine use.    Current Outpatient Prescriptions on File Prior to Visit  Medication Sig Dispense Refill  . Cholecalciferol (VITAMIN D PO) Take by mouth every other day.    . ferrous sulfate 325 (65 FE) MG EC tablet Take 325 mg by mouth daily with breakfast.    . levothyroxine (SYNTHROID, LEVOTHROID) 150 MCG tablet Take 1 tablet (150 mcg total) by mouth daily. (Patient taking differently: Take 150 mcg by mouth daily. 1/2 tab daily) 90 tablet 3   No current facility-administered medications on file prior to visit.     No Known Allergies  Family History  Problem Relation Age of Onset  . Heart disease Father   . Arthritis Unknown   . Stroke Unknown   . Other Mother        Accident - had a fall    BP 122/82   Pulse 64   Ht 5\' 7"  (1.702 m)   Wt 139 lb (63 kg)   SpO2 96%   BMI 21.77 kg/m    Review of Systems She denies LOC.  No recent change in facial shape    Objective:   Physical Exam VITAL SIGNS:  See vs page GENERAL: no distress NECK: There is no palpable thyroid enlargement.  No thyroid nodule is palpable.  No palpable lymphadenopathy at the anterior neck. Skin: diffuse hyperpigmentation.   Ext: no edema.       Assessment & Plan:  Hyperpigmentation: she declines to resume prednisone.  Check ACTH.  This may convince her to resume.  Addison's Dz: Please continue the same fludrocortisone.  B-12 def, therapy limited by noncompliance.  i'll do the best i can.  Recheck today.   Patient Instructions  blood tests are requested for you today.  We'll let you know about the results.   A bleaching agent will help to lighten the skin.  Please come back for a follow-up appointment in 6 months.

## 2017-06-08 ENCOUNTER — Encounter: Payer: Self-pay | Admitting: Endocrinology

## 2017-06-08 LAB — ACTH: C206 ACTH: 795 pg/mL — ABNORMAL HIGH (ref 6–50)

## 2017-06-09 ENCOUNTER — Other Ambulatory Visit: Payer: Self-pay | Admitting: Endocrinology

## 2017-06-09 MED ORDER — PREDNISONE 5 MG PO TABS: 5.0000 mg | ORAL_TABLET | ORAL | 11 refills | Status: DC

## 2017-07-06 ENCOUNTER — Other Ambulatory Visit: Payer: Self-pay | Admitting: Endocrinology

## 2017-08-08 ENCOUNTER — Other Ambulatory Visit: Payer: Self-pay | Admitting: Endocrinology

## 2017-08-11 ENCOUNTER — Encounter: Payer: Self-pay | Admitting: Family Medicine

## 2017-08-15 ENCOUNTER — Telehealth: Payer: Self-pay | Admitting: Endocrinology

## 2017-08-15 MED ORDER — DEXAMETHASONE 0.5 MG PO TABS: 0.5000 mg | ORAL_TABLET | Freq: Every day | ORAL | 11 refills | Status: DC

## 2017-08-15 NOTE — Telephone Encounter (Signed)
Patient stopped taking prednisone and fludrocortisone b/c it was making her dizzy.  Would like to discuss other option to treat.   Please advise,  Ty,  -LL

## 2017-08-15 NOTE — Telephone Encounter (Signed)
I have sent a prescription to your pharmacy, for an alternative to the prednisone. If that goes OK, try adding back the fludrocortisone.

## 2017-08-15 NOTE — Telephone Encounter (Signed)
Notified patient.

## 2017-08-24 DIAGNOSIS — H5213 Myopia, bilateral: Secondary | ICD-10-CM | POA: Diagnosis not present

## 2017-08-24 DIAGNOSIS — H04123 Dry eye syndrome of bilateral lacrimal glands: Secondary | ICD-10-CM | POA: Diagnosis not present

## 2017-09-06 ENCOUNTER — Other Ambulatory Visit: Payer: Self-pay | Admitting: Endocrinology

## 2017-10-05 ENCOUNTER — Other Ambulatory Visit: Payer: Self-pay | Admitting: Endocrinology

## 2017-10-25 DIAGNOSIS — Z01419 Encounter for gynecological examination (general) (routine) without abnormal findings: Secondary | ICD-10-CM | POA: Diagnosis not present

## 2017-10-25 DIAGNOSIS — Z6822 Body mass index (BMI) 22.0-22.9, adult: Secondary | ICD-10-CM | POA: Diagnosis not present

## 2017-11-02 DIAGNOSIS — Z13820 Encounter for screening for osteoporosis: Secondary | ICD-10-CM | POA: Diagnosis not present

## 2017-11-03 ENCOUNTER — Other Ambulatory Visit: Payer: Self-pay | Admitting: Oncology

## 2017-11-03 DIAGNOSIS — D6861 Antiphospholipid syndrome: Secondary | ICD-10-CM

## 2017-11-03 DIAGNOSIS — Z7901 Long term (current) use of anticoagulants: Secondary | ICD-10-CM

## 2017-11-03 DIAGNOSIS — R760 Raised antibody titer: Secondary | ICD-10-CM

## 2017-11-07 ENCOUNTER — Other Ambulatory Visit (INDEPENDENT_AMBULATORY_CARE_PROVIDER_SITE_OTHER): Payer: BLUE CROSS/BLUE SHIELD

## 2017-11-07 DIAGNOSIS — Z7901 Long term (current) use of anticoagulants: Secondary | ICD-10-CM | POA: Diagnosis not present

## 2017-11-07 DIAGNOSIS — R760 Raised antibody titer: Secondary | ICD-10-CM

## 2017-11-07 DIAGNOSIS — D6861 Antiphospholipid syndrome: Secondary | ICD-10-CM

## 2017-11-09 LAB — PTT-LA MIX: PTT-LA MIX: 62.3 s — AB (ref 0.0–48.9)

## 2017-11-09 LAB — LUPUS ANTICOAGULANT PANEL
Dilute Viper Venom Time: 81.9 s — ABNORMAL HIGH (ref 0.0–47.0)
PTT LA: 73.5 s — AB (ref 0.0–51.9)

## 2017-11-09 LAB — HEXAGONAL PHASE PHOSPHOLIPID: HEXAGONAL PHASE PHOSPHOLIPID: 51 s — AB (ref 0–11)

## 2017-11-09 LAB — DRVVT CONFIRM: dRVVT Confirm: 2.2 ratio — ABNORMAL HIGH (ref 0.8–1.2)

## 2017-11-09 LAB — DRVVT MIX: DRVVT MIX: 74.6 s — AB (ref 0.0–47.0)

## 2017-11-10 LAB — COMPREHENSIVE METABOLIC PANEL
A/G RATIO: 1.3 (ref 1.2–2.2)
ALK PHOS: 72 IU/L (ref 39–117)
ALT: 39 IU/L — ABNORMAL HIGH (ref 0–32)
AST: 29 IU/L (ref 0–40)
Albumin: 4.2 g/dL (ref 3.5–5.5)
BUN/Creatinine Ratio: 15 (ref 9–23)
BUN: 14 mg/dL (ref 6–24)
Bilirubin Total: 0.6 mg/dL (ref 0.0–1.2)
CO2: 25 mmol/L (ref 20–29)
CREATININE: 0.96 mg/dL (ref 0.57–1.00)
Calcium: 10.6 mg/dL — ABNORMAL HIGH (ref 8.7–10.2)
Chloride: 101 mmol/L (ref 96–106)
GFR calc Af Amer: 78 mL/min/{1.73_m2} (ref >59)
GFR calc non Af Amer: 68 mL/min/{1.73_m2} (ref >59)
GLOBULIN, TOTAL: 3.2 g/dL (ref 1.5–4.5)
Glucose: 85 mg/dL (ref 65–99)
POTASSIUM: 4.5 mmol/L (ref 3.5–5.2)
SODIUM: 137 mmol/L (ref 134–144)
Total Protein: 7.4 g/dL (ref 6.0–8.5)

## 2017-11-10 LAB — CBC WITH DIFFERENTIAL/PLATELET
BASOS: 1 %
Basophils Absolute: 0 10*3/uL (ref 0.0–0.2)
EOS (ABSOLUTE): 0.2 10*3/uL (ref 0.0–0.4)
Eos: 3 %
Hematocrit: 39.7 % (ref 34.0–46.6)
Hemoglobin: 14.1 g/dL (ref 11.1–15.9)
IMMATURE GRANULOCYTES: 0 %
Immature Grans (Abs): 0 10*3/uL (ref 0.0–0.1)
Lymphocytes Absolute: 2.3 10*3/uL (ref 0.7–3.1)
Lymphs: 42 %
MCH: 31.8 pg (ref 26.6–33.0)
MCHC: 35.5 g/dL (ref 31.5–35.7)
MCV: 90 fL (ref 79–97)
MONOS ABS: 0.4 10*3/uL (ref 0.1–0.9)
Monocytes: 7 %
NEUTROS PCT: 47 %
Neutrophils Absolute: 2.5 10*3/uL (ref 1.4–7.0)
PLATELETS: 272 10*3/uL (ref 150–379)
RBC: 4.43 x10E6/uL (ref 3.77–5.28)
RDW: 13.9 % (ref 12.3–15.4)
WBC: 5.5 10*3/uL (ref 3.4–10.8)

## 2017-11-10 LAB — CARDIOLIPIN ANTIBODIES, IGG, IGM, IGA
ANTICARDIOLIPIN IGA: 59 U/mL — AB (ref 0–11)
ANTICARDIOLIPIN IGG: 146 GPL U/mL — AB (ref 0–14)
ANTICARDIOLIPIN IGM: 103 [MPL'U]/mL — AB (ref 0–12)

## 2017-11-10 LAB — BETA-2-GLYCOPROTEIN I ABS, IGG/M/A
BETA 2 GLYCO 1 IGM: 100 GPI IgM units — AB (ref 0–32)
Beta-2 Glyco 1 IgA: >150 GPI IgA units — ABNORMAL HIGH (ref 0–25)
Beta-2 Glyco I IgG: >150 GPI IgG units — ABNORMAL HIGH (ref 0–20)

## 2017-11-10 LAB — SEDIMENTATION RATE: SED RATE: 8 mm/h (ref 0–40)

## 2017-11-10 LAB — LACTATE DEHYDROGENASE: LDH: 133 IU/L (ref 119–226)

## 2017-12-07 ENCOUNTER — Ambulatory Visit: Payer: BLUE CROSS/BLUE SHIELD | Admitting: Endocrinology

## 2017-12-07 ENCOUNTER — Encounter: Payer: Self-pay | Admitting: Endocrinology

## 2017-12-07 VITALS — BP 102/68 | HR 54 | Wt 143.2 lb

## 2017-12-07 DIAGNOSIS — E271 Primary adrenocortical insufficiency: Secondary | ICD-10-CM | POA: Diagnosis not present

## 2017-12-07 DIAGNOSIS — E063 Autoimmune thyroiditis: Secondary | ICD-10-CM

## 2017-12-07 DIAGNOSIS — E038 Other specified hypothyroidism: Secondary | ICD-10-CM | POA: Diagnosis not present

## 2017-12-07 MED ORDER — FLUDROCORTISONE ACETATE 0.1 MG PO TABS: 0.1000 mg | ORAL_TABLET | Freq: Every day | ORAL | 3 refills | Status: DC

## 2017-12-07 MED ORDER — DEXAMETHASONE 0.5 MG PO TABS: 0.5000 mg | ORAL_TABLET | Freq: Every day | ORAL | 3 refills | Status: DC

## 2017-12-07 NOTE — Progress Notes (Signed)
Subjective:    Patient ID: Natalie Mullen, female    DOB: 1966-01-25, 52 y.o.   MRN: [id number]  HPI Pt returns for fu of Addison's Dz (dx'ed 2016, when she presented with diffuse hyperpigmentation; she did not tolerate prednisone (skin sensation)).  She takes decadron but not the florinef.  pt states she feels well in general.   Past Medical History:  Diagnosis Date  . 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 Anticardiolipin antibody positive    sees Dr. Cyndie Chime  . Antiphospholipid antibody with hypercoagulable state (HCC) 03/27/2012   Initial presentation 05/2010 with acute left MCA stroke  . Autoimmune hepatitis [facility])    sees Dr. Daun Peacock   . Chronic anticoagulation 11/05/2013  . Dysphagia as late effect of stroke 03/27/2012   Possible component of reflux esophagitis  . Hypothyroid   . 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 APPENDECTOMY    . CESAREAN SECTION     3  . COLONOSCOPY  01/30/2015   per Dr. Kinnie Scales, clear, repeat in 10 yrs   . LASIK    . TONSILLECTOMY      Social History   Socioeconomic History  . Marital status: Divorced    Spouse name: Not on file  . Number of children: 3  . Years of education: Masters  . Highest education level: Not on file  Social Needs  . Financial resource strain: Not on file  . Food insecurity - worry: Not on file  . Food insecurity - inability: Not on file  . Transportation needs - medical: Not on file  . Transportation needs - non-medical: Not on file  Occupational History  . Occupation: Research scientist (physical sciences) - constuction  Tobacco Use  . Smoking status: Former Smoker    Types: Cigarettes    Last attempt to quit: 06/17/2010    Years since quitting: 7.4  . Smokeless tobacco: Never Used  Substance and Sexual Activity    . Alcohol use: No    Alcohol/week: 0.0 oz  . Drug use: No  . Sexual activity: Not on file  Other Topics Concern  . Not on file  Social History Narrative   Lives at home alone.   Originally from [place]   Avid tennis player   Occasional caffeine use.    Current Outpatient Medications on File Prior to Visit  Medication Sig Dispense Refill  . Cholecalciferol (VITAMIN D PO) Take by mouth every other day.    . ferrous sulfate 325 (65 FE) MG EC tablet Take 325 mg by mouth daily with breakfast.    . levothyroxine (SYNTHROID, LEVOTHROID) 150 MCG tablet Take 1 tablet (150 mcg total) by mouth daily. (Patient taking differently: Take 150 mcg by mouth daily. 1/2 tab daily) 90 tablet 3   No current facility-administered medications on file prior to visit.     No Known Allergies  Family History  Problem Relation Age of Onset  . Heart disease Father   . Arthritis Unknown   . Stroke Unknown   . Other Mother        Accident - had a fall    BP 102/68 (BP Location: Left Arm, Patient Position: Sitting, Cuff Size: Normal)   Pulse (!) 54   Wt 143 lb 3.2 oz (65 kg)   SpO2  97%   BMI 22.43 kg/m    Review of Systems Denies dizziness and LOC    Objective:   Physical Exam VITAL SIGNS:  See vs page GENERAL: no distress NECK: There is no palpable thyroid enlargement.  No thyroid nodule is palpable.  No palpable lymphadenopathy at the anterior neck.        Assessment & Plan:  Addison's dz.  She should resume florinef Hypothyroidism, due for recheck  Patient Instructions  blood tests are requested for you today.  We'll let you know about the results.   I have sent a prescription to your pharmacy, to resume the fludrocortisone Please come back for a follow-up appointment in 6 months.

## 2017-12-07 NOTE — Patient Instructions (Addendum)
blood tests are requested for you today.  We'll let you know about the results.   I have sent a prescription to your pharmacy, to resume the fludrocortisone Please come back for a follow-up appointment in 6 months.

## 2017-12-08 ENCOUNTER — Other Ambulatory Visit: Payer: Self-pay | Admitting: Endocrinology

## 2017-12-08 LAB — BASIC METABOLIC PANEL
BUN: 12 mg/dL (ref 6–23)
CALCIUM: 9.8 mg/dL (ref 8.4–10.5)
CO2: 27 mEq/L (ref 19–32)
Chloride: 102 mEq/L (ref 96–112)
Creatinine, Ser: 0.95 mg/dL (ref 0.40–1.20)
GFR: 65.21 mL/min (ref >60.00)
Glucose, Bld: 89 mg/dL (ref 70–99)
Potassium: 4.6 mEq/L (ref 3.5–5.1)
SODIUM: 135 meq/L (ref 135–145)

## 2017-12-08 LAB — TSH: TSH: 5.45 u[IU]/mL — AB (ref 0.35–4.50)

## 2017-12-08 MED ORDER — LEVOTHYROXINE SODIUM 88 MCG PO TABS: 88.0000 ug | ORAL_TABLET | Freq: Every day | ORAL | 3 refills | Status: DC

## 2017-12-14 ENCOUNTER — Ambulatory Visit: Payer: BLUE CROSS/BLUE SHIELD | Admitting: Oncology

## 2017-12-14 ENCOUNTER — Encounter: Payer: Self-pay | Admitting: Oncology

## 2017-12-14 ENCOUNTER — Other Ambulatory Visit: Payer: Self-pay

## 2017-12-14 VITALS — BP 105/54 | HR 54 | Temp 97.5°F | Wt 145.0 lb

## 2017-12-14 DIAGNOSIS — D6861 Antiphospholipid syndrome: Secondary | ICD-10-CM | POA: Diagnosis not present

## 2017-12-14 DIAGNOSIS — E271 Primary adrenocortical insufficiency: Secondary | ICD-10-CM | POA: Diagnosis not present

## 2017-12-14 DIAGNOSIS — I69351 Hemiplegia and hemiparesis following cerebral infarction affecting right dominant side: Secondary | ICD-10-CM | POA: Diagnosis not present

## 2017-12-14 DIAGNOSIS — Z87891 Personal history of nicotine dependence: Secondary | ICD-10-CM

## 2017-12-14 DIAGNOSIS — I69391 Dysphagia following cerebral infarction: Secondary | ICD-10-CM

## 2017-12-14 DIAGNOSIS — Z7982 Long term (current) use of aspirin: Secondary | ICD-10-CM

## 2017-12-14 DIAGNOSIS — Z8673 Personal history of transient ischemic attack (TIA), and cerebral infarction without residual deficits: Secondary | ICD-10-CM

## 2017-12-14 DIAGNOSIS — F1310 Sedative, hypnotic or anxiolytic abuse, uncomplicated: Secondary | ICD-10-CM

## 2017-12-14 MED ORDER — WARFARIN SODIUM 2 MG PO TABS: 4.0000 mg | ORAL_TABLET | Freq: Every day | ORAL | 4 refills | Status: DC

## 2017-12-14 NOTE — Progress Notes (Signed)
Hematology and Oncology Follow Up Visit  Natalie Mullen [id number] [birth year] 52 y.o. 12/14/2017 4:32 PM   Principle Diagnosis: Encounter Diagnoses  Name Primary?  . History of ischemic left MCA stroke   . 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 Antiphospholipid antibody with hypercoagulable state [facility]) Yes  Updated clinical summary: 52 year old woman I have followed since August 2011.  She presented with a left MCA stroke in July 2011 as the first sign of antiphospholipid antibody syndrome.  She was fortunate enough to have been near a good hospital with capability to do thrombolytic therapy and has almost no residual neurologic deficits except for some mild swallowing dysfunction and minimal decreased strength in her right arm. She has gone on to develop a number of autoimmune endocrinopathies including thyroiditis, adrenalitis, and hepatitis.  With acute deterioration in her liver function in October 2014 requiring steroids and azathioprine, I stopped her long-term Coumadin anticoagulation and put her on Xarelto.  Her liver function ultimately normalized and the azathioprine was discontinued.  She is on steroid and thyroid replacement. For additional details please see previous office notes.   Interim History: I have not seen her in over 2 years.  There is increasing data from clinical trials assessing the new oral anticoagulants in patients with antiphospholipid antibody syndrome that has raised a significant amount of concern that these drugs are not adequate to treat high risk patients.  One major literature article just published in the Journal blood in September 2018 randomized patients with antiphospholipid antibody syndrome all of whom had a previous thrombotic event either arterial or venous or both, to Xarelto versus warfarin.  Although there were only 60 patients in each cohort, the results were provocative.  With a mean follow-up of about 1-1/2 years, there were 4 ischemic  strokes, 3 MIs, and four major bleeds in the Xarelto group compared with no recurrent thrombotic events and 2 major bleeding events in the warfarin group.  Additional data presented at our international hematology meeting in December 2018 also supports use of caution in considering the use of these new anticoagulants in this group of patients. I called the patient when I got back from the meeting and told her I would like her to go back on Coumadin.  She told me that she stopped all anticoagulants and aspirin about a year and a half ago.  Over the phone, she was opposed to going back on Coumadin but she agreed to at least go back on aspirin and Xarelto until we could have a formal visit together. She remains asymptomatic at this time.  No headache, change in vision, slurred speech, focal weakness, or paresthesias.  Subtle decreased strength in her right arm affecting her tennis serve.  No polyarthralgias or polymyalgias.  No cardiorespiratory complaints.  Medications: reviewed  Allergies: No Known Allergies  Review of Systems: See interim history Remaining ROS negative:   Physical Exam: Blood pressure (!) 105/54, pulse (!) 54, temperature (!) 97.5 F (36.4 C), temperature source Oral, weight 145 lb (65.8 kg), SpO2 100 %. Wt Readings from Last 3 Encounters:  12/14/17 145 lb (65.8 kg)  12/07/17 143 lb 3.2 oz (65 kg)  06/06/17 139 lb (63 kg)     General appearance: Well-nourished woman.  Mildly cushingoid.  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 HENNT: Pharynx no erythema, exudate, mass, or ulcer. No thyromegaly or thyroid nodules Lymph nodes: No cervical, supraclavicular, or axillary lymphadenopathy Breasts:  Lungs: Clear to auscultation, resonant to percussion throughout  Heart: Regular rhythm, no murmur, no gallop, no rub, no click, no edema Abdomen: Soft, nontender, normal bowel sounds, no mass, no organomegaly Extremities: No edema, no calf  tenderness Musculoskeletal: no joint deformities GU:  Vascular: Carotid pulses 2+, no bruits, distal pulses: Dorsalis pedis 1+ symmetric Neurologic: Slight right facial dystocia.  Alert, oriented, PERRLA, optic discs sharp and vessels normal, no hemorrhage or exudate, cranial nerves grossly normal, motor strength 5 over 5, reflexes 1+ symmetric, upper body coordination normal, gait normal, Skin: No rash or ecchymosis  Lab Results: CBC W/Diff    Component Value Date/Time   WBC 5.5 11/07/2017 1159   WBC 5.3 02/21/2017 1518   RBC 4.43 11/07/2017 1159   RBC 4.18 02/21/2017 1518   HGB 14.1 11/07/2017 1159   HGB 14.0 11/05/2013 1404   HCT 39.7 11/07/2017 1159   HCT 41.0 11/05/2013 1404   PLT 272 11/07/2017 1159   MCV 90 11/07/2017 1159   MCV 86.9 11/05/2013 1404   MCH 31.8 11/07/2017 1159   MCH 31.7 09/01/2015 0556   MCHC 35.5 11/07/2017 1159   MCHC 34.9 02/21/2017 1518   RDW 13.9 11/07/2017 1159   RDW 14.6 (H) 11/05/2013 1404   LYMPHSABS 2.3 11/07/2017 1159   LYMPHSABS 1.9 11/05/2013 1404   MONOABS 0.4 02/21/2017 1518   MONOABS 0.5 11/05/2013 1404   EOSABS 0.2 11/07/2017 1159   BASOSABS 0.0 11/07/2017 1159   BASOSABS 0.0 11/05/2013 1404     Chemistry      Component Value Date/Time   NA 135 12/07/2017 1653   NA 137 11/07/2017 1159   NA 138 11/05/2013 1404   K 4.6 12/07/2017 1653   K 3.9 11/05/2013 1404   CL 102 12/07/2017 1653   CL 105 04/03/2013 0849   CO2 27 12/07/2017 1653   CO2 25 11/05/2013 1404   BUN 12 12/07/2017 1653   BUN 14 11/07/2017 1159   BUN 11.7 11/05/2013 1404   CREATININE 0.95 12/07/2017 1653   CREATININE 0.91 03/04/2015 1111   CREATININE 0.9 11/05/2013 1404      Component Value Date/Time   CALCIUM 9.8 12/07/2017 1653   CALCIUM 9.9 11/05/2013 1404   ALKPHOS 72 11/07/2017 1159   ALKPHOS 71 11/05/2013 1404   AST 29 11/07/2017 1159   AST 169 (H) 11/05/2013 1404   ALT 39 (H) 11/07/2017 1159   ALT 267 (H) 11/05/2013 1404   BILITOT 0.6 11/07/2017  1159   BILITOT 0.87 11/05/2013 1404       Radiological Studies: No results found.  Impression: 1.  High risk, triple positive, antiphospholipid antibody syndrome  2.  Ischemic stroke secondary to 1  3.  Polyendocrinopathy: Thyroid, liver, and adrenal, secondary to 1.  We discussed in detail the mounting clinical evidence that the new oral anticoagulants are suboptimal treatment for high risk antiphospholipid antibody patients.  Despite her high "warfarin hate factor" she is willing to go back on the drug.  She is interested in a home Coumadin monitor and we will explore this. I am going to start her on Coumadin 4 mg daily.  Continue aspirin 81 mg daily.  Approximately 45 minutes spent with this patient with exam, direct face-to-face interaction, review of recent clinical data, and counseling.   CC: Patient Care Team: Nelwyn SalisburyFry, Stephen A, MD as PCP - General Cyndie ChimeGranfortuna, Genene ChurnJames M, MD as Consulting Physician (Oncology) Iva BoopGessner, Carl E, MD as Consulting Physician (Gastroenterology) Romero BellingEllison, Sean, MD as Consulting Physician (Endocrinology)   Cephas DarbyJames Danille Oppedisano, MD, [facility] Maryland CenterFACP  Hematology-Oncology/Internal Medicine  1/23/20194:32 PM

## 2017-12-14 NOTE — Patient Instructions (Signed)
Begin coumadin (warfarin) 2 mg tabs: take 2 tabs = 4 mg each evening Return for lab check on Wednesday February 6 We will advise on any dose change We will schedule you to be followed in our coumadin clinic and look in to getting you a home coumadin monitor Follow up visit with Dr Reece AgarG in 2-3 months

## 2018-02-08 ENCOUNTER — Telehealth: Payer: Self-pay | Admitting: Pharmacist

## 2018-02-08 NOTE — Progress Notes (Signed)
Tried calling patient to discuss warfarin therapy. Unable to reach. 

## 2018-02-09 NOTE — Telephone Encounter (Signed)
Tried calling patient to discuss warfarin therapy. Unable to reach.

## 2018-02-22 ENCOUNTER — Ambulatory Visit (INDEPENDENT_AMBULATORY_CARE_PROVIDER_SITE_OTHER): Payer: BLUE CROSS/BLUE SHIELD | Admitting: Family Medicine

## 2018-02-22 ENCOUNTER — Encounter: Payer: Self-pay | Admitting: Family Medicine

## 2018-02-22 VITALS — BP 102/70 | HR 62 | Temp 97.9°F | Ht 67.0 in | Wt 142.8 lb

## 2018-02-22 DIAGNOSIS — E785 Hyperlipidemia, unspecified: Secondary | ICD-10-CM | POA: Diagnosis not present

## 2018-02-22 DIAGNOSIS — D6861 Antiphospholipid syndrome: Secondary | ICD-10-CM

## 2018-02-22 DIAGNOSIS — Z8673 Personal history of transient ischemic attack (TIA), and cerebral infarction without residual deficits: Secondary | ICD-10-CM

## 2018-02-22 DIAGNOSIS — E038 Other specified hypothyroidism: Secondary | ICD-10-CM | POA: Diagnosis not present

## 2018-02-22 DIAGNOSIS — J3089 Other allergic rhinitis: Secondary | ICD-10-CM

## 2018-02-22 DIAGNOSIS — N951 Menopausal and female climacteric states: Secondary | ICD-10-CM

## 2018-02-22 DIAGNOSIS — E063 Autoimmune thyroiditis: Secondary | ICD-10-CM

## 2018-02-22 MED ORDER — ERYTHROMYCIN 5 MG/GM OP OINT: 1.0000 "application " | TOPICAL_OINTMENT | Freq: Three times a day (TID) | OPHTHALMIC | 0 refills | Status: DC

## 2018-02-22 NOTE — Progress Notes (Signed)
   Subjective:    Patient ID: Natalie Mullen, female    DOB: [birth year], 52 y.o.   MRN: [id number]  HPI Here for 4 issues. First for the past 3 weeks she has had a tender lump on the right lower eyelid. Second she wants to check her thyroid levels and her lipids again (she is not fasting today). Third she asks my opinion about HRT. She is having some mild menopausal symptoms like hot flashes but no mood swings and no sleep problems. She had a stroke 8 years ago and she took Xarelto for some years. Now she has stopped the anticoagulants and takes an 81 mg aspirin daily. She recently asked her GYN about trying HRT, but he refused saying her hx of stroke makes this too risky. Fourth she has had nasal congestion and ithcy eyes and PND for the past 2 weeks.    Review of Systems  Constitutional: Negative.   HENT: Positive for congestion, postnasal drip, rhinorrhea, sneezing and sore throat. Negative for sinus pain.   Eyes: Positive for itching.  Respiratory: Negative.   Neurological: Negative.        Objective:   Physical Exam  Constitutional: She is oriented to person, place, and time. She appears well-developed and well-nourished.  HENT:  Right Ear: External ear normal.  Left Ear: External ear normal.  Nose: Nose normal.  Mouth/Throat: Oropharynx is clear and moist.  Eyes: Pupils are equal, round, and reactive to light. Conjunctivae and EOM are normal.  There is a small tender stye on the edge of the right lower eyelid   Neck: No thyromegaly present.  Cardiovascular: Normal rate, regular rhythm, normal heart sounds and intact distal pulses.  Pulmonary/Chest: Effort normal and breath sounds normal. No respiratory distress. She has no wheezes. She has no rales.  Lymphadenopathy:    She has no cervical adenopathy.  Neurological: She is alert and oriented to person, place, and time.          Assessment & Plan:  (1) she has a stye so we will treat with Erythromycin ophthalmic ointment BID  and with warm compresses, (2) we will set her up for fasting labs soon to include a thyroid panel and lipids, (3) I agree with her GYN that it would be to great a risk for her to go on HRT given her stroke history. I would suggest an alternative product like Estroven OTC for menopausal symptoms, and (4) she has allergies so try Claritin and Flonase daily.  Gershon CraneStephen Subrina Vecchiarelli, MD

## 2018-03-01 ENCOUNTER — Other Ambulatory Visit: Payer: BLUE CROSS/BLUE SHIELD

## 2018-03-01 ENCOUNTER — Other Ambulatory Visit (INDEPENDENT_AMBULATORY_CARE_PROVIDER_SITE_OTHER): Payer: BLUE CROSS/BLUE SHIELD

## 2018-03-01 DIAGNOSIS — E785 Hyperlipidemia, unspecified: Secondary | ICD-10-CM | POA: Diagnosis not present

## 2018-03-01 DIAGNOSIS — E063 Autoimmune thyroiditis: Secondary | ICD-10-CM | POA: Diagnosis not present

## 2018-03-01 DIAGNOSIS — E038 Other specified hypothyroidism: Secondary | ICD-10-CM | POA: Diagnosis not present

## 2018-03-01 LAB — BASIC METABOLIC PANEL
BUN: 17 mg/dL (ref 6–23)
CALCIUM: 9.6 mg/dL (ref 8.4–10.5)
CO2: 26 mEq/L (ref 19–32)
CREATININE: 0.98 mg/dL (ref 0.40–1.20)
Chloride: 105 mEq/L (ref 96–112)
GFR: 62.86 mL/min (ref >60.00)
GLUCOSE: 96 mg/dL (ref 70–99)
Potassium: 3.8 mEq/L (ref 3.5–5.1)
Sodium: 138 mEq/L (ref 135–145)

## 2018-03-01 LAB — CBC WITH DIFFERENTIAL/PLATELET
BASOS ABS: 0 10*3/uL (ref 0.0–0.1)
Basophils Relative: 1 % (ref 0.0–3.0)
Eosinophils Absolute: 0.2 10*3/uL (ref 0.0–0.7)
Eosinophils Relative: 3.7 % (ref 0.0–5.0)
HEMATOCRIT: 39.6 % (ref 36.0–46.0)
HEMOGLOBIN: 14.1 g/dL (ref 12.0–15.0)
LYMPHS PCT: 49.4 % — AB (ref 12.0–46.0)
Lymphs Abs: 2.2 10*3/uL (ref 0.7–4.0)
MCHC: 35.5 g/dL (ref 30.0–36.0)
MCV: 89.4 fl (ref 78.0–100.0)
MONOS PCT: 5.7 % (ref 3.0–12.0)
Monocytes Absolute: 0.3 10*3/uL (ref 0.1–1.0)
NEUTROS PCT: 40.2 % — AB (ref 43.0–77.0)
Neutro Abs: 1.8 10*3/uL (ref 1.4–7.7)
Platelets: 300 10*3/uL (ref 150.0–400.0)
RBC: 4.43 Mil/uL (ref 3.87–5.11)
RDW: 12.4 % (ref 11.5–15.5)
WBC: 4.4 10*3/uL (ref 4.0–10.5)

## 2018-03-01 LAB — POC URINALSYSI DIPSTICK (AUTOMATED)
Bilirubin, UA: NEGATIVE
Blood, UA: NEGATIVE
Glucose, UA: NEGATIVE
KETONES UA: NEGATIVE
LEUKOCYTES UA: NEGATIVE
Nitrite, UA: NEGATIVE
PH UA: 5.5 (ref 5.0–8.0)
PROTEIN UA: NEGATIVE
UROBILINOGEN UA: 0.2 U/dL

## 2018-03-01 LAB — T4, FREE: FREE T4: 0.65 ng/dL (ref 0.60–1.60)

## 2018-03-01 LAB — HEPATIC FUNCTION PANEL
ALBUMIN: 4.1 g/dL (ref 3.5–5.2)
ALT: 18 U/L (ref 0–35)
AST: 17 U/L (ref 0–37)
Alkaline Phosphatase: 67 U/L (ref 39–117)
Bilirubin, Direct: 0.1 mg/dL (ref 0.0–0.3)
TOTAL PROTEIN: 7.3 g/dL (ref 6.0–8.3)
Total Bilirubin: 0.5 mg/dL (ref 0.2–1.2)

## 2018-03-01 LAB — LIPID PANEL
CHOL/HDL RATIO: 5
Cholesterol: 207 mg/dL — ABNORMAL HIGH (ref 0–200)
HDL: 45.1 mg/dL (ref >39.00)
LDL Cholesterol: 151 mg/dL — ABNORMAL HIGH (ref 0–99)
NONHDL: 161.81
Triglycerides: 56 mg/dL (ref 0.0–149.0)
VLDL: 11.2 mg/dL (ref 0.0–40.0)

## 2018-03-01 LAB — T3, FREE: T3, Free: 3 pg/mL (ref 2.3–4.2)

## 2018-03-01 LAB — TSH: TSH: 18.27 u[IU]/mL — ABNORMAL HIGH (ref 0.35–4.50)

## 2018-03-03 ENCOUNTER — Other Ambulatory Visit: Payer: Self-pay | Admitting: Family Medicine

## 2018-03-05 ENCOUNTER — Other Ambulatory Visit: Payer: Self-pay | Admitting: Endocrinology

## 2018-03-05 ENCOUNTER — Encounter: Payer: Self-pay | Admitting: Endocrinology

## 2018-03-05 DIAGNOSIS — E038 Other specified hypothyroidism: Secondary | ICD-10-CM

## 2018-03-05 DIAGNOSIS — E063 Autoimmune thyroiditis: Principal | ICD-10-CM

## 2018-03-05 MED ORDER — LEVOTHYROXINE SODIUM 112 MCG PO TABS: 112.0000 ug | ORAL_TABLET | Freq: Every day | ORAL | 3 refills | Status: DC

## 2018-03-19 ENCOUNTER — Telehealth: Payer: Self-pay | Admitting: Endocrinology

## 2018-03-19 NOTE — Telephone Encounter (Signed)
please call patient: She did not read the last result message.  Just wanted to make sure you got it.

## 2018-03-20 ENCOUNTER — Telehealth: Payer: Self-pay | Admitting: Endocrinology

## 2018-03-20 NOTE — Telephone Encounter (Signed)
Patient is returning call from office. ° °

## 2018-03-20 NOTE — Telephone Encounter (Signed)
I called patient & asked that she return our call regarding last results note.

## 2018-03-21 ENCOUNTER — Other Ambulatory Visit (INDEPENDENT_AMBULATORY_CARE_PROVIDER_SITE_OTHER): Payer: BLUE CROSS/BLUE SHIELD

## 2018-03-21 ENCOUNTER — Other Ambulatory Visit: Payer: Self-pay | Admitting: Endocrinology

## 2018-03-21 DIAGNOSIS — E038 Other specified hypothyroidism: Secondary | ICD-10-CM

## 2018-03-21 DIAGNOSIS — E063 Autoimmune thyroiditis: Secondary | ICD-10-CM

## 2018-03-21 LAB — T4, FREE: Free T4: 0.66 ng/dL (ref 0.60–1.60)

## 2018-03-21 LAB — TSH: TSH: 25.43 u[IU]/mL — AB (ref 0.35–4.50)

## 2018-03-21 MED ORDER — LEVOTHYROXINE SODIUM 150 MCG PO TABS: 150.0000 ug | ORAL_TABLET | Freq: Every day | ORAL | 5 refills | Status: DC

## 2018-03-21 NOTE — Telephone Encounter (Signed)
I called and made patient lab appointment for TSH labs from 4/14.

## 2018-05-31 DIAGNOSIS — Z1231 Encounter for screening mammogram for malignant neoplasm of breast: Secondary | ICD-10-CM | POA: Diagnosis not present

## 2018-06-01 ENCOUNTER — Encounter: Payer: Self-pay | Admitting: Family Medicine

## 2018-06-01 ENCOUNTER — Ambulatory Visit: Payer: BLUE CROSS/BLUE SHIELD | Admitting: Family Medicine

## 2018-06-01 VITALS — BP 102/62 | HR 58 | Temp 97.8°F | Ht 67.0 in | Wt 139.0 lb

## 2018-06-01 DIAGNOSIS — J3089 Other allergic rhinitis: Secondary | ICD-10-CM

## 2018-06-01 NOTE — Progress Notes (Signed)
   Subjective:    Patient ID: Bunnie PionLuisa Edmunds, female    DOB: Nov 24, 1965, 52 y.o.   MRN: [id number]  HPI Here for 3 days of a tickling sensation in the throat which makes her cough. She has had some sinus drainage for several weeks. No fever or headache or ST. She admits to drinking very little water, even though she plays a lot of tennis out in the heat.    Review of Systems  Constitutional: Negative.   HENT: Positive for postnasal drip. Negative for congestion, ear pain, sinus pressure, sinus pain and sore throat.   Eyes: Negative.   Respiratory: Positive for cough.        Objective:   Physical Exam  Constitutional: She appears well-developed and well-nourished.  HENT:  Right Ear: External ear normal.  Left Ear: External ear normal.  Nose: Nose normal.  Mouth/Throat: Oropharynx is clear and moist.  Eyes: Conjunctivae are normal.  Neck: No thyromegaly present.  Pulmonary/Chest: Effort normal and breath sounds normal. No stridor. No respiratory distress. She has no wheezes. She has no rales.  Lymphadenopathy:    She has no cervical adenopathy.          Assessment & Plan:  Allergies, try Claritin or Zyrtec daily. She needs to drink plenty of water, especially when exercising.  Gershon CraneStephen Makeda Peeks, MD

## 2018-06-06 ENCOUNTER — Ambulatory Visit: Payer: BLUE CROSS/BLUE SHIELD | Admitting: Endocrinology

## 2018-06-06 ENCOUNTER — Encounter: Payer: Self-pay | Admitting: Endocrinology

## 2018-06-06 VITALS — BP 110/64 | HR 59 | Ht 67.0 in | Wt 139.0 lb

## 2018-06-06 DIAGNOSIS — E538 Deficiency of other specified B group vitamins: Secondary | ICD-10-CM | POA: Diagnosis not present

## 2018-06-06 DIAGNOSIS — E038 Other specified hypothyroidism: Secondary | ICD-10-CM

## 2018-06-06 DIAGNOSIS — E063 Autoimmune thyroiditis: Secondary | ICD-10-CM | POA: Diagnosis not present

## 2018-06-06 DIAGNOSIS — E271 Primary adrenocortical insufficiency: Secondary | ICD-10-CM

## 2018-06-06 MED ORDER — DEXAMETHASONE 0.5 MG PO TABS: 0.2500 mg | ORAL_TABLET | Freq: Every day | ORAL | 5 refills | Status: DC

## 2018-06-06 NOTE — Patient Instructions (Addendum)
blood tests are requested for you today.  We'll let you know about the results. I have sent a prescription to your pharmacy, to resume the dexamethasone at 1/2 pill per day.   Please come back for a follow-up appointment in 6 months.

## 2018-06-06 NOTE — Progress Notes (Signed)
Subjective:    Patient ID: Natalie Mullen, female    DOB: 08/02/1966, 52 y.o.   MRN: [id number]  HPI  The state of at least three ongoing medical problems is addressed today, with interval history of each noted here: Pt returns for fu of Addison's Dz (dx'ed 2016, when she presented with diffuse hyperpigmentation; she did not tolerate prednisone (skin sensation)).  She takes florinef but not the decadron.  pt states she feels well in general, except skin darkening is worse recently.  This is a stable problem. Hypothyroidism: she takes synthroid 112/d.  This is a stable problem. B-12 deficiency: she does not take B-12, but he feels well.  This is a stable problem. Past Medical History:  Diagnosis Date  . 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 Anticardiolipin antibody positive    sees Dr. Cyndie Chime  . Antiphospholipid antibody with hypercoagulable state (HCC) 03/27/2012   Initial presentation 05/2010 with acute left MCA stroke  . Autoimmune hepatitis [facility])    sees Dr. Daun Peacock   . Chronic anticoagulation 11/05/2013  . Dysphagia as late effect of stroke 03/27/2012   Possible component of reflux esophagitis  . Hypothyroid   . 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 APPENDECTOMY    . CESAREAN SECTION     3  . COLONOSCOPY  01/30/2015   per Dr. Kinnie Scales, clear, repeat in 10 yrs   . LASIK    . TONSILLECTOMY      Social History   Socioeconomic History  . Marital status: Divorced    Spouse name: Not on file  . Number of children: 3  . Years of education: Masters  . Highest education level: Not on file  Occupational History  . Occupation: Research scientist (physical sciences) - constuction  Social Needs  . Financial resource strain: Not on file  . Food insecurity:    Worry: Not on file   Inability: Not on file  . Transportation needs:    Medical: Not on file    Non-medical: Not on file  Tobacco Use  . Smoking status: Former Smoker    Types: Cigarettes    Last attempt to quit: 06/17/2010    Years since quitting: 7.9  . Smokeless tobacco: Never Used  Substance and Sexual Activity  . Alcohol use: No    Alcohol/week: 0.0 oz  . Drug use: No  . Sexual activity: Not on file  Lifestyle  . Physical activity:    Days per week: Not on file    Minutes per session: Not on file  . Stress: Not on file  Relationships  . Social connections:    Talks on phone: Not on file    Gets together: Not on file    Attends religious service: Not on file    Active member of club or organization: Not on file    Attends meetings of clubs or organizations: Not on file    Relationship status: Not on file  . Intimate partner violence:    Fear of current or ex partner: Not on file    Emotionally abused: Not on file    Physically abused: Not on file    Forced sexual activity: Not on file  Other Topics Concern  . Not on file  Social History Narrative   Lives at home  alone.   [place]   Avid tennis player   Occasional caffeine use.    Current Outpatient Medications on File Prior to Visit  Medication Sig Dispense Refill  . aspirin EC 81 MG tablet Take 81 mg by mouth daily.    . Cholecalciferol (VITAMIN D PO) Take by mouth every other day.    . ferrous sulfate 325 (65 FE) MG EC tablet Take 325 mg by mouth daily with breakfast.     . fludrocortisone (FLORINEF) 0.1 MG tablet Take 1 tablet (0.1 mg total) by mouth daily. 90 tablet 3   No current facility-administered medications on file prior to visit.     No Known Allergies  Family History  Problem Relation Age of Onset  . Heart disease Father   . Arthritis Unknown   . Stroke Unknown   . Other Mother        Accident - had a fall    BP 110/64   Pulse (!) 59   Ht 5\' 7"  (1.702 m)   Wt 139 lb (63 kg)    LMP 02/02/2016   SpO2 93%   BMI 21.77 kg/m   Review of Systems Denies foot numbness.  No weight change    Objective:   Physical Exam VITAL SIGNS:  See vs page GENERAL: no distress NECK: There is no palpable thyroid enlargement.  No thyroid nodule is palpable.  No palpable lymphadenopathy at the anterior neck.      Assessment & Plan:  B-12 deficiency: recheck today Addison's Dz: we discussed.  Pt will resume low dosage decadron Hypothyroidism: recheck today  Patient Instructions  blood tests are requested for you today.  We'll let you know about the results. I have sent a prescription to your pharmacy, to resume the dexamethasone at 1/2 pill per day.   Please come back for a follow-up appointment in 6 months.

## 2018-06-07 ENCOUNTER — Other Ambulatory Visit (INDEPENDENT_AMBULATORY_CARE_PROVIDER_SITE_OTHER): Payer: BLUE CROSS/BLUE SHIELD

## 2018-06-07 DIAGNOSIS — E271 Primary adrenocortical insufficiency: Secondary | ICD-10-CM | POA: Diagnosis not present

## 2018-06-07 DIAGNOSIS — E538 Deficiency of other specified B group vitamins: Secondary | ICD-10-CM

## 2018-06-07 DIAGNOSIS — E063 Autoimmune thyroiditis: Secondary | ICD-10-CM | POA: Diagnosis not present

## 2018-06-07 DIAGNOSIS — E038 Other specified hypothyroidism: Secondary | ICD-10-CM | POA: Diagnosis not present

## 2018-06-07 LAB — BASIC METABOLIC PANEL
BUN: 16 mg/dL (ref 6–23)
CHLORIDE: 106 meq/L (ref 96–112)
CO2: 26 meq/L (ref 19–32)
CREATININE: 0.88 mg/dL (ref 0.40–1.20)
Calcium: 9.6 mg/dL (ref 8.4–10.5)
GFR: 71.1 mL/min (ref >60.00)
Glucose, Bld: 91 mg/dL (ref 70–99)
Potassium: 4.1 mEq/L (ref 3.5–5.1)
Sodium: 138 mEq/L (ref 135–145)

## 2018-06-07 LAB — T4, FREE: FREE T4: 1.62 ng/dL — AB (ref 0.60–1.60)

## 2018-06-07 LAB — TSH: TSH: 0.03 u[IU]/mL — ABNORMAL LOW (ref 0.35–4.50)

## 2018-06-07 LAB — VITAMIN B12: VITAMIN B 12: 174 pg/mL — AB (ref 211–911)

## 2018-06-07 MED ORDER — CYANOCOBALAMIN 1000 MCG/ML IJ SOLN: 1000.0000 ug | INTRAMUSCULAR | 0 refills | Status: DC

## 2018-06-07 MED ORDER — LEVOTHYROXINE SODIUM 100 MCG PO TABS: 100.0000 ug | ORAL_TABLET | Freq: Every day | ORAL | 3 refills | Status: DC

## 2018-06-08 ENCOUNTER — Telehealth: Payer: Self-pay | Admitting: Emergency Medicine

## 2018-06-08 NOTE — Telephone Encounter (Signed)
Pt called and stated she has some questions about her B12 and would like you to give her a call back. Thanks.

## 2018-06-12 NOTE — Telephone Encounter (Signed)
I called patient & reviewed lab results. I have made patient first nurse B12 for tomorrow. Dr. Everardo AllEllison wants them done for 6 months.

## 2018-06-13 ENCOUNTER — Ambulatory Visit: Payer: BLUE CROSS/BLUE SHIELD

## 2018-06-14 ENCOUNTER — Ambulatory Visit (INDEPENDENT_AMBULATORY_CARE_PROVIDER_SITE_OTHER): Payer: BLUE CROSS/BLUE SHIELD

## 2018-06-14 DIAGNOSIS — E538 Deficiency of other specified B group vitamins: Secondary | ICD-10-CM

## 2018-06-14 MED ORDER — CYANOCOBALAMIN 1000 MCG/ML IJ SOLN
1000.0000 ug | Freq: Once | INTRAMUSCULAR | Status: AC
  Administered 2018-06-14: 1000 ug via INTRAMUSCULAR

## 2018-06-14 NOTE — Progress Notes (Signed)
Per orders of Dr. Everardo AllEllison injection of b-12 was given today by L. Margarite Vessel CMA . Patient tolerated injection well.

## 2018-06-22 DIAGNOSIS — D225 Melanocytic nevi of trunk: Secondary | ICD-10-CM | POA: Diagnosis not present

## 2018-06-22 DIAGNOSIS — L821 Other seborrheic keratosis: Secondary | ICD-10-CM | POA: Diagnosis not present

## 2018-06-22 DIAGNOSIS — L814 Other melanin hyperpigmentation: Secondary | ICD-10-CM | POA: Diagnosis not present

## 2018-07-06 DIAGNOSIS — N766 Ulceration of vulva: Secondary | ICD-10-CM | POA: Diagnosis not present

## 2018-07-06 DIAGNOSIS — N909 Noninflammatory disorder of vulva and perineum, unspecified: Secondary | ICD-10-CM | POA: Diagnosis not present

## 2018-07-10 ENCOUNTER — Other Ambulatory Visit (INDEPENDENT_AMBULATORY_CARE_PROVIDER_SITE_OTHER): Payer: BLUE CROSS/BLUE SHIELD

## 2018-07-10 DIAGNOSIS — E063 Autoimmune thyroiditis: Secondary | ICD-10-CM

## 2018-07-10 DIAGNOSIS — E038 Other specified hypothyroidism: Secondary | ICD-10-CM

## 2018-07-10 LAB — TSH: TSH: 13.45 u[IU]/mL — ABNORMAL HIGH (ref 0.35–4.50)

## 2018-07-10 LAB — T4, FREE: FREE T4: 0.69 ng/dL (ref 0.60–1.60)

## 2018-07-11 ENCOUNTER — Other Ambulatory Visit: Payer: Self-pay | Admitting: Endocrinology

## 2018-07-11 MED ORDER — LEVOTHYROXINE SODIUM 112 MCG PO TABS: 112.0000 ug | ORAL_TABLET | Freq: Every day | ORAL | 3 refills | Status: DC

## 2018-07-18 ENCOUNTER — Ambulatory Visit: Payer: BLUE CROSS/BLUE SHIELD

## 2018-08-10 DIAGNOSIS — D1039 Benign neoplasm of other parts of mouth: Secondary | ICD-10-CM | POA: Diagnosis not present

## 2018-08-17 DIAGNOSIS — D1039 Benign neoplasm of other parts of mouth: Secondary | ICD-10-CM | POA: Diagnosis not present

## 2018-08-18 ENCOUNTER — Other Ambulatory Visit (INDEPENDENT_AMBULATORY_CARE_PROVIDER_SITE_OTHER): Payer: BLUE CROSS/BLUE SHIELD

## 2018-08-18 ENCOUNTER — Other Ambulatory Visit: Payer: Self-pay | Admitting: Endocrinology

## 2018-08-18 DIAGNOSIS — E038 Other specified hypothyroidism: Secondary | ICD-10-CM

## 2018-08-18 DIAGNOSIS — E063 Autoimmune thyroiditis: Secondary | ICD-10-CM

## 2018-08-18 LAB — T4, FREE: FREE T4: 0.95 ng/dL (ref 0.60–1.60)

## 2018-08-18 LAB — TSH: TSH: 8 u[IU]/mL — AB (ref 0.35–4.50)

## 2018-08-18 MED ORDER — LEVOTHYROXINE SODIUM 125 MCG PO TABS: 125.0000 ug | ORAL_TABLET | Freq: Every day | ORAL | 3 refills | Status: DC

## 2018-08-30 DIAGNOSIS — H5213 Myopia, bilateral: Secondary | ICD-10-CM | POA: Diagnosis not present

## 2018-08-30 DIAGNOSIS — H04123 Dry eye syndrome of bilateral lacrimal glands: Secondary | ICD-10-CM | POA: Diagnosis not present

## 2018-11-02 ENCOUNTER — Other Ambulatory Visit: Payer: Self-pay | Admitting: Oncology

## 2018-11-02 DIAGNOSIS — D6861 Antiphospholipid syndrome: Secondary | ICD-10-CM

## 2018-11-02 DIAGNOSIS — I639 Cerebral infarction, unspecified: Secondary | ICD-10-CM

## 2018-11-02 DIAGNOSIS — Z7901 Long term (current) use of anticoagulants: Secondary | ICD-10-CM

## 2018-12-04 ENCOUNTER — Other Ambulatory Visit: Payer: Self-pay | Admitting: Endocrinology

## 2018-12-04 ENCOUNTER — Other Ambulatory Visit: Payer: Self-pay

## 2018-12-04 ENCOUNTER — Ambulatory Visit: Payer: BLUE CROSS/BLUE SHIELD | Admitting: Endocrinology

## 2018-12-04 MED ORDER — FLUDROCORTISONE ACETATE 0.1 MG PO TABS: 0.1000 mg | ORAL_TABLET | Freq: Every day | ORAL | 0 refills | Status: DC

## 2018-12-04 NOTE — Telephone Encounter (Signed)
Please advise if refill is appropriate 

## 2018-12-05 ENCOUNTER — Other Ambulatory Visit: Payer: Self-pay

## 2018-12-05 MED ORDER — DEXAMETHASONE 0.5 MG PO TABS: 0.2500 mg | ORAL_TABLET | Freq: Every day | ORAL | 5 refills | Status: DC

## 2018-12-19 ENCOUNTER — Ambulatory Visit (INDEPENDENT_AMBULATORY_CARE_PROVIDER_SITE_OTHER): Payer: BLUE CROSS/BLUE SHIELD | Admitting: Endocrinology

## 2018-12-19 ENCOUNTER — Encounter: Payer: Self-pay | Admitting: Endocrinology

## 2018-12-19 VITALS — BP 122/70 | HR 61 | Ht 67.0 in | Wt 139.6 lb

## 2018-12-19 DIAGNOSIS — E271 Primary adrenocortical insufficiency: Secondary | ICD-10-CM

## 2018-12-19 DIAGNOSIS — E063 Autoimmune thyroiditis: Secondary | ICD-10-CM | POA: Diagnosis not present

## 2018-12-19 DIAGNOSIS — E038 Other specified hypothyroidism: Secondary | ICD-10-CM

## 2018-12-20 LAB — BASIC METABOLIC PANEL
BUN: 16 mg/dL (ref 6–23)
CO2: 23 mEq/L (ref 19–32)
Calcium: 10.1 mg/dL (ref 8.4–10.5)
Chloride: 101 mEq/L (ref 96–112)
Creatinine, Ser: 0.89 mg/dL (ref 0.40–1.20)
GFR: 65.9 mL/min (ref >60.00)
Glucose, Bld: 89 mg/dL (ref 70–99)
Potassium: 4.2 mEq/L (ref 3.5–5.1)
Sodium: 134 mEq/L — ABNORMAL LOW (ref 135–145)

## 2018-12-20 LAB — TSH: TSH: 9.43 u[IU]/mL — AB (ref 0.35–4.50)

## 2018-12-20 MED ORDER — LEVOTHYROXINE SODIUM 150 MCG PO TABS: 150.0000 ug | ORAL_TABLET | Freq: Every day | ORAL | 3 refills | Status: DC

## 2018-12-20 NOTE — Progress Notes (Signed)
Subjective:    Patient ID: Natalie Mullen, female    DOB: [birth year], 53 y.o.   MRN: [id number]  HPI Pt returns for fu of Addison's Dz (dx'ed 2016, when she presented with diffuse hyperpigmentation; she did not tolerate prednisone (skin sensation)).  She takes meds as rx'ed.  pt states she feels well in general, except skin darkening is less recently.   Hypothyroidism: she takes synthroid 112/d.   Past Medical History:  Diagnosis Date  . 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 Anticardiolipin antibody positive    sees Dr. Cyndie Chime  . Antiphospholipid antibody with hypercoagulable state (HCC) 03/27/2012   Initial presentation 05/2010 with acute left MCA stroke  . Autoimmune hepatitis [facility])    sees Dr. Daun Peacock   . Chronic anticoagulation 11/05/2013  . Dysphagia as late effect of stroke 03/27/2012   Possible component of reflux esophagitis  . Hypothyroid   . 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 APPENDECTOMY    . CESAREAN SECTION     3  . COLONOSCOPY  01/30/2015   per Dr. Kinnie Scales, clear, repeat in 10 yrs   . LASIK    . TONSILLECTOMY      Social History   Socioeconomic History  . Marital status: Divorced    Spouse name: Not on file  . Number of children: 3  . Years of education: Masters  . Highest education level: Not on file  Occupational History  . Occupation: Research scientist (physical sciences) - constuction  Social Needs  . Financial resource strain: Not on file  . Food insecurity:    Worry: Not on file    Inability: Not on file  . Transportation needs:    Medical: Not on file    Non-medical: Not on file  Tobacco Use  . Smoking status: Former Smoker    Types: Cigarettes    Last attempt to quit: 06/17/2010    Years since quitting: 8.5  . Smokeless tobacco: Never Used   Substance and Sexual Activity  . Alcohol use: No    Alcohol/week: 0.0 standard drinks  . Drug use: No  . Sexual activity: Not on file  Lifestyle  . Physical activity:    Days per week: Not on file    Minutes per session: Not on file  . Stress: Not on file  Relationships  . Social connections:    Talks on phone: Not on file    Gets together: Not on file    Attends religious service: Not on file    Active member of club or organization: Not on file    Attends meetings of clubs or organizations: Not on file    Relationship status: Not on file  . Intimate partner violence:    Fear of current or ex partner: Not on file    Emotionally abused: Not on file    Physically abused: Not on file    Forced sexual activity: Not on file  Other Topics Concern  . Not on file  Social History Narrative   Lives at home alone.   Originally from [place]   Avid tennis player   Occasional caffeine use.    Current Outpatient Medications on File Prior to Visit  Medication Sig Dispense Refill  . Cholecalciferol (VITAMIN D PO) Take by mouth every  other day.    . cyanocobalamin (,VITAMIN B-12,) 1000 MCG/ML injection Inject 1 mL (1,000 mcg total) into the muscle every 30 (thirty) days. 1 mL 0  . dexamethasone (DECADRON) 0.5 MG tablet Take 0.5 tablets (0.25 mg total) by mouth daily. 30 tablet 5  . ferrous sulfate 325 (65 FE) MG EC tablet Take 325 mg by mouth daily with breakfast.     . fludrocortisone (FLORINEF) 0.1 MG tablet Take 1 tablet (0.1 mg total) by mouth daily. 30 tablet 0   No current facility-administered medications on file prior to visit.     No Known Allergies  Family History  Problem Relation Age of Onset  . Heart disease Father   . Arthritis Unknown   . Stroke Unknown   . Other Mother        Accident - had a fall    BP 122/70 (BP Location: Right Arm, Patient Position: Sitting, Cuff Size: Normal)   Pulse 61   Ht 5\' 7"  (1.702 m)   Wt 139 lb 9.6 oz (63.3 kg)    LMP 02/02/2016   SpO2 98%   BMI 21.86 kg/m    Review of Systems Denies excessive diaphoresis    Objective:   Physical Exam VITAL SIGNS:  See vs page GENERAL: no distress NECK: There is no palpable thyroid enlargement.  No thyroid nodule is palpable.  No palpable lymphadenopathy at the anterior neck.  Lab Results  Component Value Date   TSH 9.43 (H) 12/19/2018   Lab Results  Component Value Date   CREATININE 0.89 12/19/2018   BUN 16 12/19/2018   NA 134 (L) 12/19/2018   K 4.2 12/19/2018   CL 101 12/19/2018   CO2 23 12/19/2018      Assessment & Plan:  Hypothyroidism: she needs increased rx Addison's Dz: well-controlled Same rx for addison's.  Increase synthroid No avs due to computer outage.

## 2019-01-22 ENCOUNTER — Encounter: Payer: Self-pay | Admitting: *Deleted

## 2019-01-27 ENCOUNTER — Other Ambulatory Visit: Payer: Self-pay | Admitting: Endocrinology

## 2019-02-22 ENCOUNTER — Telehealth: Payer: Self-pay | Admitting: Hematology

## 2019-02-22 ENCOUNTER — Encounter: Payer: Self-pay | Admitting: Hematology

## 2019-02-22 NOTE — Telephone Encounter (Signed)
A new hem appt has been scheduled for the pt to see Dr. Candise Che on 5/14 at 1pm. Letter mailed.

## 2019-04-05 ENCOUNTER — Encounter: Payer: BLUE CROSS/BLUE SHIELD | Admitting: Hematology

## 2019-04-05 ENCOUNTER — Other Ambulatory Visit: Payer: BLUE CROSS/BLUE SHIELD

## 2019-04-05 NOTE — Addendum Note (Signed)
Addended by: Neomia Dear on: 04/05/2019 05:56 PM   Modules accepted: Orders

## 2019-04-25 ENCOUNTER — Encounter: Payer: Self-pay | Admitting: Family Medicine

## 2019-04-25 ENCOUNTER — Ambulatory Visit (INDEPENDENT_AMBULATORY_CARE_PROVIDER_SITE_OTHER): Payer: BLUE CROSS/BLUE SHIELD | Admitting: Family Medicine

## 2019-04-25 ENCOUNTER — Other Ambulatory Visit: Payer: Self-pay

## 2019-04-25 DIAGNOSIS — K130 Diseases of lips: Secondary | ICD-10-CM

## 2019-04-25 MED ORDER — AMOXICILLIN-POT CLAVULANATE 875-125 MG PO TABS: 1.0000 | ORAL_TABLET | Freq: Two times a day (BID) | ORAL | 0 refills | Status: DC

## 2019-04-25 NOTE — Progress Notes (Signed)
Subjective:    Patient ID: Natalie Mullen, female    DOB: Jan 04, 1966, 53 y.o.   MRN: [id number]  HPI Virtual Visit via Video Note  I connected with the patient on 04/25/19 at  3:45 PM EDT by a video enabled telemedicine application and verified that I am speaking with the correct person using two identifiers.  Location patient: home Location provider:work or home office Persons participating in the virtual visit: patient, provider  I discussed the limitations of evaluation and management by telemedicine and the availability of in person appointments. The patient expressed understanding and agreed to proceed.   HPI: Here for infection in the upper lip. About 3 weeks ago her cat accidentally scratched her upper lip, and it became swollen and tender. No fevers. No swollen lymph nodes in the neck. She had a telehealth visit with someone and they prescribed Mupiricin ointment. However the lip has not improved. She says the lip is diffusely swollen, and she does not feel a distinct lump within the lip, so I think an abscess is unlikely.    ROS: See pertinent positives and negatives per HPI.  Past Medical History:  Diagnosis Date  . 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 Anticardiolipin antibody positive    sees Dr. Cyndie Chime  . Antiphospholipid antibody with hypercoagulable state (HCC) 03/27/2012   Initial presentation 05/2010 with acute left MCA stroke  . Autoimmune hepatitis [facility])    sees Dr. Daun Peacock   . Chronic anticoagulation 11/05/2013  . Dysphagia as late effect of stroke 03/27/2012   Possible component of reflux esophagitis  . Hypothyroid   . 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 APPENDECTOMY    . CESAREAN SECTION     3  . COLONOSCOPY  01/30/2015   per  Dr. Kinnie Scales, clear, repeat in 10 yrs   . LASIK    . TONSILLECTOMY      Family History  Problem Relation Age of Onset  . Heart disease Father   . Arthritis Unknown   . Stroke Unknown   . Other Mother        Accident - had a fall     Current Outpatient Medications:  .  amoxicillin-clavulanate (AUGMENTIN) 875-125 MG tablet, Take 1 tablet by mouth 2 (two) times daily., Disp: 20 tablet, Rfl: 0 .  Cholecalciferol (VITAMIN D PO), Take by mouth every other day., Disp: , Rfl:  .  cyanocobalamin (,VITAMIN B-12,) 1000 MCG/ML injection, Inject 1 mL (1,000 mcg total) into the muscle every 30 (thirty) days., Disp: 1 mL, Rfl: 0 .  dexamethasone (DECADRON) 0.5 MG tablet, TAKE 1 TABLET(0.5 MG) BY MOUTH DAILY, Disp: 90 tablet, Rfl: 3 .  ferrous sulfate 325 (65 FE) MG EC tablet, Take 325 mg by mouth daily with breakfast. , Disp: , Rfl:  .  fludrocortisone (FLORINEF) 0.1 MG tablet, Take 1 tablet (0.1 mg total) by mouth daily., Disp: 30 tablet, Rfl: 0 .  levothyroxine (SYNTHROID, LEVOTHROID) 150 MCG tablet, Take 1 tablet (150 mcg total) by mouth daily before breakfast., Disp: 90 tablet, Rfl: 3  EXAM:  VITALS per patient if applicable:  GENERAL: alert, oriented, appears well and in no acute distress  HEENT: atraumatic, conjunttiva clear, no obvious abnormalities on inspection of external nose and ears. The left side of the upper  is pink and mildly swollen   NECK: normal movements of the head and neck  LUNGS: on inspection no signs of respiratory distress, breathing rate appears normal, no obvious gross SOB, gasping or wheezing  CV: no obvious cyanosis  MS: moves all visible extremities without noticeable abnormality  PSYCH/NEURO: pleasant and cooperative, no obvious depression or anxiety, speech and thought processing grossly intact  ASSESSMENT AND PLAN: Cellulitis of the lip following a cat scratch. We will treat with Augmentin for 10 days. Recheck prn. Gershon CraneStephen , MD  Discussed the  following assessment and plan:  No diagnosis found.     I discussed the assessment and treatment plan with the patient. The patient was provided an opportunity to ask questions and all were answered. The patient agreed with the plan and demonstrated an understanding of the instructions.   The patient was advised to call back or seek an in-person evaluation if the symptoms worsen or if the condition fails to improve as anticipated.     Review of Systems     Objective:   Physical Exam        Assessment & Plan:

## 2019-05-07 DIAGNOSIS — L309 Dermatitis, unspecified: Secondary | ICD-10-CM | POA: Diagnosis not present

## 2019-05-07 DIAGNOSIS — L810 Postinflammatory hyperpigmentation: Secondary | ICD-10-CM | POA: Diagnosis not present

## 2019-05-18 DIAGNOSIS — K130 Diseases of lips: Secondary | ICD-10-CM | POA: Diagnosis not present

## 2019-05-18 DIAGNOSIS — L0889 Other specified local infections of the skin and subcutaneous tissue: Secondary | ICD-10-CM | POA: Diagnosis not present

## 2019-08-14 ENCOUNTER — Other Ambulatory Visit: Payer: Self-pay

## 2019-08-14 DIAGNOSIS — E271 Primary adrenocortical insufficiency: Secondary | ICD-10-CM

## 2019-08-14 DIAGNOSIS — E063 Autoimmune thyroiditis: Secondary | ICD-10-CM

## 2019-08-14 DIAGNOSIS — E038 Other specified hypothyroidism: Secondary | ICD-10-CM

## 2019-08-14 MED ORDER — FLUDROCORTISONE ACETATE 0.1 MG PO TABS: 0.1000 mg | ORAL_TABLET | Freq: Every day | ORAL | 2 refills | Status: DC

## 2019-08-23 ENCOUNTER — Telehealth (INDEPENDENT_AMBULATORY_CARE_PROVIDER_SITE_OTHER): Payer: BLUE CROSS/BLUE SHIELD | Admitting: Family Medicine

## 2019-08-23 ENCOUNTER — Encounter: Payer: Self-pay | Admitting: Family Medicine

## 2019-08-23 ENCOUNTER — Other Ambulatory Visit: Payer: Self-pay

## 2019-08-23 DIAGNOSIS — H00011 Hordeolum externum right upper eyelid: Secondary | ICD-10-CM

## 2019-08-23 MED ORDER — ERYTHROMYCIN 5 MG/GM OP OINT: 1.0000 "application " | TOPICAL_OINTMENT | Freq: Four times a day (QID) | OPHTHALMIC | 0 refills | Status: DC

## 2019-08-23 NOTE — Progress Notes (Signed)
Virtual Visit via Video Note  I connected with the patient on 08/23/19 at 11:15 AM EDT by a video enabled telemedicine application and verified that I am speaking with the correct person using two identifiers.  Location patient: home Location provider:work or home office Persons participating in the virtual visit: patient, provider  I discussed the limitations of evaluation and management by telemedicine and the availability of in person appointments. The patient expressed understanding and agreed to proceed.   HPI: Here for 2 days of soreness and swelling in the right upper eyelid. No other symptoms. She can feel a discrete lump at the lid edge.    ROS: See pertinent positives and negatives per HPI.  Past Medical History:  Diagnosis Date  . 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 Anticardiolipin antibody positive    sees Dr. Cyndie Chime  . Antiphospholipid antibody with hypercoagulable state (HCC) 03/27/2012   Initial presentation 05/2010 with acute left MCA stroke  . Autoimmune hepatitis [facility])    sees Dr. Daun Peacock   . Chronic anticoagulation 11/05/2013  . Dysphagia as late effect of stroke 03/27/2012   Possible component of reflux esophagitis  . Hypothyroid   . 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 APPENDECTOMY    . CESAREAN SECTION     3  . COLONOSCOPY  01/30/2015   per Dr. Kinnie Scales, clear, repeat in 10 yrs   . LASIK    . TONSILLECTOMY      Family History  Problem Relation Age of Onset  . Heart disease Father   . Arthritis Unknown   . Stroke Unknown   . Other Mother        Accident - had a fall     Current Outpatient Medications:  .  amoxicillin-clavulanate (AUGMENTIN) 875-125 MG tablet, Take 1 tablet by mouth 2 (two) times daily., Disp: 20  tablet, Rfl: 0 .  Cholecalciferol (VITAMIN D PO), Take by mouth every other day., Disp: , Rfl:  .  cyanocobalamin (,VITAMIN B-12,) 1000 MCG/ML injection, Inject 1 mL (1,000 mcg total) into the muscle every 30 (thirty) days., Disp: 1 mL, Rfl: 0 .  dexamethasone (DECADRON) 0.5 MG tablet, TAKE 1 TABLET(0.5 MG) BY MOUTH DAILY, Disp: 90 tablet, Rfl: 3 .  erythromycin ophthalmic ointment, Place 1 application into the right eye 4 (four) times daily., Disp: 3.5 g, Rfl: 0 .  ferrous sulfate 325 (65 FE) MG EC tablet, Take 325 mg by mouth daily with breakfast. , Disp: , Rfl:  .  fludrocortisone (FLORINEF) 0.1 MG tablet, Take 1 tablet (0.1 mg total) by mouth daily., Disp: 30 tablet, Rfl: 2 .  levothyroxine (SYNTHROID, LEVOTHROID) 150 MCG tablet, Take 1 tablet (150 mcg total) by mouth daily before breakfast., Disp: 90 tablet, Rfl: 3  EXAM:  VITALS per patient if applicable:  GENERAL: alert, oriented, appears well and in no acute distress  HEENT: atraumatic, conjunttiva clear, no obvious abnormalities on inspection of external nose and ears  NECK: normal movements of the head and neck  LUNGS: on inspection no signs of respiratory distress, breathing rate appears normal, no obvious gross SOB, gasping or wheezing  CV: no obvious cyanosis  MS: moves all visible extremities without noticeable abnormality  PSYCH/NEURO: pleasant and cooperative, no obvious depression or anxiety, speech and thought processing grossly intact  ASSESSMENT AND PLAN: Stye, treat with Erythromycin ophthalmic ointment and warm compresses.  Alysia Penna, MD  Discussed the following assessment and plan:  No diagnosis found.     I discussed the assessment and treatment plan with the patient. The patient was provided an opportunity to ask questions and all were answered. The patient agreed with the plan and demonstrated an understanding of the instructions.   The patient was advised to call back or seek an in-person evaluation  if the symptoms worsen or if the condition fails to improve as anticipated.

## 2019-09-05 DIAGNOSIS — H01001 Unspecified blepharitis right upper eyelid: Secondary | ICD-10-CM | POA: Diagnosis not present

## 2019-09-05 DIAGNOSIS — H5213 Myopia, bilateral: Secondary | ICD-10-CM | POA: Diagnosis not present

## 2019-09-18 ENCOUNTER — Other Ambulatory Visit: Payer: Self-pay

## 2019-09-18 DIAGNOSIS — E063 Autoimmune thyroiditis: Secondary | ICD-10-CM

## 2019-09-18 DIAGNOSIS — E038 Other specified hypothyroidism: Secondary | ICD-10-CM

## 2019-09-18 MED ORDER — LEVOTHYROXINE SODIUM 150 MCG PO TABS: 150.0000 ug | ORAL_TABLET | Freq: Every day | ORAL | 3 refills | Status: DC

## 2019-12-25 ENCOUNTER — Ambulatory Visit (INDEPENDENT_AMBULATORY_CARE_PROVIDER_SITE_OTHER): Payer: 59 | Admitting: Endocrinology

## 2019-12-25 ENCOUNTER — Other Ambulatory Visit: Payer: Self-pay

## 2019-12-25 ENCOUNTER — Encounter: Payer: Self-pay | Admitting: Endocrinology

## 2019-12-25 VITALS — BP 120/80 | HR 64 | Ht 67.0 in | Wt 136.0 lb

## 2019-12-25 DIAGNOSIS — E063 Autoimmune thyroiditis: Secondary | ICD-10-CM

## 2019-12-25 DIAGNOSIS — E271 Primary adrenocortical insufficiency: Secondary | ICD-10-CM

## 2019-12-25 DIAGNOSIS — E038 Other specified hypothyroidism: Secondary | ICD-10-CM | POA: Diagnosis not present

## 2019-12-25 MED ORDER — FLUDROCORTISONE ACETATE 0.1 MG PO TABS: 0.1000 mg | ORAL_TABLET | Freq: Every day | ORAL | 2 refills | Status: DC

## 2019-12-25 NOTE — Progress Notes (Signed)
Subjective:    Patient ID: Natalie Mullen, female    DOB: 08/11/1966, 54 y.o.   MRN: [id number]  HPI Pt returns for fu of Addison's Dz (dx'ed 2016, when she presented with diffuse hyperpigmentation; she did not tolerate prednisone (skin sensation)).  She takes meds as rx'ed.  pt states she feels well in general, except skin darkening is less recently.   She also has hypothyroidism (dx'ed 2011): she takes synthroid 112/d.   Past Medical History:  Diagnosis Date  . 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 Anticardiolipin antibody positive    sees Dr. Beryle Beams  . Antiphospholipid antibody with hypercoagulable state ([place]) 03/27/2012   Initial presentation 05/2010 with acute left MCA stroke  . Autoimmune hepatitis [facility])    sees Dr. Hildred Alamin   . Chronic anticoagulation 11/05/2013  . Dysphagia as late effect of stroke 03/27/2012   Possible component of reflux esophagitis  . Hypothyroid   . 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 APPENDECTOMY    . CESAREAN SECTION     3  . COLONOSCOPY  01/30/2015   per Dr. Earlean Shawl, clear, repeat in 10 yrs   . LASIK    . TONSILLECTOMY      Social History   Socioeconomic History  . Marital status: Divorced    Spouse name: Not on file  . Number of children: 3  . Years of education: Masters  . Highest education level: Not on file  Occupational History  . Occupation: Engineering geologist - constuction  Tobacco Use  . Smoking status: Former Smoker    Types: Cigarettes    Quit date: 06/17/2010    Years since quitting: 9.5  . Smokeless tobacco: Never Used  Substance and Sexual Activity  . Alcohol use: No    Alcohol/week: 0.0 standard drinks  . Drug use: No  . Sexual activity: Not on file  Other Topics Concern  . Not on file  Social  History Narrative   Lives at home alone.   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   Social Determinants of Health   Financial Resource Strain:   . Difficulty of Paying Living Expenses: Not on file  Food Insecurity:   . Worried About Charity fundraiser in the Last Year: Not on file  . Ran Out of Food in the Last Year: Not on file  Transportation Needs:   . Lack of Transportation (Medical): Not on file  . Lack of Transportation (Non-Medical): Not on file  Physical Activity:   . Days of Exercise per Week: Not on file  . Minutes of Exercise per Session: Not on file  Stress:   . Feeling of Stress : Not on file  Social Connections:   . Frequency of Communication with Friends and Family: Not on file  . Frequency of Social Gatherings with Friends and Family: Not on file  . Attends Religious Services: Not on file  . Active Member of Clubs or Organizations: Not on file  . Attends Archivist Meetings: Not on file  . Marital Status: Not on file  Intimate Partner Violence:   . Fear of Current or Ex-Partner: Not on file  . Emotionally Abused: Not on file  . Physically Abused: Not on file  .  Sexually Abused: Not on file    Current Outpatient Medications on File Prior to Visit  Medication Sig Dispense Refill  . Cholecalciferol (VITAMIN D PO) Take by mouth every other day.    . cyanocobalamin (,VITAMIN B-12,) 1000 MCG/ML injection Inject 1 mL (1,000 mcg total) into the muscle every 30 (thirty) days. 1 mL 0  . erythromycin ophthalmic ointment Place 1 application into the right eye 4 (four) times daily. 3.5 g 0  . ferrous sulfate 325 (65 FE) MG EC tablet Take 325 mg by mouth daily with breakfast.      No current facility-administered medications on file prior to visit.    No Known Allergies  Family History  Problem Relation Age of Onset  . Heart disease Father   . Arthritis Unknown   . Stroke Unknown   . Other Mother         Accident - had a fall    BP 120/80 (BP Location: Left Arm, Patient Position: Sitting, Cuff Size: Normal)   Pulse 64   Ht 5\' 7"  (1.702 m)   Wt 136 lb (61.7 kg)   LMP 02/02/2016   SpO2 98%   BMI 21.30 kg/m    Review of Systems Denies diarrhea, weight change, numbness, vitiligo, and muscle weakness.     Objective:   Physical Exam VITAL SIGNS:  See vs page GENERAL: no distress NECK: There is no palpable thyroid enlargement.  No thyroid nodule is palpable.  No palpable lymphadenopathy at the anterior neck.  Ext: no leg swelling.    Lab Results  Component Value Date   TSH 10.82 (H) 12/25/2019   Lab Results  Component Value Date   CREATININE 0.96 12/25/2019   BUN 20 12/25/2019   NA 132 (L) 12/25/2019   K 4.6 12/25/2019   CL 99 12/25/2019   CO2 25 12/25/2019       Assessment & Plan:  Hyperthyroidism: worse Addison's Dz: clinically well-controlled. hyponatremia may be due to the above, so we'll recheck on increased synthroid  Patient Instructions  Blood tests are requested for you today.  We'll let you know about the results.  Please come back for a follow-up appointment in 1 year.

## 2019-12-25 NOTE — Patient Instructions (Addendum)
Blood tests are requested for you today.  We'll let you know about the results.  Please come back for a follow-up appointment in 1 year.    

## 2019-12-26 LAB — BASIC METABOLIC PANEL
BUN: 20 mg/dL (ref 6–23)
CO2: 25 mEq/L (ref 19–32)
Calcium: 10.3 mg/dL (ref 8.4–10.5)
Chloride: 99 mEq/L (ref 96–112)
Creatinine, Ser: 0.96 mg/dL (ref 0.40–1.20)
GFR: 60.16 mL/min (ref >60.00)
Glucose, Bld: 87 mg/dL (ref 70–99)
Potassium: 4.6 mEq/L (ref 3.5–5.1)
Sodium: 132 mEq/L — ABNORMAL LOW (ref 135–145)

## 2019-12-26 LAB — T4, FREE: Free T4: 0.83 ng/dL (ref 0.60–1.60)

## 2019-12-26 LAB — TSH: TSH: 10.82 u[IU]/mL — ABNORMAL HIGH (ref 0.35–4.50)

## 2019-12-26 LAB — VITAMIN B12: Vitamin B-12: 372 pg/mL (ref 211–911)

## 2019-12-26 MED ORDER — LEVOTHYROXINE SODIUM 175 MCG PO TABS: 175.0000 ug | ORAL_TABLET | Freq: Every day | ORAL | 3 refills | Status: DC

## 2019-12-27 MED ORDER — DEXAMETHASONE 0.5 MG PO TABS: 0.5000 mg | ORAL_TABLET | Freq: Every day | ORAL | 3 refills | Status: DC

## 2020-01-29 ENCOUNTER — Telehealth: Payer: Self-pay | Admitting: Family Medicine

## 2020-01-29 NOTE — Telephone Encounter (Signed)
Since she is not symptomatic, if she tests positive there is nothing for her to take. She would just need to quarantine for 10 days

## 2020-01-29 NOTE — Telephone Encounter (Signed)
Patient is aware of Dr. Claris Che message.

## 2020-01-29 NOTE — Telephone Encounter (Signed)
Pt's son tested positive for COVID on March 7 so the pt got tested yesterday but has not received the result yet. She stated that she is not expeirencing symptoms but neither is her son so he went to get retested with her yesterday. She would like advice on what she should do/take if she does test positive? Pt stated she is aware she will have to quarantine if tested positive.   Pt can be reached at [phone]

## 2020-05-13 ENCOUNTER — Telehealth: Payer: Self-pay | Admitting: Family Medicine

## 2020-05-13 NOTE — Telephone Encounter (Signed)
Pt is wondering if she is due for the shingle vaccine?   Pt can be reached at [phone]

## 2020-05-13 NOTE — Telephone Encounter (Signed)
Yes anyone over 50 ought to get it (at their pharmacy)

## 2020-05-13 NOTE — Telephone Encounter (Signed)
Please advise. I do not see that she has had a shingles vaccine

## 2020-05-20 ENCOUNTER — Ambulatory Visit: Payer: 59 | Admitting: Family Medicine

## 2020-08-17 ENCOUNTER — Other Ambulatory Visit: Payer: Self-pay | Admitting: Endocrinology

## 2020-08-17 DIAGNOSIS — E271 Primary adrenocortical insufficiency: Secondary | ICD-10-CM

## 2020-10-01 ENCOUNTER — Telehealth: Payer: Self-pay | Admitting: Endocrinology

## 2020-10-01 NOTE — Telephone Encounter (Signed)
Please advise 

## 2020-10-01 NOTE — Telephone Encounter (Signed)
Patient called to advise that her skin is noticeably darker with out reason.  Wants to know if it could be related to her Addison's Disease  Call back number [phone]

## 2020-10-01 NOTE — Telephone Encounter (Signed)
Yes, it is very possible.  Please move up next appt to next avail--we may need to check labs

## 2020-10-02 ENCOUNTER — Encounter: Payer: Self-pay | Admitting: Endocrinology

## 2020-10-02 ENCOUNTER — Ambulatory Visit: Payer: 59 | Admitting: Endocrinology

## 2020-10-02 ENCOUNTER — Other Ambulatory Visit: Payer: Self-pay

## 2020-10-02 VITALS — BP 110/68 | HR 48 | Wt 130.0 lb

## 2020-10-02 DIAGNOSIS — E038 Other specified hypothyroidism: Secondary | ICD-10-CM

## 2020-10-02 DIAGNOSIS — E271 Primary adrenocortical insufficiency: Secondary | ICD-10-CM

## 2020-10-02 DIAGNOSIS — E063 Autoimmune thyroiditis: Secondary | ICD-10-CM

## 2020-10-02 DIAGNOSIS — L819 Disorder of pigmentation, unspecified: Secondary | ICD-10-CM

## 2020-10-02 DIAGNOSIS — E538 Deficiency of other specified B group vitamins: Secondary | ICD-10-CM | POA: Diagnosis not present

## 2020-10-02 LAB — VITAMIN B12: Vitamin B-12: 373 pg/mL (ref 211–911)

## 2020-10-02 LAB — BASIC METABOLIC PANEL
BUN: 21 mg/dL (ref 6–23)
CO2: 28 mEq/L (ref 19–32)
Calcium: 9.7 mg/dL (ref 8.4–10.5)
Chloride: 104 mEq/L (ref 96–112)
Creatinine, Ser: 1.01 mg/dL (ref 0.40–1.20)
GFR: 62.2 mL/min (ref >60.00)
Glucose, Bld: 53 mg/dL — ABNORMAL LOW (ref 70–99)
Potassium: 4.1 mEq/L (ref 3.5–5.1)
Sodium: 137 mEq/L (ref 135–145)

## 2020-10-02 LAB — CORTISOL: Cortisol, Plasma: 1.6 ug/dL

## 2020-10-02 LAB — TSH: TSH: 0.88 u[IU]/mL (ref 0.35–4.50)

## 2020-10-02 LAB — T4, FREE: Free T4: 1.13 ng/dL (ref 0.60–1.60)

## 2020-10-02 NOTE — Telephone Encounter (Signed)
Notified pt with Dr. Everardo All message. Pt have an appointment today @ 1:00.

## 2020-10-02 NOTE — Patient Instructions (Signed)
Blood tests are requested for you today.  We'll let you know about the results.  It is best to never miss the medication.  However, if you do miss it, next best is to double up the next time.  Please come back for a follow-up appointment in 1 year.   

## 2020-10-02 NOTE — Progress Notes (Signed)
Subjective:    Patient ID: Natalie Mullen, female    DOB: [birth year], 54 y.o.   MRN: [id number]  HPI Pt returns for fu of Addison's Dz (dx'ed 2016, when she presented with diffuse hyperpigmentation; she did not tolerate prednisone (skin sensation)).  She takes meds as rx'ed.  pt states she feels well in general, except skin darkening is worse again recently.   She also has hypothyroidism (dx'ed 2011): she takes synthroid 112/d.   Past Medical History:  Diagnosis Date  . 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 Anticardiolipin antibody positive    sees Dr. Cyndie Chime  . Antiphospholipid antibody with hypercoagulable state ([facility]) 03/27/2012   Initial presentation 05/2010 with acute left MCA stroke  . Autoimmune hepatitis [facility])    sees Dr. Daun Peacock   . Chronic anticoagulation 11/05/2013  . Dysphagia as late effect of stroke 03/27/2012   Possible component of reflux esophagitis  . Hypothyroid   . 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 APPENDECTOMY    . CESAREAN SECTION     3  . COLONOSCOPY  01/30/2015   per Dr. Kinnie Scales, clear, repeat in 10 yrs   . LASIK    . TONSILLECTOMY      Social History   Socioeconomic History  . Marital status: Divorced    Spouse name: Not on file  . Number of children: 3  . Years of education: Masters  . Highest education level: Not on file  Occupational History  . Occupation: Research scientist (physical sciences) - constuction  Tobacco Use  . Smoking status: Former Smoker    Types: Cigarettes    Quit date: 06/17/2010    Years since quitting: 10.3  . Smokeless tobacco: Never Used  Substance and Sexual Activity  . Alcohol use: No    Alcohol/week: 0.0 standard drinks  . Drug use: No  . Sexual activity: Not on file  Other Topics Concern  . Not on file    Social History Narrative   Lives at home alone.   Originally from [place]   Avid tennis player   Occasional caffeine use.   Social Determinants of Health   Financial Resource Strain:   . Difficulty of Paying Living Expenses: Not on file  Food Insecurity:   . Worried About Programme researcher, broadcasting/film/video in the Last Year: Not on file  . Ran Out of Food in the Last Year: Not on file  Transportation Needs:   . Lack of Transportation (Medical): Not on file  . Lack of Transportation (Non-Medical): Not on file  Physical Activity:   . Days of Exercise per Week: Not on file  . Minutes of Exercise per Session: Not on file  Stress:   . Feeling of Stress : Not on file  Social Connections:   . Frequency of Communication with Friends and Family: Not on file  . Frequency of Social Gatherings with Friends and Family: Not on file  . Attends Religious Services: Not on file  . Active Member of Clubs or Organizations: Not on file  . Attends Banker Meetings: Not on file  . Marital Status: Not on file  Intimate Partner Violence:   . Fear of Current or Ex-Partner: Not on file  . Emotionally Abused: Not on file  . Physically Abused: Not  on file  . Sexually Abused: Not on file    Current Outpatient Medications on File Prior to Visit  Medication Sig Dispense Refill  . Cholecalciferol (VITAMIN D PO) Take by mouth every other day.    . cyanocobalamin (,VITAMIN B-12,) 1000 MCG/ML injection Inject 1 mL (1,000 mcg total) into the muscle every 30 (thirty) days. 1 mL 0  . dexamethasone (DECADRON) 0.5 MG tablet Take 1 tablet (0.5 mg total) by mouth daily. 90 tablet 3  . erythromycin ophthalmic ointment Place 1 application into the right eye 4 (four) times daily. 3.5 g 0  . ferrous sulfate 325 (65 FE) MG EC tablet Take 325 mg by mouth daily with breakfast.     . fludrocortisone (FLORINEF) 0.1 MG tablet TAKE 1 TABLET(0.1 MG) BY MOUTH DAILY 90 tablet 1  . levothyroxine (SYNTHROID) 175 MCG  tablet Take 1 tablet (175 mcg total) by mouth daily before breakfast. 30 tablet 3   No current facility-administered medications on file prior to visit.    No Known Allergies  Family History  Problem Relation Age of Onset  . Heart disease Father   . Arthritis Unknown   . Stroke Unknown   . Other Mother        Accident - had a fall    BP 110/68   Pulse (!) 48   Wt 130 lb (59 kg)   LMP 02/02/2016   SpO2 98%   BMI 20.36 kg/m   Review of Systems Denies lightheadedness.      Objective:   Physical Exam VITAL SIGNS:  See vs page GENERAL: no distress SKIN: diffuse hyperpigmentation.    Lab Results  Component Value Date   TSH 0.88 10/02/2020      Assessment & Plan:  Hypothyroidism: well-replaced.  Please continue the same synthroid Hyperpigmentation, worse.  Check ACTH and MSH.

## 2020-10-06 ENCOUNTER — Encounter: Payer: Self-pay | Admitting: Endocrinology

## 2020-10-07 ENCOUNTER — Encounter: Payer: Self-pay | Admitting: Endocrinology

## 2020-10-09 LAB — ACTH: C206 ACTH: 479 pg/mL — ABNORMAL HIGH (ref 6–50)

## 2020-10-09 LAB — ALPHA MELANOCYTE STIM HORMONE: ALPHA MELANOCYTE STIM HORMONE: 10 pg/mL (ref 0–100.0)

## 2020-12-30 ENCOUNTER — Ambulatory Visit: Payer: Self-pay | Admitting: Endocrinology

## 2021-02-10 ENCOUNTER — Ambulatory Visit (INDEPENDENT_AMBULATORY_CARE_PROVIDER_SITE_OTHER): Payer: 59 | Admitting: Family Medicine

## 2021-02-10 ENCOUNTER — Encounter: Payer: Self-pay | Admitting: Family Medicine

## 2021-02-10 ENCOUNTER — Other Ambulatory Visit: Payer: Self-pay

## 2021-02-10 VITALS — BP 98/62 | HR 71 | Ht 66.0 in | Wt 124.0 lb

## 2021-02-10 DIAGNOSIS — Z0000 Encounter for general adult medical examination without abnormal findings: Secondary | ICD-10-CM

## 2021-02-10 LAB — BASIC METABOLIC PANEL
BUN: 25 mg/dL — ABNORMAL HIGH (ref 6–23)
CO2: 28 mEq/L (ref 19–32)
Calcium: 10.3 mg/dL (ref 8.4–10.5)
Chloride: 93 mEq/L — ABNORMAL LOW (ref 96–112)
Creatinine, Ser: 1.18 mg/dL (ref 0.40–1.20)
GFR: 51.48 mL/min — ABNORMAL LOW (ref >60.00)
Glucose, Bld: 89 mg/dL (ref 70–99)
Potassium: 4.4 mEq/L (ref 3.5–5.1)
Sodium: 127 mEq/L — ABNORMAL LOW (ref 135–145)

## 2021-02-10 LAB — HEPATIC FUNCTION PANEL
ALT: 82 U/L — ABNORMAL HIGH (ref 0–35)
AST: 60 U/L — ABNORMAL HIGH (ref 0–37)
Albumin: 4.5 g/dL (ref 3.5–5.2)
Alkaline Phosphatase: 78 U/L (ref 39–117)
Bilirubin, Direct: 0.2 mg/dL (ref 0.0–0.3)
Total Bilirubin: 1 mg/dL (ref 0.2–1.2)
Total Protein: 8.1 g/dL (ref 6.0–8.3)

## 2021-02-10 LAB — CBC WITH DIFFERENTIAL/PLATELET
Basophils Absolute: 0 10*3/uL (ref 0.0–0.1)
Basophils Relative: 0.7 % (ref 0.0–3.0)
Eosinophils Absolute: 0.1 10*3/uL (ref 0.0–0.7)
Eosinophils Relative: 2.1 % (ref 0.0–5.0)
HCT: 38.7 % (ref 36.0–46.0)
Hemoglobin: 13.6 g/dL (ref 12.0–15.0)
Lymphocytes Relative: 41.1 % (ref 12.0–46.0)
Lymphs Abs: 2.3 10*3/uL (ref 0.7–4.0)
MCHC: 35.1 g/dL (ref 30.0–36.0)
MCV: 88.2 fl (ref 78.0–100.0)
Monocytes Absolute: 0.4 10*3/uL (ref 0.1–1.0)
Monocytes Relative: 7.5 % (ref 3.0–12.0)
Neutro Abs: 2.7 10*3/uL (ref 1.4–7.7)
Neutrophils Relative %: 48.6 % (ref 43.0–77.0)
Platelets: 309 10*3/uL (ref 150.0–400.0)
RBC: 4.39 Mil/uL (ref 3.87–5.11)
RDW: 13.3 % (ref 11.5–15.5)
WBC: 5.5 10*3/uL (ref 4.0–10.5)

## 2021-02-10 LAB — LIPID PANEL
Cholesterol: 213 mg/dL — ABNORMAL HIGH (ref 0–200)
HDL: 43.9 mg/dL (ref >39.00)
LDL Cholesterol: 147 mg/dL — ABNORMAL HIGH (ref 0–99)
NonHDL: 169.37
Total CHOL/HDL Ratio: 5
Triglycerides: 112 mg/dL (ref 0.0–149.0)
VLDL: 22.4 mg/dL (ref 0.0–40.0)

## 2021-02-10 MED ORDER — ASPIRIN EC 81 MG PO TBEC: 81.0000 mg | DELAYED_RELEASE_TABLET | Freq: Every day | ORAL | 11 refills | Status: DC

## 2021-02-10 NOTE — Progress Notes (Signed)
   Subjective:    Patient ID: Natalie Mullen, female    DOB: 28-Feb-1966, 55 y.o.   MRN: [id number]  HPI Here for a well exam. She feels fine. She still plays a lot of tennis. She sees Natalie Mullen about twice a year to manage her hypothyroidism and her Addison's disease. She asks if she should be taking aspirin or some other type of blood thinner given her hx of stroke.    Review of Systems  Constitutional: Negative.   HENT: Negative.   Eyes: Negative.   Respiratory: Negative.   Cardiovascular: Negative.   Gastrointestinal: Negative.   Genitourinary: Negative for decreased urine volume, difficulty urinating, dyspareunia, dysuria, enuresis, flank pain, frequency, hematuria, pelvic pain and urgency.  Musculoskeletal: Negative.   Skin: Negative.   Neurological: Negative.   Psychiatric/Behavioral: Negative.        Objective:   Physical Exam Constitutional:      General: She is not in acute distress.    Appearance: She is well-developed.  HENT:     Head: Normocephalic and atraumatic.     Right Ear: External ear normal.     Left Ear: External ear normal.     Nose: Nose normal.     Mouth/Throat:     Pharynx: No oropharyngeal exudate.  Eyes:     General: No scleral icterus.    Conjunctiva/sclera: Conjunctivae normal.     Pupils: Pupils are equal, round, and reactive to light.  Neck:     Thyroid: No thyromegaly.     Vascular: No JVD.  Cardiovascular:     Rate and Rhythm: Normal rate and regular rhythm.     Heart sounds: Normal heart sounds. No murmur heard. No friction rub. No gallop.   Pulmonary:     Effort: Pulmonary effort is normal. No respiratory distress.     Breath sounds: Normal breath sounds. No wheezing or rales.  Chest:     Chest wall: No tenderness.  Abdominal:     General: Bowel sounds are normal. There is no distension.     Palpations: Abdomen is soft. There is no mass.     Tenderness: There is no abdominal tenderness. There is no guarding or rebound.   Musculoskeletal:        General: No tenderness. Normal range of motion.     Cervical back: Normal range of motion and neck supple.  Lymphadenopathy:     Cervical: No cervical adenopathy.  Skin:    General: Skin is warm and dry.     Findings: No erythema or rash.  Neurological:     Mental Status: She is alert and oriented to person, place, and time.     Cranial Nerves: No cranial nerve deficit.     Motor: No abnormal muscle tone.     Coordination: Coordination normal.     Deep Tendon Reflexes: Reflexes are normal and symmetric. Reflexes normal.  Psychiatric:        Behavior: Behavior normal.        Thought Content: Thought content normal.        Judgment: Judgment normal.           Assessment & Plan:  Well exam. We discussed diet and exercise. Get fasting labs. I did advise her to take an 81 mg ASA every day, and she agreed. Natalie Crane, MD

## 2021-03-15 ENCOUNTER — Other Ambulatory Visit: Payer: Self-pay | Admitting: Endocrinology

## 2021-03-30 ENCOUNTER — Other Ambulatory Visit: Payer: Self-pay

## 2021-03-30 ENCOUNTER — Encounter: Payer: Self-pay | Admitting: Family Medicine

## 2021-03-30 ENCOUNTER — Ambulatory Visit (INDEPENDENT_AMBULATORY_CARE_PROVIDER_SITE_OTHER): Payer: 59 | Admitting: Family Medicine

## 2021-03-30 VITALS — BP 98/64 | HR 74 | Temp 98.0°F | Wt 126.4 lb

## 2021-03-30 DIAGNOSIS — J069 Acute upper respiratory infection, unspecified: Secondary | ICD-10-CM | POA: Diagnosis not present

## 2021-03-30 NOTE — Progress Notes (Signed)
   Subjective:    Patient ID: Valencia Kassa, female    DOB: 03-Oct-1966, 55 y.o.   MRN: [id number]  HPI Here for 3 days of a ST but no other symptoms. No fever or cough. She feels much better today. Drinking fluids.    Review of Systems  Constitutional: Negative.   HENT: Positive for sore throat. Negative for congestion, ear pain, postnasal drip, sinus pressure and trouble swallowing.   Eyes: Negative.   Respiratory: Negative.   Cardiovascular: Negative.   Gastrointestinal: Negative.        Objective:   Physical Exam Constitutional:      Appearance: Normal appearance. She is not ill-appearing.  HENT:     Right Ear: Tympanic membrane, ear canal and external ear normal.     Left Ear: Tympanic membrane, ear canal and external ear normal.     Nose: Nose normal.     Mouth/Throat:     Pharynx: Oropharynx is clear.  Eyes:     Conjunctiva/sclera: Conjunctivae normal.  Lymphadenopathy:     Cervical: No cervical adenopathy.  Neurological:     Mental Status: She is alert.           Assessment & Plan:  Viral URI. She seems to be on the mend already. Recheck as needed.  Gershon Crane, MD

## 2021-04-02 ENCOUNTER — Telehealth: Payer: Self-pay | Admitting: Family Medicine

## 2021-04-02 MED ORDER — AZITHROMYCIN 250 MG PO TABS: ORAL_TABLET | ORAL | 0 refills | Status: AC

## 2021-04-02 NOTE — Addendum Note (Signed)
Addended by: Gershon Crane A on: 04/02/2021 12:29 PM   Modules accepted: Orders

## 2021-04-02 NOTE — Telephone Encounter (Signed)
Not able to leave a message for pt mailbox is full. Send a MyChart message advising pt to call the office back

## 2021-04-02 NOTE — Telephone Encounter (Signed)
This may have turned into a sinus infection, so I sent her in a Zpack (antibiotic) to take

## 2021-04-02 NOTE — Telephone Encounter (Signed)
Patient informed of the message below.

## 2021-04-02 NOTE — Telephone Encounter (Signed)
Spoke with pt state that  she is still having problems from last visit on 03/30/2021, she still has sore throat, nasal drainage which is Green in color. Pt state that she would like advise on what to take to help with this.Please advise

## 2021-04-02 NOTE — Telephone Encounter (Signed)
Pt is calling in stating that she is not feeling any better from (Monday 03/30/2021) and would like to see what she should do since she was not given or told to take anything.  Pt would like to have a call back.

## 2021-06-03 ENCOUNTER — Other Ambulatory Visit: Payer: Self-pay

## 2021-06-03 ENCOUNTER — Ambulatory Visit (INDEPENDENT_AMBULATORY_CARE_PROVIDER_SITE_OTHER): Payer: 59 | Admitting: Endocrinology

## 2021-06-03 ENCOUNTER — Encounter: Payer: Self-pay | Admitting: Endocrinology

## 2021-06-03 VITALS — BP 106/58 | HR 60 | Ht 66.0 in | Wt 130.0 lb

## 2021-06-03 DIAGNOSIS — E063 Autoimmune thyroiditis: Secondary | ICD-10-CM

## 2021-06-03 DIAGNOSIS — E038 Other specified hypothyroidism: Secondary | ICD-10-CM

## 2021-06-03 DIAGNOSIS — E271 Primary adrenocortical insufficiency: Secondary | ICD-10-CM

## 2021-06-03 LAB — TSH: TSH: 18.9 u[IU]/mL — ABNORMAL HIGH (ref 0.35–5.50)

## 2021-06-03 LAB — CORTISOL: Cortisol, Plasma: 2 ug/dL

## 2021-06-03 LAB — BASIC METABOLIC PANEL
BUN: 25 mg/dL — ABNORMAL HIGH (ref 6–23)
CO2: 27 mEq/L (ref 19–32)
Calcium: 10.4 mg/dL (ref 8.4–10.5)
Chloride: 100 mEq/L (ref 96–112)
Creatinine, Ser: 1.17 mg/dL (ref 0.40–1.20)
GFR: 51.89 mL/min — ABNORMAL LOW (ref >60.00)
Glucose, Bld: 57 mg/dL — ABNORMAL LOW (ref 70–99)
Potassium: 4.1 mEq/L (ref 3.5–5.1)
Sodium: 133 mEq/L — ABNORMAL LOW (ref 135–145)

## 2021-06-03 LAB — T4, FREE: Free T4: 0.9 ng/dL (ref 0.60–1.60)

## 2021-06-03 NOTE — Progress Notes (Signed)
Subjective:    Patient ID: Natalie Mullen, female    DOB: [birth year], 55 y.o.   MRN: [id number]  HPI Pt returns for fu of Addison's Dz (dx'ed 2016, when she presented with diffuse hyperpigmentation; she did not tolerate prednisone (skin sensation)).  She takes fludrocortisone just 1/2 tab qd, and decadron 1/2 tab qd.  pt states she feels well in general, except skin darkening persists.   She also has hypothyroidism (dx'ed 2011).   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 Cyndie Chime   Antiphospholipid antibody with hypercoagulable state ([facility]) 03/27/2012   Initial presentation 05/2010 with acute left MCA stroke   Autoimmune hepatitis [facility])    sees Dr. 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 Kinnie Scales, clear, repeat in 10 yrs    LASIK     TONSILLECTOMY      Social History   Socioeconomic History   Marital status: Divorced    Spouse name: Not on file   Number of children: 3   Years of education: Masters   Highest education level: Not on file  Occupational History   Occupation: Research scientist (physical sciences) - constuction  Tobacco Use   Smoking status: Former    Types: Cigarettes    Quit date: 06/17/2010    Years since quitting: 10.9   Smokeless tobacco: Never  Substance and Sexual Activity   Alcohol use: No    Alcohol/week: 0.0 standard drinks   Drug use: No   Sexual activity: Not on file  Other Topics Concern   Not on file  Social History Narrative   Lives at  home alone.   Originally from [place]   Avid tennis player   Occasional caffeine use.   Social Determinants of Health   Financial Resource Strain: Not on file  Food Insecurity: Not on file  Transportation Needs: Not on file  Physical Activity: Not on file  Stress: Not on file  Social Connections: Not on file  Intimate Partner Violence: Not on file    Current Outpatient Medications on File Prior to Visit  Medication Sig Dispense Refill   Cholecalciferol (VITAMIN D PO) Take by mouth every other day.     dexamethasone (DECADRON) 0.5 MG tablet TAKE 1 TABLET(0.5 MG) BY MOUTH DAILY 90 tablet 3   ferrous sulfate 325 (65 FE) MG EC tablet Take 325 mg by mouth daily with breakfast.  (Patient not taking: No sig reported)     fludrocortisone (FLORINEF) 0.1 MG tablet TAKE 1 TABLET(0.1 MG) BY MOUTH DAILY 90 tablet 1   No current facility-administered medications on file prior to visit.    No Known Allergies  Family History  Problem Relation Age of Onset   Heart disease Father    Arthritis Other    Stroke Other    Other Mother        Accident - had a fall    BP (!) 106/58 (BP Location:  Left Arm, Patient Position: Sitting, Cuff Size: Normal)   Pulse 60   Ht 5\' 6"  (1.676 m)   Wt 130 lb (59 kg)   LMP 02/02/2016   SpO2 98%   BMI 20.98 kg/m    Review of Systems Denies dizziness and LOC.      Objective:   Physical Exam NECK: There is no palpable thyroid enlargement.  No thyroid nodule is palpable.  No palpable lymphadenopathy at the anterior neck.  Lab Results  Component Value Date   TSH 18.90 (H) 06/03/2021   Lab Results  Component Value Date   CREATININE 1.17 06/03/2021   BUN 25 (H) 06/03/2021   NA 133 (L) 06/03/2021   K 4.1 06/03/2021   CL 100 06/03/2021   CO2 27 06/03/2021       Assessment & Plan:  Addison's Dz: uncontrolled. Hypothyroidism: uncontrolled I have sent prescriptions to your pharmacy, to increase all 3 meds. Recheck labs 6  weeks

## 2021-06-03 NOTE — Patient Instructions (Addendum)
Blood tests are requested for you today.  We'll let you know about the results.   Based on the results, you will probably need to increase both half pills you are taking to whole pills.   It is best to never miss the medication.  However, if you do miss it, next best is to double up the next time.   Please come back for a follow-up appointment in 6 months.

## 2021-06-04 MED ORDER — LEVOTHYROXINE SODIUM 200 MCG PO TABS: 200.0000 ug | ORAL_TABLET | Freq: Every day | ORAL | 3 refills | Status: DC

## 2021-06-11 LAB — ACTH: C206 ACTH: 787 pg/mL — ABNORMAL HIGH (ref 6–50)

## 2021-07-08 ENCOUNTER — Other Ambulatory Visit: Payer: Self-pay

## 2021-07-08 ENCOUNTER — Other Ambulatory Visit (INDEPENDENT_AMBULATORY_CARE_PROVIDER_SITE_OTHER): Payer: 59

## 2021-07-08 DIAGNOSIS — E063 Autoimmune thyroiditis: Secondary | ICD-10-CM

## 2021-07-08 DIAGNOSIS — E271 Primary adrenocortical insufficiency: Secondary | ICD-10-CM | POA: Diagnosis not present

## 2021-07-08 DIAGNOSIS — E038 Other specified hypothyroidism: Secondary | ICD-10-CM

## 2021-07-08 LAB — BASIC METABOLIC PANEL
BUN: 20 mg/dL (ref 6–23)
CO2: 27 mEq/L (ref 19–32)
Calcium: 9.8 mg/dL (ref 8.4–10.5)
Chloride: 103 mEq/L (ref 96–112)
Creatinine, Ser: 0.89 mg/dL (ref 0.40–1.20)
GFR: 72.01 mL/min (ref >60.00)
Glucose, Bld: 73 mg/dL (ref 70–99)
Potassium: 4.2 mEq/L (ref 3.5–5.1)
Sodium: 135 mEq/L (ref 135–145)

## 2021-07-08 LAB — T4, FREE: Free T4: 2.12 ng/dL — ABNORMAL HIGH (ref 0.60–1.60)

## 2021-07-08 LAB — TSH: TSH: 0.06 u[IU]/mL — ABNORMAL LOW (ref 0.35–5.50)

## 2021-07-11 ENCOUNTER — Other Ambulatory Visit: Payer: Self-pay | Admitting: Endocrinology

## 2021-07-11 DIAGNOSIS — E271 Primary adrenocortical insufficiency: Secondary | ICD-10-CM

## 2021-07-11 LAB — ACTH: C206 ACTH: 39 pg/mL (ref 6–50)

## 2021-07-11 MED ORDER — DEXAMETHASONE 0.5 MG PO TABS: 0.7500 mg | ORAL_TABLET | Freq: Every day | ORAL | 3 refills | Status: DC

## 2021-08-06 ENCOUNTER — Encounter: Payer: Self-pay | Admitting: Endocrinology

## 2021-08-06 ENCOUNTER — Other Ambulatory Visit: Payer: Self-pay

## 2021-08-06 ENCOUNTER — Other Ambulatory Visit: Payer: Self-pay | Admitting: Endocrinology

## 2021-08-06 ENCOUNTER — Other Ambulatory Visit (INDEPENDENT_AMBULATORY_CARE_PROVIDER_SITE_OTHER): Payer: 59

## 2021-08-06 DIAGNOSIS — I639 Cerebral infarction, unspecified: Secondary | ICD-10-CM

## 2021-08-06 DIAGNOSIS — E271 Primary adrenocortical insufficiency: Secondary | ICD-10-CM

## 2021-08-06 LAB — BASIC METABOLIC PANEL
BUN: 18 mg/dL (ref 6–23)
CO2: 26 mEq/L (ref 19–32)
Calcium: 9.9 mg/dL (ref 8.4–10.5)
Chloride: 103 mEq/L (ref 96–112)
Creatinine, Ser: 0.99 mg/dL (ref 0.40–1.20)
GFR: 63.34 mL/min (ref >60.00)
Glucose, Bld: 50 mg/dL — ABNORMAL LOW (ref 70–99)
Potassium: 3.6 mEq/L (ref 3.5–5.1)
Sodium: 136 mEq/L (ref 135–145)

## 2021-08-09 LAB — ACTH: C206 ACTH: 9 pg/mL (ref 6–50)

## 2021-09-28 ENCOUNTER — Ambulatory Visit: Payer: 59 | Admitting: Family Medicine

## 2021-10-05 ENCOUNTER — Ambulatory Visit (INDEPENDENT_AMBULATORY_CARE_PROVIDER_SITE_OTHER): Payer: 59 | Admitting: Family Medicine

## 2021-10-05 ENCOUNTER — Encounter: Payer: Self-pay | Admitting: Family Medicine

## 2021-10-05 VITALS — BP 96/62 | HR 68 | Temp 98.2°F | Wt 134.5 lb

## 2021-10-05 DIAGNOSIS — N951 Menopausal and female climacteric states: Secondary | ICD-10-CM | POA: Diagnosis not present

## 2021-10-05 MED ORDER — PREMPRO 0.3-1.5 MG PO TABS: 1.0000 | ORAL_TABLET | Freq: Every day | ORAL | 3 refills | Status: DC

## 2021-10-05 MED ORDER — ASPIRIN 81 MG PO TBEC: 81.0000 mg | DELAYED_RELEASE_TABLET | Freq: Every day | ORAL | 12 refills | Status: DC

## 2021-10-05 NOTE — Progress Notes (Signed)
    Subjective:    Patient ID: Natalie Mullen, female    DOB: July 07, 1966, 55 y.o.   MRN: [id number]  HPI Here asking for help with hot flashes and insomnia. These started about 6 months ago and they are getting worse. The flashes wake her up at night, and she soaks the bed with sweat.these are less intense during the day. She has not had a menses for 4 years. She has a normal mammogram 2 months. She has tried black cohosh and Estroven OTC with no relief.    Review of Systems  Constitutional: Negative.   Respiratory: Negative.    Cardiovascular: Negative.       Objective:   Physical Exam Constitutional:      Appearance: Normal appearance.  Cardiovascular:     Rate and Rhythm: Normal rate and regular rhythm.     Pulses: Normal pulses.     Heart sounds: Normal heart sounds.  Pulmonary:     Effort: Pulmonary effort is normal.     Breath sounds: Normal breath sounds.  Neurological:     Mental Status: She is alert.          Assessment & Plan:  Menopausal hot flashes. She will try Prempro 0.3-1.5 daily. Due to her past hx of stroke I asked her to start back on 81 mg of ASA daily. Recheck in 3 months.  Gershon Crane, MD

## 2021-10-06 ENCOUNTER — Telehealth: Payer: Self-pay

## 2021-10-06 NOTE — Telephone Encounter (Signed)
Patient called stating a prior authorization is needed for  estrogen, conjugated,-medroxyprogesterone (PREMPRO) 0.3-1.5 MG tablet

## 2021-10-07 NOTE — Telephone Encounter (Signed)
Pt PA was submitted to her plan for approval

## 2021-10-08 ENCOUNTER — Ambulatory Visit: Payer: 59 | Admitting: Endocrinology

## 2021-10-12 NOTE — Telephone Encounter (Signed)
Patient called again to follow up on the PA being sent to insurance. I let patient know that it had been submitted and when something comes back from insurance on their decision, she will receive a call.     Good callback number is [phone]   Please advise

## 2021-10-13 ENCOUNTER — Telehealth: Payer: Self-pay

## 2021-10-13 NOTE — Telephone Encounter (Signed)
Spoke with pt advised that PA can take up to 72 hrs before approval, advised that her insurance of our office will reach out to her with PA decision

## 2021-10-13 NOTE — Telephone Encounter (Signed)
Pt PA form has been faxed to [facility]

## 2021-10-19 NOTE — Telephone Encounter (Signed)
Pt is aware waiting on PA decision

## 2021-10-19 NOTE — Telephone Encounter (Signed)
Pt PA has been denied,pt plan sent in some alternative medication that Dr Clent Ridges need to review and approve, form placed in Dr Thayer Dallas folder

## 2021-10-20 ENCOUNTER — Telehealth: Payer: Self-pay | Admitting: Family Medicine

## 2021-10-20 MED ORDER — ESTRADIOL 1 MG PO TABS: 1.0000 mg | ORAL_TABLET | Freq: Every day | ORAL | 3 refills | Status: DC

## 2021-10-20 MED ORDER — PROGESTERONE MICRONIZED 100 MG PO CAPS: 100.0000 mg | ORAL_CAPSULE | Freq: Every day | ORAL | 3 refills | Status: DC

## 2021-10-20 NOTE — Telephone Encounter (Signed)
Done

## 2021-10-20 NOTE — Telephone Encounter (Signed)
Spoke with pt aware that Dr Clent Ridges sent in new Rx for Estradiol 1 mg and Progesterone 100 mg, pt verbalized understanding

## 2021-11-02 ENCOUNTER — Ambulatory Visit (INDEPENDENT_AMBULATORY_CARE_PROVIDER_SITE_OTHER): Payer: 59 | Admitting: Family Medicine

## 2021-11-02 VITALS — BP 118/62 | HR 65 | Temp 97.5°F | Wt 134.7 lb

## 2021-11-02 DIAGNOSIS — R1032 Left lower quadrant pain: Secondary | ICD-10-CM

## 2021-11-02 LAB — POCT URINALYSIS DIPSTICK
Bilirubin, UA: NEGATIVE
Blood, UA: NEGATIVE
Glucose, UA: NEGATIVE
Ketones, UA: NEGATIVE
Leukocytes, UA: NEGATIVE
Nitrite, UA: NEGATIVE
Protein, UA: NEGATIVE
Spec Grav, UA: 1.015 (ref 1.010–1.025)
Urobilinogen, UA: 0.2 E.U./dL
pH, UA: 7 (ref 5.0–8.0)

## 2021-11-02 NOTE — Progress Notes (Signed)
Established Patient Office Visit  Subjective:  Patient ID: Natalie Mullen, female    DOB: [birth year]  Age: 55 y.o. MRN: [id number]  CC:  Chief Complaint  Patient presents with   Abdominal Pain    LLQ, x 2 days, no known injury, no urinary symptoms, BM regular    HPI [name] presents for pain left lower quadrant for about 2 days.  Currently pain-free.  Pain somewhat intermittent.  She had colonoscopy 2016.  No known diverticular disease.  No fever.  No nausea or vomiting.  Has had occasional constipation in the past but not recently.  She played tennis earlier today without difficulty.  No clear triggers.  No recent vaginal spotting.  No urinary symptoms.  No history of kidney stones.  No gross hematuria.  Appetite and weight stable.  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 Cyndie Chime   Antiphospholipid antibody with hypercoagulable state (HCC) 03/27/2012   Initial presentation 05/2010 with acute left MCA stroke   Autoimmune hepatitis [facility])    sees Dr. 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 Kinnie Scales, clear, repeat in 10 yrs    LASIK     TONSILLECTOMY      Family History  Problem Relation Age of Onset   Heart disease Father    Arthritis Other    Stroke Other    Other Mother        Accident - had a fall    Social History   Socioeconomic History   Marital status: Divorced    Spouse name: Not on file   Number of children: 3   Years of  education: Masters   Highest education level: Master's degree (e.g., MA, MS, MEng, MEd, MSW, MBA)  Occupational History   Occupation: Research scientist (physical sciences) - constuction  Tobacco Use   Smoking status: Former    Types: Cigarettes    Quit date: 06/17/2010    Years since quitting: 11.3   Smokeless tobacco: Never  Substance and Sexual Activity   Alcohol use: No    Alcohol/week: 0.0 standard drinks   Drug use: No   Sexual activity: Not on file  Other Topics Concern   Not on file  Social History Narrative   Lives at home alone.   Originally from [place]   Avid tennis player   Occasional caffeine use.   Social Determinants of Health   Financial Resource Strain: Not on file  Food Insecurity: Unknown   Worried About Programme researcher, broadcasting/film/video in the Last Year: Never true   Ran Out of Food in the Last Year: Not on file  Transportation Needs: No Transportation Needs   Lack of Transportation (Medical): No   Lack of Transportation (Non-Medical): No  Physical Activity: Sufficiently Active   Days of Exercise per Week: 3 days   Minutes of Exercise per Session: 90 min  Stress: No Stress Concern Present   Feeling  of Stress : Only a little  Social Connections: Socially Isolated   Frequency of Communication with Friends and Family: Once a week   Frequency of Social Gatherings with Friends and Family: Once a week   Attends Religious Services: Never   Database administratorActive Member of Clubs or Organizations: Yes   Attends Engineer, structuralClub or Organization Meetings: 1 to 4 times per year   Marital Status: Divorced  Catering managerntimate Partner Violence: Not on file    Outpatient Medications Prior to Visit  Medication Sig Dispense Refill   aspirin 81 MG EC tablet Take 1 tablet (81 mg total) by mouth daily. Swallow whole. 30 tablet 12   dexamethasone (DECADRON) 0.5 MG tablet Take 1.5 tablets (0.75 mg total) by mouth daily. 135 tablet 3   estradiol (ESTRACE) 1 MG tablet Take 1 tablet (1 mg total) by mouth daily. 90 tablet 3    fludrocortisone (FLORINEF) 0.1 MG tablet TAKE 1 TABLET(0.1 MG) BY MOUTH DAILY 90 tablet 1   levothyroxine (SYNTHROID) 200 MCG tablet Take 1 tablet (200 mcg total) by mouth daily. 90 tablet 3   progesterone (PROMETRIUM) 100 MG capsule Take 1 capsule (100 mg total) by mouth daily. 90 capsule 3   No facility-administered medications prior to visit.    No Known Allergies  ROS Review of Systems  Constitutional:  Negative for chills and fever.  Cardiovascular:  Negative for chest pain.  Gastrointestinal:  Positive for abdominal pain. Negative for blood in stool, constipation, diarrhea, nausea and vomiting.  Genitourinary:  Negative for dysuria, flank pain and hematuria.     Objective:    Physical Exam Vitals reviewed.  Constitutional:      Appearance: She is well-developed.  Cardiovascular:     Rate and Rhythm: Normal rate and regular rhythm.  Pulmonary:     Effort: Pulmonary effort is normal.     Breath sounds: Normal breath sounds.  Abdominal:     Comments: Normal bowel sounds.  Abdomen nondistended.  No reproducible tenderness at this time on exam.  No masses palpated.  No guarding or rebound.  Neurological:     Mental Status: She is alert.    BP 118/62 (BP Location: Left Arm, Patient Position: Sitting, Cuff Size: Normal)   Pulse 65   Temp (!) 97.5 F (36.4 C) (Oral)   Wt 134 lb 11.2 oz (61.1 kg)   LMP 02/02/2016   SpO2 99%   BMI 21.74 kg/m  Wt Readings from Last 3 Encounters:  11/02/21 134 lb 11.2 oz (61.1 kg)  10/05/21 134 lb 8 oz (61 kg)  06/03/21 130 lb (59 kg)     Health Maintenance Due  Topic Date Due   Pneumococcal Vaccine 7819-57 Years old (1 - PCV) Never done   HIV Screening  Never done   MAMMOGRAM  06/18/2018    There are no preventive care reminders to display for this patient.  Lab Results  Component Value Date   TSH 0.06 (L) 07/08/2021   Lab Results  Component Value Date   WBC 5.5 02/10/2021   HGB 13.6 02/10/2021   HCT 38.7 02/10/2021   MCV  88.2 02/10/2021   PLT 309.0 02/10/2021   Lab Results  Component Value Date   NA 136 08/06/2021   K 3.6 08/06/2021   CHLORIDE 103 11/05/2013   CO2 26 08/06/2021   GLUCOSE 50 (L) 08/06/2021   BUN 18 08/06/2021   CREATININE 0.99 08/06/2021   BILITOT 1.0 02/10/2021   ALKPHOS 78 02/10/2021   AST 60 (H) 02/10/2021  ALT 82 (H) 02/10/2021   PROT 8.1 02/10/2021   ALBUMIN 4.5 02/10/2021   CALCIUM 9.9 08/06/2021   ANIONGAP 5 09/01/2015   GFR 63.34 08/06/2021   Lab Results  Component Value Date   CHOL 213 (H) 02/10/2021   Lab Results  Component Value Date   HDL 43.90 02/10/2021   Lab Results  Component Value Date   LDLCALC 147 (H) 02/10/2021   Lab Results  Component Value Date   TRIG 112.0 02/10/2021   Lab Results  Component Value Date   CHOLHDL 5 02/10/2021   No results found for: HGBA1C    Assessment & Plan:   Problem List Items Addressed This Visit   None Visit Diagnoses     Left lower quadrant abdominal pain    -  Primary   Relevant Orders   POCT urinalysis dipstick (Completed)     Patient relates 2-day history of left lower quadrant abdominal pain.  Currently pain-free.  Urine dipstick is unremarkable.  Doubt kidney stones with no blood on dipstick.  She has had some recent constipation but none currently.  No history of diverticular disease.  Benign exam.  -Recommend observation for now. -Touch base for any recurrent pain, fever, or other new symptoms -We recommend she follow-up with Dr. Sarajane Jews within the next couple weeks for recurrent/intermittent symptoms  No orders of the defined types were placed in this encounter.   Follow-up: No follow-ups on file.    Carolann Littler, MD

## 2021-11-02 NOTE — Patient Instructions (Signed)
Let me know if pain not resolving over the next week, and let me know sooner for any fever, worsening pain or other new symptoms.

## 2021-12-04 ENCOUNTER — Ambulatory Visit (INDEPENDENT_AMBULATORY_CARE_PROVIDER_SITE_OTHER): Payer: 59 | Admitting: Endocrinology

## 2021-12-04 ENCOUNTER — Other Ambulatory Visit: Payer: Self-pay

## 2021-12-04 VITALS — BP 84/60 | HR 77 | Ht 66.0 in | Wt 134.6 lb

## 2021-12-04 DIAGNOSIS — E063 Autoimmune thyroiditis: Secondary | ICD-10-CM | POA: Diagnosis not present

## 2021-12-04 DIAGNOSIS — E271 Primary adrenocortical insufficiency: Secondary | ICD-10-CM | POA: Diagnosis not present

## 2021-12-04 DIAGNOSIS — E038 Other specified hypothyroidism: Secondary | ICD-10-CM | POA: Diagnosis not present

## 2021-12-04 MED ORDER — DEXAMETHASONE 0.5 MG PO TABS: 0.5000 mg | ORAL_TABLET | Freq: Every day | ORAL | 3 refills | Status: DC

## 2021-12-04 NOTE — Patient Instructions (Addendum)
Blood tests are requested for you today.  We'll let you know about the results.  Please call if you have dizziness. Please come back for a follow-up appointment in 6 months.

## 2021-12-04 NOTE — Progress Notes (Signed)
Subjective:    Patient ID: Natalie Mullen, female    DOB: [birth year], 56 y.o.   MRN: [id number]  HPI Pt returns for fu of Addison's Dz (dx'ed 2016, when she presented with diffuse hyperpigmentation; she did not tolerate prednisone (skin sensation); adrenal CT was normal in 2012).  She takes fludrocortisone 1 tab of 100 mcg qd, and decadron 1/2 of a 0.5 tab qd.  pt states she feels well in general.  She wants to minimize med dosages.   She also has hypothyroidism (dx'ed 2011; she has never had thyroid imaging).  She takes synthroid as rx'ed.   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 Cyndie Chime   Antiphospholipid antibody with hypercoagulable state (HCC) 03/27/2012   Initial presentation 05/2010 with acute left MCA stroke   Autoimmune hepatitis [facility])    sees Dr. 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 Kinnie Scales, clear, repeat in 10 yrs    LASIK     TONSILLECTOMY      Social History   Socioeconomic History   Marital status: Divorced    Spouse name: Not on file   Number of children: 3   Years of education: Masters   Highest education level: Master's degree (e.g., MA, MS, MEng, MEd, MSW, MBA)  Occupational History   Occupation: Research scientist (physical sciences) - constuction  Tobacco Use   Smoking status: Former    Types: Cigarettes    Quit date: 06/17/2010    Years since quitting: 11.4   Smokeless tobacco: Never  Substance and Sexual Activity   Alcohol use: No     Alcohol/week: 0.0 standard drinks   Drug use: No   Sexual activity: Not on file  Other Topics Concern   Not on file  Social History Narrative   Lives at home alone.   Originally from [place]   Avid tennis player   Occasional caffeine use.   Social Determinants of Health   Financial Resource Strain: Not on file  Food Insecurity: Unknown   Worried About Programme researcher, broadcasting/film/video in the Last Year: Never true   Ran Out of Food in the Last Year: Not on file  Transportation Needs: No Transportation Needs   Lack of Transportation (Medical): No   Lack of Transportation (Non-Medical): No  Physical Activity: Sufficiently Active   Days of Exercise per Week: 3 days   Minutes of Exercise per Session: 90 min  Stress: No Stress Concern Present   Feeling of Stress : Only a little  Social Connections: Socially Isolated   Frequency of Communication with Friends and Family: Once a week   Frequency of Social Gatherings with Friends and Family: Once a week   Attends Religious Services: Never   Database administrator or Organizations: Yes   Attends Banker Meetings: 1 to 4 times per year   Marital Status: Divorced  Catering manager Violence: Not on file  Current Outpatient Medications on File Prior to Visit  Medication Sig Dispense Refill   aspirin 81 MG EC tablet Take 1 tablet (81 mg total) by mouth daily. Swallow whole. 30 tablet 12   estradiol (ESTRACE) 1 MG tablet Take 1 tablet (1 mg total) by mouth daily. 90 tablet 3   progesterone (PROMETRIUM) 100 MG capsule Take 1 capsule (100 mg total) by mouth daily. 90 capsule 3   No current facility-administered medications on file prior to visit.    No Known Allergies  Family History  Problem Relation Age of Onset   Heart disease Father    Arthritis Other    Stroke Other    Other Mother        Accident - had a fall    BP (!) 84/60    Pulse 77    Ht 5\' 6"  (1.676 m)    Wt 134 lb 9.6 oz (61.1 kg)    LMP 02/02/2016     SpO2 97%    BMI 21.73 kg/m    Review of Systems Denies LOC    Objective:   Physical Exam VITAL SIGNS:  See vs page GENERAL: no distress NECK: There is no palpable thyroid enlargement.  No thyroid nodule is palpable.  No palpable lymphadenopathy at the anterior neck.     Lab Results  Component Value Date   CREATININE 0.98 12/04/2021   BUN 18 12/04/2021   NA 135 12/04/2021   K 4.0 12/04/2021   CL 103 12/04/2021   CO2 22 12/04/2021   Lab Results  Component Value Date   TSH 27.55 (H) 12/04/2021      Assessment & Plan:  Hypothyroidism: uncontrolled.  I asked pt given high synthroid dosage, does she know why this is? Addison's Dz.  I advised pt to increase decadron ad florinef to rx'ed dosages.

## 2021-12-06 MED ORDER — LEVOTHYROXINE SODIUM 200 MCG PO TABS: 200.0000 ug | ORAL_TABLET | Freq: Every day | ORAL | 3 refills | Status: DC

## 2021-12-06 MED ORDER — FLUDROCORTISONE ACETATE 0.1 MG PO TABS: ORAL_TABLET | ORAL | 3 refills | Status: DC

## 2021-12-09 ENCOUNTER — Other Ambulatory Visit: Payer: Self-pay | Admitting: Endocrinology

## 2021-12-09 ENCOUNTER — Encounter: Payer: Self-pay | Admitting: Endocrinology

## 2021-12-09 DIAGNOSIS — E038 Other specified hypothyroidism: Secondary | ICD-10-CM

## 2021-12-09 DIAGNOSIS — E271 Primary adrenocortical insufficiency: Secondary | ICD-10-CM

## 2021-12-16 LAB — BASIC METABOLIC PANEL
BUN: 18 mg/dL (ref 7–25)
CO2: 22 mmol/L (ref 20–32)
Calcium: 9.8 mg/dL (ref 8.6–10.4)
Chloride: 103 mmol/L (ref 98–110)
Creat: 0.98 mg/dL (ref 0.50–1.03)
Glucose, Bld: 67 mg/dL (ref 65–99)
Potassium: 4 mmol/L (ref 3.5–5.3)
Sodium: 135 mmol/L (ref 135–146)

## 2021-12-16 LAB — TSH: TSH: 27.55 mIU/L — ABNORMAL HIGH (ref 0.40–4.50)

## 2021-12-16 LAB — ADRENAL ANTIBODY SCREEN RFLX TITER
Adrenal Ab: NEGATIVE
Adrenal Ab: NEGATIVE

## 2021-12-16 LAB — ACTH: C206 ACTH: 303 pg/mL — ABNORMAL HIGH (ref 6–50)

## 2021-12-16 LAB — T4, FREE: Free T4: 1 ng/dL (ref 0.8–1.8)

## 2022-01-01 ENCOUNTER — Telehealth: Payer: Self-pay

## 2022-01-01 NOTE — Telephone Encounter (Signed)
Message sent to PCP as  FYI

## 2022-01-04 NOTE — Telephone Encounter (Signed)
Noted  

## 2022-01-05 ENCOUNTER — Other Ambulatory Visit (INDEPENDENT_AMBULATORY_CARE_PROVIDER_SITE_OTHER): Payer: 59

## 2022-01-05 ENCOUNTER — Other Ambulatory Visit: Payer: Self-pay

## 2022-01-05 DIAGNOSIS — E063 Autoimmune thyroiditis: Secondary | ICD-10-CM | POA: Diagnosis not present

## 2022-01-05 DIAGNOSIS — E271 Primary adrenocortical insufficiency: Secondary | ICD-10-CM

## 2022-01-05 DIAGNOSIS — E038 Other specified hypothyroidism: Secondary | ICD-10-CM | POA: Diagnosis not present

## 2022-01-06 LAB — T3, FREE: T3, Free: 3 pg/mL (ref 2.3–4.2)

## 2022-01-06 LAB — T4, FREE: Free T4: 1.45 ng/dL (ref 0.60–1.60)

## 2022-01-06 LAB — TSH: TSH: 0.53 u[IU]/mL (ref 0.35–5.50)

## 2022-01-09 LAB — THYROGLOBULIN ANTIBODY: Thyroglobulin Ab: >1000 IU/mL — ABNORMAL HIGH (ref <=1)

## 2022-01-09 LAB — THYROID PEROXIDASE ANTIBODY: Thyroperoxidase Ab SerPl-aCnc: 207 IU/mL — ABNORMAL HIGH (ref <9)

## 2022-01-09 LAB — ACTH: C206 ACTH: 51 pg/mL — ABNORMAL HIGH (ref 6–50)

## 2022-01-10 LAB — THYROID STIMULATING IMMUNOGLOBULIN: TSI: 102 % baseline (ref <140)

## 2022-01-10 LAB — T4
Free Thyroxine Index: 4 — ABNORMAL HIGH (ref 1.4–3.8)
T4, Total: 13.4 ug/dL — ABNORMAL HIGH (ref 5.1–11.9)

## 2022-01-10 LAB — T3, REVERSE: T3, Reverse: 24 ng/dL (ref 8–25)

## 2022-01-10 LAB — T3 UPTAKE: T3 Uptake: 30 % (ref 22–35)

## 2022-01-15 ENCOUNTER — Telehealth: Payer: Self-pay | Admitting: Family Medicine

## 2022-01-15 ENCOUNTER — Other Ambulatory Visit: Payer: Self-pay

## 2022-01-15 MED ORDER — ESTRADIOL 0.1 MG/24HR TD PTWK: 0.1000 mg | MEDICATED_PATCH | TRANSDERMAL | 3 refills | Status: DC

## 2022-01-15 NOTE — Telephone Encounter (Signed)
Called patient to informed prescription has been sent to pharmacy

## 2022-01-15 NOTE — Telephone Encounter (Signed)
I sent in the patches

## 2022-01-15 NOTE — Telephone Encounter (Signed)
Pt is calling and does not want estradiol (ESTRACE) 1 MG tablet and would like estradiol skin patch send to  [facility] #[id number] Gin[place] Otto, [place] - [address] AT [facility] [address] [facility] [address] Phone:  [phone]  Fax:  [phone]

## 2022-01-15 NOTE — Telephone Encounter (Signed)
Last Ov 10/05/21 Last filled 10/20/21 Please advise

## 2022-01-15 NOTE — Addendum Note (Signed)
Addended by: Alysia Penna A on: 01/15/2022 03:54 PM   Modules accepted: Orders

## 2022-07-30 ENCOUNTER — Telehealth: Payer: Self-pay | Admitting: Family Medicine

## 2022-07-30 NOTE — Telephone Encounter (Signed)
Pharmacy updated. Last OV with Dr. Burchette--11/02/21

## 2022-07-30 NOTE — Telephone Encounter (Signed)
Pt called to request that MD please change the following medication:  estradiol (CLIMARA - DOSED IN MG/24 HR) 0.1 mg/24hr patch  Pt would rather have the:  Transdermal system twice wkly of 0.075 mg per day.  LOV:  11/02/21  Please advise.  [facility] #[id number] G[place]ette Otto, [place] - [address] AT [facility] [address] [facility] [address] Phone:  [phone]  Fax:  [phone]

## 2022-07-30 NOTE — Telephone Encounter (Signed)
Okay, please change the patches as she requested and send in a one year supply

## 2022-08-02 ENCOUNTER — Other Ambulatory Visit: Payer: Self-pay

## 2022-08-02 MED ORDER — ESTRADIOL 0.075 MG/24HR TD PTWK: 0.0750 mg | MEDICATED_PATCH | TRANSDERMAL | 12 refills | Status: DC

## 2022-08-02 NOTE — Telephone Encounter (Signed)
Spoke with patient, requested prescription sent to [facility].

## 2022-10-24 ENCOUNTER — Other Ambulatory Visit: Payer: Self-pay | Admitting: Family Medicine

## 2022-10-25 DIAGNOSIS — M79672 Pain in left foot: Secondary | ICD-10-CM | POA: Diagnosis not present

## 2022-10-31 DIAGNOSIS — B009 Herpesviral infection, unspecified: Secondary | ICD-10-CM | POA: Diagnosis not present

## 2022-11-08 ENCOUNTER — Encounter: Payer: Self-pay | Admitting: Family Medicine

## 2022-11-09 ENCOUNTER — Ambulatory Visit (INDEPENDENT_AMBULATORY_CARE_PROVIDER_SITE_OTHER): Payer: BC Managed Care – PPO | Admitting: Family Medicine

## 2022-11-09 ENCOUNTER — Encounter: Payer: Self-pay | Admitting: Family Medicine

## 2022-11-09 VITALS — BP 102/68 | HR 42 | Temp 97.5°F | Ht 72.5 in | Wt 129.2 lb

## 2022-11-09 DIAGNOSIS — E038 Other specified hypothyroidism: Secondary | ICD-10-CM

## 2022-11-09 DIAGNOSIS — E271 Primary adrenocortical insufficiency: Secondary | ICD-10-CM | POA: Diagnosis not present

## 2022-11-09 DIAGNOSIS — E063 Autoimmune thyroiditis: Secondary | ICD-10-CM | POA: Diagnosis not present

## 2022-11-09 DIAGNOSIS — Z0000 Encounter for general adult medical examination without abnormal findings: Secondary | ICD-10-CM | POA: Diagnosis not present

## 2022-11-09 MED ORDER — PROGESTERONE MICRONIZED 100 MG PO CAPS: ORAL_CAPSULE | ORAL | 3 refills | Status: DC

## 2022-11-09 MED ORDER — ESTRADIOL 0.1 MG/24HR TD PTWK: 0.1000 mg | MEDICATED_PATCH | TRANSDERMAL | 3 refills | Status: DC

## 2022-11-09 NOTE — Progress Notes (Signed)
Subjective:    Patient ID: Natalie Mullen, female    DOB: Nov 15, 1966, 56 y.o.   MRN: [id number]  HPI Here for a well exam. She feels fine. She still plays a lot of tennis. She used to see Dr. Everardo All for her hypothyroidism and Addison's disease, but he has left the practice so she needs a new referral. Last year we started her on Estradiol patches and Progesterone, and these have done an excellent job of controlling her menopausal symptoms.    Review of Systems  Constitutional: Negative.   HENT: Negative.    Eyes: Negative.   Respiratory: Negative.    Cardiovascular: Negative.   Gastrointestinal: Negative.   Genitourinary:  Negative for decreased urine volume, difficulty urinating, dyspareunia, dysuria, enuresis, flank pain, frequency, hematuria, pelvic pain and urgency.  Musculoskeletal: Negative.   Skin: Negative.   Neurological: Negative.  Negative for headaches.  Psychiatric/Behavioral: Negative.         Objective:   Physical Exam Constitutional:      General: She is not in acute distress.    Appearance: She is well-developed.  HENT:     Head: Normocephalic and atraumatic.     Right Ear: External ear normal.     Left Ear: External ear normal.     Nose: Nose normal.     Mouth/Throat:     Pharynx: No oropharyngeal exudate.  Eyes:     General: No scleral icterus.    Conjunctiva/sclera: Conjunctivae normal.     Pupils: Pupils are equal, round, and reactive to light.  Neck:     Thyroid: No thyromegaly.     Vascular: No JVD.  Cardiovascular:     Rate and Rhythm: Normal rate and regular rhythm.     Heart sounds: Normal heart sounds. No murmur heard.    No friction rub. No gallop.  Pulmonary:     Effort: Pulmonary effort is normal. No respiratory distress.     Breath sounds: Normal breath sounds. No wheezing or rales.  Chest:     Chest wall: No tenderness.  Abdominal:     General: Bowel sounds are normal. There is no distension.     Palpations: Abdomen is soft. There  is no mass.     Tenderness: There is no abdominal tenderness. There is no guarding or rebound.  Musculoskeletal:        General: No tenderness. Normal range of motion.     Cervical back: Normal range of motion and neck supple.  Lymphadenopathy:     Cervical: No cervical adenopathy.  Skin:    General: Skin is warm and dry.     Findings: No erythema or rash.  Neurological:     Mental Status: She is alert and oriented to person, place, and time.     Motor: No weakness or abnormal muscle tone.     Coordination: Coordination normal.     Gait: Gait normal.     Deep Tendon Reflexes: Reflexes are normal and symmetric. Reflexes normal.     Comments: Slight ptosis of the right upper eyelid which is stable   Psychiatric:        Behavior: Behavior normal.        Thought Content: Thought content normal.        Judgment: Judgment normal.           Assessment & Plan:  Well exam. We discussed diet and exercise. Get fasting labs. Refer to Dr. Lonzo Cloud for hypothyroidism and Addison's disease.  Gershon Crane, MD

## 2022-11-10 LAB — CBC WITH DIFFERENTIAL/PLATELET
Basophils Absolute: 0 10*3/uL (ref 0.0–0.1)
Basophils Relative: 0.7 % (ref 0.0–3.0)
Eosinophils Absolute: 0.3 10*3/uL (ref 0.0–0.7)
Eosinophils Relative: 4.1 % (ref 0.0–5.0)
HCT: 37.8 % (ref 36.0–46.0)
Hemoglobin: 13 g/dL (ref 12.0–15.0)
Lymphocytes Relative: 31.1 % (ref 12.0–46.0)
Lymphs Abs: 1.9 10*3/uL (ref 0.7–4.0)
MCHC: 34.4 g/dL (ref 30.0–36.0)
MCV: 89.7 fl (ref 78.0–100.0)
Monocytes Absolute: 0.3 10*3/uL (ref 0.1–1.0)
Monocytes Relative: 5 % (ref 3.0–12.0)
Neutro Abs: 3.7 10*3/uL (ref 1.4–7.7)
Neutrophils Relative %: 59.1 % (ref 43.0–77.0)
Platelets: 284 10*3/uL (ref 150.0–400.0)
RBC: 4.22 Mil/uL (ref 3.87–5.11)
RDW: 12.9 % (ref 11.5–15.5)
WBC: 6.2 10*3/uL (ref 4.0–10.5)

## 2022-11-10 LAB — BASIC METABOLIC PANEL
BUN: 14 mg/dL (ref 6–23)
CO2: 25 mEq/L (ref 19–32)
Calcium: 9.7 mg/dL (ref 8.4–10.5)
Chloride: 104 mEq/L (ref 96–112)
Creatinine, Ser: 0.94 mg/dL (ref 0.40–1.20)
GFR: 66.8 mL/min (ref >60.00)
Glucose, Bld: 78 mg/dL (ref 70–99)
Potassium: 4.2 mEq/L (ref 3.5–5.1)
Sodium: 135 mEq/L (ref 135–145)

## 2022-11-10 LAB — HEPATIC FUNCTION PANEL
ALT: 18 U/L (ref 0–35)
AST: 22 U/L (ref 0–37)
Albumin: 3.9 g/dL (ref 3.5–5.2)
Alkaline Phosphatase: 52 U/L (ref 39–117)
Bilirubin, Direct: 0.2 mg/dL (ref 0.0–0.3)
Total Bilirubin: 0.8 mg/dL (ref 0.2–1.2)
Total Protein: 7.2 g/dL (ref 6.0–8.3)

## 2022-11-10 LAB — T3, FREE: T3, Free: 3.1 pg/mL (ref 2.3–4.2)

## 2022-11-10 LAB — HEMOGLOBIN A1C: Hgb A1c MFr Bld: 5.5 % (ref 4.6–6.5)

## 2022-11-10 LAB — T4, FREE: Free T4: 1.42 ng/dL (ref 0.60–1.60)

## 2022-11-10 LAB — LIPID PANEL
Cholesterol: 177 mg/dL (ref 0–200)
HDL: 38.1 mg/dL — ABNORMAL LOW (ref >39.00)
LDL Cholesterol: 117 mg/dL — ABNORMAL HIGH (ref 0–99)
NonHDL: 138.41
Total CHOL/HDL Ratio: 5
Triglycerides: 105 mg/dL (ref 0.0–149.0)
VLDL: 21 mg/dL (ref 0.0–40.0)

## 2022-11-10 LAB — TSH: TSH: 0.15 u[IU]/mL — ABNORMAL LOW (ref 0.35–5.50)

## 2022-12-20 ENCOUNTER — Other Ambulatory Visit: Payer: Self-pay

## 2022-12-20 DIAGNOSIS — E271 Primary adrenocortical insufficiency: Secondary | ICD-10-CM

## 2022-12-21 DIAGNOSIS — H5213 Myopia, bilateral: Secondary | ICD-10-CM | POA: Diagnosis not present

## 2022-12-21 DIAGNOSIS — H04123 Dry eye syndrome of bilateral lacrimal glands: Secondary | ICD-10-CM | POA: Diagnosis not present

## 2022-12-24 ENCOUNTER — Ambulatory Visit: Payer: BC Managed Care – PPO | Admitting: Family Medicine

## 2022-12-24 ENCOUNTER — Encounter: Payer: Self-pay | Admitting: Family Medicine

## 2022-12-24 VITALS — BP 94/62 | HR 82 | Temp 97.5°F | Wt 125.0 lb

## 2022-12-24 DIAGNOSIS — E038 Other specified hypothyroidism: Secondary | ICD-10-CM | POA: Diagnosis not present

## 2022-12-24 DIAGNOSIS — E063 Autoimmune thyroiditis: Secondary | ICD-10-CM

## 2022-12-24 DIAGNOSIS — E271 Primary adrenocortical insufficiency: Secondary | ICD-10-CM | POA: Diagnosis not present

## 2022-12-24 NOTE — Progress Notes (Signed)
   Subjective:    Patient ID: Natalie Mullen, female    DOB: Jul 19, 1966, 57 y.o.   MRN: [id number]  HPI Here to discuss her recent feelings of generalized weakness. She takes Dexamethasone and Fludrocortisone daily for Addison's disease. She recently wen ton a 4 day trip to [place], and she did not take these twl medications with her. She resumed taking them as soon as she get back home 6 days ago. When she first got back from [place] she felt very tired and very weak. No other symptoms. Now that she is taking the medications again, she is feeling better and better every day.    Review of Systems  Constitutional:  Positive for fatigue.  Respiratory: Negative.    Cardiovascular: Negative.   Gastrointestinal: Negative.   Genitourinary: Negative.        Objective:   Physical Exam Constitutional:      Appearance: Normal appearance.  Cardiovascular:     Rate and Rhythm: Normal rate and regular rhythm.     Pulses: Normal pulses.     Heart sounds: Normal heart sounds.  Pulmonary:     Effort: Pulmonary effort is normal.     Breath sounds: Normal breath sounds.  Neurological:     Mental Status: She is alert and oriented to person, place, and time. Mental status is at baseline.           Assessment & Plan:  She experienced weakness from suddenly stopping the Dexamethasone and the Fludrocortisone. Now that she is back on them, she is feeling better. She now knows that she cannot ever stop taking medications without consulting [place] first.  Alysia Penna, MD

## 2022-12-29 ENCOUNTER — Telehealth: Payer: BC Managed Care – PPO | Admitting: Family Medicine

## 2022-12-29 ENCOUNTER — Encounter: Payer: Self-pay | Admitting: Family Medicine

## 2022-12-29 DIAGNOSIS — J019 Acute sinusitis, unspecified: Secondary | ICD-10-CM

## 2022-12-29 MED ORDER — AZITHROMYCIN 250 MG PO TABS: ORAL_TABLET | ORAL | 0 refills | Status: DC

## 2022-12-29 NOTE — Progress Notes (Signed)
Subjective:    Patient ID: Natalie Mullen, female    DOB: January 07, 1966, 57 y.o.   MRN: [id number]  HPI Virtual Visit via Video Note  I connected with the patient on 12/29/22 at  2:30 PM EST by a video enabled telemedicine application and verified that I am speaking with the correct person using two identifiers.  Location patient: home Location provider:work or home office Persons participating in the virtual visit: patient, provider  I discussed the limitations of evaluation and management by telemedicine and the availability of in person appointments. The patient expressed understanding and agreed to proceed.   HPI: Here for an apparent sinus infection. For the past 3 weeks she has had sinus congestion, PND, and blowing yellow mucus from the nose. No cough or fever.    ROS: See pertinent positives and negatives per HPI.  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 Beryle Beams   Antiphospholipid antibody with hypercoagulable state ([place]) 03/27/2012   Initial presentation 05/2010 with acute left MCA stroke   Autoimmune hepatitis [facility])    sees Dr. 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 Earlean Shawl, clear, repeat in 10 yrs    LASIK     TONSILLECTOMY      Family History  Problem Relation Age of Onset   Heart disease Father    Arthritis Other    Stroke Other    Other Mother        Accident - had a fall     Current Outpatient Medications:    aspirin 81  MG EC tablet, Take 1 tablet (81 mg total) by mouth daily. Swallow whole., Disp: 30 tablet, Rfl: 12   azithromycin (ZITHROMAX Z-PAK) 250 MG tablet, As directed, Disp: 6 each, Rfl: 0   dexamethasone (DECADRON) 0.5 MG tablet, Take 1 tablet (0.5 mg total) by mouth daily. (Patient taking differently: Take 0.5 mg by mouth daily. Taking 0.25 mg), Disp: 90 tablet, Rfl: 3   estradiol (CLIMARA - DOSED IN MG/24 HR) 0.1 mg/24hr patch, Place 1 patch (0.1 mg total) onto the skin every 14 (fourteen) days., Disp: 6 patch, Rfl: 3   fludrocortisone (FLORINEF) 0.1 MG tablet, TAKE 1 TABLET(0.1 MG) BY MOUTH DAILY (Patient taking differently: 0.05 mg. TAKE 1 TABLET(0.1 MG) BY MOUTH DAILY), Disp: 90 tablet, Rfl: 3   levothyroxine (SYNTHROID) 200 MCG tablet, Take 1 tablet (200 mcg total) by mouth daily. (Patient taking differently: Take 200 mcg by mouth daily. Taking 100 mcg), Disp: 90 tablet, Rfl: 3   progesterone (PROMETRIUM) 100 MG capsule, TAKE 1 CAPSULE(100 MG) BY MOUTH DAILY, Disp: 90 capsule, Rfl: 3  EXAM:  VITALS per patient if applicable:  GENERAL: alert, oriented, appears well and in no acute distress  HEENT: atraumatic, conjunttiva clear, no obvious abnormalities on inspection of external nose and ears  NECK: normal movements of the head and neck  LUNGS: on inspection no signs of respiratory distress, breathing rate  appears normal, no obvious gross SOB, gasping or wheezing  CV: no obvious cyanosis  MS: moves all visible extremities without noticeable abnormality  PSYCH/NEURO: pleasant and cooperative, no obvious depression or anxiety, speech and thought processing grossly intact  ASSESSMENT AND PLAN: Sinusitis, treat with a Zpack.  Alysia Penna, MD  Discussed the following assessment and plan:  No diagnosis found.     I discussed the assessment and treatment plan with the patient. The patient was provided an opportunity to ask questions and all were answered. The patient agreed with the plan  and demonstrated an understanding of the instructions.   The patient was advised to call back or seek an in-person evaluation if the symptoms worsen or if the condition fails to improve as anticipated.      Review of Systems     Objective:   Physical Exam        Assessment & Plan:

## 2023-01-04 DIAGNOSIS — E038 Other specified hypothyroidism: Secondary | ICD-10-CM | POA: Diagnosis not present

## 2023-01-04 DIAGNOSIS — K754 Autoimmune hepatitis: Secondary | ICD-10-CM | POA: Diagnosis not present

## 2023-01-04 DIAGNOSIS — D510 Vitamin B12 deficiency anemia due to intrinsic factor deficiency: Secondary | ICD-10-CM | POA: Diagnosis not present

## 2023-01-04 DIAGNOSIS — Z8673 Personal history of transient ischemic attack (TIA), and cerebral infarction without residual deficits: Secondary | ICD-10-CM | POA: Diagnosis not present

## 2023-01-04 DIAGNOSIS — E271 Primary adrenocortical insufficiency: Secondary | ICD-10-CM | POA: Diagnosis not present

## 2023-01-04 DIAGNOSIS — D6861 Antiphospholipid syndrome: Secondary | ICD-10-CM | POA: Diagnosis not present

## 2023-03-24 DIAGNOSIS — A609 Anogenital herpesviral infection, unspecified: Secondary | ICD-10-CM | POA: Diagnosis not present

## 2023-04-08 DIAGNOSIS — E271 Primary adrenocortical insufficiency: Secondary | ICD-10-CM | POA: Diagnosis not present

## 2023-04-08 DIAGNOSIS — K754 Autoimmune hepatitis: Secondary | ICD-10-CM | POA: Diagnosis not present

## 2023-04-08 DIAGNOSIS — E038 Other specified hypothyroidism: Secondary | ICD-10-CM | POA: Diagnosis not present

## 2023-04-08 DIAGNOSIS — D6861 Antiphospholipid syndrome: Secondary | ICD-10-CM | POA: Diagnosis not present

## 2023-08-08 DIAGNOSIS — E7800 Pure hypercholesterolemia, unspecified: Secondary | ICD-10-CM | POA: Diagnosis not present

## 2023-08-08 DIAGNOSIS — E038 Other specified hypothyroidism: Secondary | ICD-10-CM | POA: Diagnosis not present

## 2023-08-08 DIAGNOSIS — E271 Primary adrenocortical insufficiency: Secondary | ICD-10-CM | POA: Diagnosis not present

## 2023-08-20 ENCOUNTER — Other Ambulatory Visit: Payer: Self-pay | Admitting: Family Medicine

## 2023-08-25 ENCOUNTER — Telehealth: Payer: Self-pay | Admitting: Family Medicine

## 2023-08-25 NOTE — Telephone Encounter (Signed)
Prescription Request  08/25/2023  LOV: 12/24/2022  What is the name of the medication or equipment? Estradiol. Pt states her pharmacy is stating they need a new prescription for them to fill it.   Have you contacted your pharmacy to request a refill? Yes   Which pharmacy would you like this sent to?  [facility] #[id number] [place] Otto, [place] - [address] AT [facility] [address] [facility] [address] [place] [address] Phone: [phone] Fax: [phone]    Patient notified that their request is being sent to the clinical staff for review and that they should receive a response within 2 business days.   Please advise at Mobile [phone] (mobile)

## 2023-08-26 ENCOUNTER — Other Ambulatory Visit: Payer: Self-pay

## 2023-08-26 MED ORDER — ESTRADIOL 0.1 MG/24HR TD PTWK: 0.1000 mg | MEDICATED_PATCH | TRANSDERMAL | 3 refills | Status: DC

## 2023-08-26 NOTE — Telephone Encounter (Signed)
Pt has been notified.

## 2023-08-26 NOTE — Telephone Encounter (Signed)
Rx sent 

## 2023-09-30 ENCOUNTER — Ambulatory Visit: Payer: BC Managed Care – PPO | Admitting: Internal Medicine

## 2023-11-11 ENCOUNTER — Encounter: Payer: Self-pay | Admitting: Family Medicine

## 2023-11-11 ENCOUNTER — Ambulatory Visit (INDEPENDENT_AMBULATORY_CARE_PROVIDER_SITE_OTHER): Payer: BC Managed Care – PPO | Admitting: Family Medicine

## 2023-11-11 VITALS — BP 96/60 | HR 54 | Temp 97.6°F | Ht 66.5 in | Wt 138.2 lb

## 2023-11-11 DIAGNOSIS — E063 Autoimmune thyroiditis: Secondary | ICD-10-CM | POA: Diagnosis not present

## 2023-11-11 DIAGNOSIS — Z0000 Encounter for general adult medical examination without abnormal findings: Secondary | ICD-10-CM | POA: Diagnosis not present

## 2023-11-11 DIAGNOSIS — E271 Primary adrenocortical insufficiency: Secondary | ICD-10-CM

## 2023-11-11 LAB — BASIC METABOLIC PANEL
BUN: 20 mg/dL (ref 6–23)
CO2: 28 meq/L (ref 19–32)
Calcium: 9.4 mg/dL (ref 8.4–10.5)
Chloride: 103 meq/L (ref 96–112)
Creatinine, Ser: 0.97 mg/dL (ref 0.40–1.20)
GFR: 63.88 mL/min (ref >60.00)
Glucose, Bld: 86 mg/dL (ref 70–99)
Potassium: 4.2 meq/L (ref 3.5–5.1)
Sodium: 136 meq/L (ref 135–145)

## 2023-11-11 LAB — HEPATIC FUNCTION PANEL
ALT: 19 U/L (ref 0–35)
AST: 18 U/L (ref 0–37)
Albumin: 4 g/dL (ref 3.5–5.2)
Alkaline Phosphatase: 62 U/L (ref 39–117)
Bilirubin, Direct: 0.1 mg/dL (ref 0.0–0.3)
Total Bilirubin: 0.7 mg/dL (ref 0.2–1.2)
Total Protein: 6.7 g/dL (ref 6.0–8.3)

## 2023-11-11 LAB — T3, FREE: T3, Free: 3.1 pg/mL (ref 2.3–4.2)

## 2023-11-11 LAB — LIPID PANEL
Cholesterol: 189 mg/dL (ref 0–200)
HDL: 45.5 mg/dL (ref >39.00)
LDL Cholesterol: 126 mg/dL — ABNORMAL HIGH (ref 0–99)
NonHDL: 143.87
Total CHOL/HDL Ratio: 4
Triglycerides: 90 mg/dL (ref 0.0–149.0)
VLDL: 18 mg/dL (ref 0.0–40.0)

## 2023-11-11 LAB — CBC WITH DIFFERENTIAL/PLATELET
Basophils Absolute: 0 10*3/uL (ref 0.0–0.1)
Basophils Relative: 0.8 % (ref 0.0–3.0)
Eosinophils Absolute: 0.2 10*3/uL (ref 0.0–0.7)
Eosinophils Relative: 4.1 % (ref 0.0–5.0)
HCT: 39.3 % (ref 36.0–46.0)
Hemoglobin: 13.3 g/dL (ref 12.0–15.0)
Lymphocytes Relative: 37.8 % (ref 12.0–46.0)
Lymphs Abs: 2.2 10*3/uL (ref 0.7–4.0)
MCHC: 33.9 g/dL (ref 30.0–36.0)
MCV: 93.8 fL (ref 78.0–100.0)
Monocytes Absolute: 0.5 10*3/uL (ref 0.1–1.0)
Monocytes Relative: 8.9 % (ref 3.0–12.0)
Neutro Abs: 2.8 10*3/uL (ref 1.4–7.7)
Neutrophils Relative %: 48.4 % (ref 43.0–77.0)
Platelets: 276 10*3/uL (ref 150.0–400.0)
RBC: 4.19 Mil/uL (ref 3.87–5.11)
RDW: 12.8 % (ref 11.5–15.5)
WBC: 5.8 10*3/uL (ref 4.0–10.5)

## 2023-11-11 LAB — T4, FREE: Free T4: 1.9 ng/dL — ABNORMAL HIGH (ref 0.60–1.60)

## 2023-11-11 LAB — HEMOGLOBIN A1C: Hgb A1c MFr Bld: 5.6 % (ref 4.6–6.5)

## 2023-11-11 LAB — TSH: TSH: 0.03 u[IU]/mL — ABNORMAL LOW (ref 0.35–5.50)

## 2023-11-11 MED ORDER — PROGESTERONE MICRONIZED 100 MG PO CAPS: ORAL_CAPSULE | ORAL | 3 refills | Status: DC

## 2023-11-11 MED ORDER — DEXAMETHASONE 0.5 MG PO TABS: 0.2500 mg | ORAL_TABLET | Freq: Every day | ORAL | 3 refills | Status: DC

## 2023-11-11 MED ORDER — FLUDROCORTISONE ACETATE 0.1 MG PO TABS: 0.0500 mg | ORAL_TABLET | Freq: Every day | ORAL | 3 refills | Status: DC

## 2023-11-11 MED ORDER — LEVOTHYROXINE SODIUM 100 MCG PO TABS
100.0000 ug | ORAL_TABLET | Freq: Every day | ORAL | 3 refills | Status: DC
Start: 2023-11-11 — End: 2023-11-14

## 2023-11-11 MED ORDER — ESTRADIOL 0.1 MG/24HR TD PTWK: 0.1000 mg | MEDICATED_PATCH | TRANSDERMAL | 3 refills | Status: DC

## 2023-11-11 NOTE — Progress Notes (Signed)
   Subjective:    Patient ID: Natalie Mullen, female    DOB: 15-Dec-1965, 57 y.o.   MRN: [id number]  HPI Here for a well exam. She feels great. She still plays a lot of tennis.    Review of Systems  Constitutional: Negative.   HENT: Negative.    Eyes: Negative.   Respiratory: Negative.    Cardiovascular: Negative.   Gastrointestinal: Negative.   Genitourinary:  Negative for decreased urine volume, difficulty urinating, dyspareunia, dysuria, enuresis, flank pain, frequency, hematuria, pelvic pain and urgency.  Musculoskeletal: Negative.   Skin: Negative.   Neurological: Negative.  Negative for headaches.  Psychiatric/Behavioral: Negative.         Objective:   Physical Exam Constitutional:      General: She is not in acute distress.    Appearance: Normal appearance. She is well-developed.  HENT:     Head: Normocephalic and atraumatic.     Right Ear: External ear normal.     Left Ear: External ear normal.     Nose: Nose normal.     Mouth/Throat:     Pharynx: No oropharyngeal exudate.  Eyes:     General: No scleral icterus.    Conjunctiva/sclera: Conjunctivae normal.     Pupils: Pupils are equal, round, and reactive to light.  Neck:     Thyroid: No thyromegaly.     Vascular: No JVD.  Cardiovascular:     Rate and Rhythm: Normal rate and regular rhythm.     Heart sounds: Normal heart sounds. No murmur heard.    No friction rub. No gallop.  Pulmonary:     Effort: Pulmonary effort is normal. No respiratory distress.     Breath sounds: Normal breath sounds. No wheezing or rales.  Chest:     Chest wall: No tenderness.  Abdominal:     General: Bowel sounds are normal. There is no distension.     Palpations: Abdomen is soft. There is no mass.     Tenderness: There is no abdominal tenderness. There is no guarding or rebound.  Musculoskeletal:        General: No tenderness. Normal range of motion.     Cervical back: Normal range of motion and neck supple.  Lymphadenopathy:      Cervical: No cervical adenopathy.  Skin:    General: Skin is warm and dry.     Findings: No erythema or rash.  Neurological:     General: No focal deficit present.     Mental Status: She is alert and oriented to person, place, and time.     Cranial Nerves: No cranial nerve deficit.     Motor: No abnormal muscle tone.     Coordination: Coordination normal.     Deep Tendon Reflexes: Reflexes are normal and symmetric. Reflexes normal.  Psychiatric:        Mood and Affect: Mood normal.        Behavior: Behavior normal.        Thought Content: Thought content normal.        Judgment: Judgment normal.           Assessment & Plan:  Well exam. We discussed diet and exercise. Get fasting labs. Gershon Crane, MD

## 2023-11-14 ENCOUNTER — Other Ambulatory Visit: Payer: Self-pay | Admitting: *Deleted

## 2023-11-14 DIAGNOSIS — E063 Autoimmune thyroiditis: Secondary | ICD-10-CM

## 2023-11-14 MED ORDER — LEVOTHYROXINE SODIUM 88 MCG PO TABS: 88.0000 ug | ORAL_TABLET | Freq: Every day | ORAL | 3 refills | Status: DC

## 2023-11-17 ENCOUNTER — Telehealth: Payer: Self-pay

## 2023-11-17 NOTE — Telephone Encounter (Unsigned)
Copied from CRM [id number]. Topic: Clinical - Medication Question >> Nov 17, 2023  1:12 PM Almira Coaster wrote: Reason for CRM: Patient is calling because they had their physical on 11/11/2023 and during the visit the patient forgot to advise Dr.Fry that she will be going on a cruise on Jan 4th and is requesting motion sickness/ sea sickness medication. It is a 7 day cruise. Patient's call back number is [phone]

## 2023-11-18 MED ORDER — SCOPOLAMINE 1 MG/3DAYS TD PT72: 1.0000 | MEDICATED_PATCH | TRANSDERMAL | 2 refills | Status: DC

## 2023-11-18 NOTE — Telephone Encounter (Signed)
I sent in the RX  

## 2023-12-30 ENCOUNTER — Other Ambulatory Visit: Payer: Self-pay | Admitting: Family Medicine

## 2024-01-09 ENCOUNTER — Other Ambulatory Visit: Payer: Self-pay | Admitting: Internal Medicine

## 2024-01-09 DIAGNOSIS — E039 Hypothyroidism, unspecified: Secondary | ICD-10-CM

## 2024-01-11 ENCOUNTER — Ambulatory Visit: Payer: BC Managed Care – PPO | Admitting: Internal Medicine

## 2024-01-18 DIAGNOSIS — H5213 Myopia, bilateral: Secondary | ICD-10-CM | POA: Diagnosis not present

## 2024-01-18 DIAGNOSIS — H04123 Dry eye syndrome of bilateral lacrimal glands: Secondary | ICD-10-CM | POA: Diagnosis not present

## 2024-02-13 ENCOUNTER — Other Ambulatory Visit (INDEPENDENT_AMBULATORY_CARE_PROVIDER_SITE_OTHER): Payer: BC Managed Care – PPO

## 2024-02-13 DIAGNOSIS — E063 Autoimmune thyroiditis: Secondary | ICD-10-CM

## 2024-02-13 LAB — T3, FREE: T3, Free: 3.5 pg/mL (ref 2.3–4.2)

## 2024-02-13 LAB — TSH: TSH: 0.8 u[IU]/mL (ref 0.35–5.50)

## 2024-02-14 ENCOUNTER — Encounter: Payer: Self-pay | Admitting: *Deleted

## 2024-02-22 DIAGNOSIS — E271 Primary adrenocortical insufficiency: Secondary | ICD-10-CM | POA: Diagnosis not present

## 2024-03-02 ENCOUNTER — Encounter: Payer: Self-pay | Admitting: Family Medicine

## 2024-03-02 ENCOUNTER — Ambulatory Visit: Admitting: Family Medicine

## 2024-03-02 VITALS — BP 100/62 | HR 50 | Temp 97.9°F | Ht 66.0 in | Wt 142.0 lb

## 2024-03-02 DIAGNOSIS — T148XXA Other injury of unspecified body region, initial encounter: Secondary | ICD-10-CM | POA: Diagnosis not present

## 2024-03-02 NOTE — Progress Notes (Signed)
   Established Patient Office Visit   Subjective:  Patient ID: Natalie Mullen, female    DOB: 02-05-1966  Age: 58 y.o. MRN: [id number]  Chief Complaint  Patient presents with   Bleeding/Bruising    HPI Patient is experiencing bruising only on her right forearm. She reports this started intermittently about 4-6 months ago. She reports it has occurred three times and usually last about 5 days. She reports the bruises does not itch, but finds herself scratching only the right forearm, which makes the bruising worse. No pain or injury noted.   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 She last seen oncology on January 12/14/2017. Also, had a stroke in 2011. Prior to stroke she was dominant in her right hand, but after the stroke she has become more dominant in her left hand.   ROS See HPI above     Objective:   BP 100/62   Pulse (!) 50   Temp 97.9 F (36.6 C) (Oral)   Ht 5\' 6"  (1.676 m)   Wt 142 lb (64.4 kg)   LMP 02/02/2016   SpO2 99%   BMI 22.92 kg/m    Physical Exam Vitals reviewed.  Constitutional:      General: She is not in acute distress.    Appearance: Normal appearance. She is not ill-appearing, toxic-appearing or diaphoretic.  Eyes:     General:        Right eye: No discharge.        Left eye: No discharge.     Conjunctiva/sclera: Conjunctivae normal.  Cardiovascular:     Rate and Rhythm: Normal rate and regular rhythm.     Heart sounds: Normal heart sounds. No murmur heard.    No friction rub. No gallop.  Pulmonary:     Effort: Pulmonary effort is normal. No respiratory distress.     Breath sounds: Normal breath sounds.  Musculoskeletal:        General: Normal range of motion.  Skin:    General: Skin is warm and dry.     Findings: Bruising (Right forearm. See picture included.) present.  Neurological:     General: No focal deficit present.     Mental Status: She is alert and  oriented to person, place, and time. Mental status is at baseline.  Psychiatric:        Mood and Affect: Mood normal.        Behavior: Behavior normal.        Thought Content: Thought content normal.        Judgment: Judgment normal.       Assessment & Plan:  Bruising -     CBC with Differential/Platelet -     Protime-INR -     APTT  -Ordered CBC, PT/INR, and PTT to assess concerns with blood clotting that can cause bruising.  -Consulted with Dr. Clent Ridges, her PCP and this providers supervising physician, about patients visit. After labs are resulted. Will send him the note and he place the referral so that he will be able to keep up with her progress.   Zandra Abts, NP

## 2024-03-02 NOTE — Patient Instructions (Addendum)
-  Ordered labs. Office will call with lab results.  -Consulted with Dr. Clent Ridges about your visit, he is going to place the referral to oncology after labs have results.  -It was a pleasure taking care of you.

## 2024-03-03 LAB — CBC WITH DIFFERENTIAL/PLATELET
Absolute Lymphocytes: 1488 {cells}/uL (ref 850–3900)
Absolute Monocytes: 360 {cells}/uL (ref 200–950)
Basophils Absolute: 31 {cells}/uL (ref 0–200)
Basophils Relative: 0.5 %
Eosinophils Absolute: 81 {cells}/uL (ref 15–500)
Eosinophils Relative: 1.3 %
HCT: 37.6 % (ref 35.0–45.0)
Hemoglobin: 13 g/dL (ref 11.7–15.5)
MCH: 31.1 pg (ref 27.0–33.0)
MCHC: 34.6 g/dL (ref 32.0–36.0)
MCV: 90 fL (ref 80.0–100.0)
MPV: 10.6 fL (ref 7.5–12.5)
Monocytes Relative: 5.8 %
Neutro Abs: 4241 {cells}/uL (ref 1500–7800)
Neutrophils Relative %: 68.4 %
Platelets: 298 10*3/uL (ref 140–400)
RBC: 4.18 10*6/uL (ref 3.80–5.10)
RDW: 12.4 % (ref 11.0–15.0)
Total Lymphocyte: 24 %
WBC: 6.2 10*3/uL (ref 3.8–10.8)

## 2024-03-03 LAB — APTT: aPTT: 40 s — ABNORMAL HIGH (ref 23–32)

## 2024-03-03 LAB — PROTIME-INR
INR: 1.1
Prothrombin Time: 11.8 s — ABNORMAL HIGH (ref 9.0–11.5)

## 2024-03-05 ENCOUNTER — Encounter: Payer: Self-pay | Admitting: Family Medicine

## 2024-05-08 ENCOUNTER — Ambulatory Visit: Payer: Self-pay

## 2024-05-08 NOTE — Telephone Encounter (Signed)
 Reason for Disposition . All other insect bites  Answer Assessment - Initial Assessment Questions 1. TYPE of INSECT: What type of insect was it?      unknown 2. ONSET: When did you get bitten?      2 weeks 3. LOCATION: Where is the insect bite located?      On right upper leg 4. REDNESS: Is the area red or pink? If Yes, ask: What size is area of redness? (inches or cm). When did the redness start?     no 5. PAIN: Is there any pain? If Yes, ask: How bad is it?  (Scale 1-10; or mild, moderate, severe)     no 6. ITCHING: Does it itch? If Yes, ask: How bad is the itch?    - MILD: doesn't interfere with normal activities   - MODERATE-SEVERE: interferes with work, school, sleep, or other activities      no 7. SWELLING: How big is the swelling? (inches, cm, or compare to coins)     no 8. OTHER SYMPTOMS: Do you have any other symptoms?  (e.g., difficulty breathing, hives)     Has a scar wants to know what this is  Protocols used: Insect Bite-A-AH

## 2024-05-08 NOTE — Telephone Encounter (Signed)
 Appointment scheduled for 05/09/24

## 2024-05-08 NOTE — Telephone Encounter (Signed)
 When agent tried to transfer patient to Nurse triage, error occurred with transfer. This RN attempted to call patient but unable to speak with patient. Voicemail full and unable to leave a message. Will place in call backs.   Copied from CRM [id number]. Topic: Clinical - Red Word Triage >> May 08, 2024  9:17 AM Alethia Huxley E wrote: [facility] that prompted transfer to Nurse Triage: Animal / insect bite. Patient thinks she was bit by something that left a scar on her upper right leg. Going on 2 weeks.

## 2024-05-08 NOTE — Telephone Encounter (Signed)
 FYI Only or Action Required?: FYI only for provider  Patient was last seen in primary care on 03/02/2024 by Francenia Ingle, NP. Called Nurse Triage reporting Insect Bite. Symptoms Odd looking scar at bite began several days ago. Interventions attempted: Nothing. Symptoms are: stable.  Triage Disposition: Home Care  Patient/caregiver understands and will follow disposition?: no - wants to be seen

## 2024-05-09 ENCOUNTER — Telehealth: Admitting: Family Medicine

## 2024-05-09 ENCOUNTER — Encounter: Payer: Self-pay | Admitting: Family Medicine

## 2024-05-09 DIAGNOSIS — S70361A Insect bite (nonvenomous), right thigh, initial encounter: Secondary | ICD-10-CM

## 2024-05-09 DIAGNOSIS — W57XXXA Bitten or stung by nonvenomous insect and other nonvenomous arthropods, initial encounter: Secondary | ICD-10-CM

## 2024-05-09 NOTE — Progress Notes (Signed)
 Subjective:    Patient ID: Natalie Mullen, female    DOB: 07-16-1966, 58 y.o.   MRN: [id number]  HPI Virtual Visit via Video Note  I connected with the patient on 05/09/24 at  3:45 PM EDT by a video enabled telemedicine application and verified that I am speaking with the correct person using two identifiers.  Location patient: home Location provider:work or home office Persons participating in the virtual visit: patient, provider  I discussed the limitations of evaluation and management by telemedicine and the availability of in person appointments. The patient expressed understanding and agreed to proceed.   HPI: Here asking about an apparent bug bite on her right thigh. She first noticed it about 2 weeks ago. At first it was raised, but now it is flat, and it is getting smaller day by day. At first it itched, but not now. She feels fine in general.    ROS: See pertinent positives and negatives per HPI.  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 Isidor Marek   Antiphospholipid antibody with hypercoagulable state (HCC) 03/27/2012   Initial presentation 05/2010 with acute left MCA stroke   Autoimmune hepatitis [facility])    sees Dr. 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 Andriette Keeling, clear, repeat in 10 yrs    LASIK     TONSILLECTOMY      Family History  Problem Relation Age of Onset   Heart disease Father    Arthritis Other    Stroke Other    Other  Mother        Accident - had a fall     Current Outpatient Medications:    dexamethasone  (DECADRON ) 0.5 MG tablet, Take 0.5 tablets (0.25 mg total) by mouth daily., Disp: 90 tablet, Rfl: 3   estradiol  (CLIMARA  - DOSED IN MG/24 HR) 0.1 mg/24hr patch, Place 1 patch (0.1 mg total) onto the skin every 14 (fourteen) days., Disp: 45 patch, Rfl: 3   fludrocortisone  (FLORINEF ) 0.1 MG tablet, Take 0.5 tablets (0.05 mg total) by mouth daily. TAKE 1 TABLET(0.1 MG) BY MOUTH DAILY, Disp: 45 tablet, Rfl: 3   levothyroxine  (SYNTHROID ) 88 MCG tablet, Take 1 tablet (88 mcg total) by mouth daily., Disp: 90 tablet, Rfl: 3   progesterone  (PROMETRIUM ) 100 MG capsule, TAKE 1 CAPSULE(100 MG) BY MOUTH DAILY, Disp: 90 capsule, Rfl: 3  EXAM:  VITALS per patient if applicable:  GENERAL: alert, oriented, appears well and in no acute distress  HEENT: atraumatic, conjunttiva clear, no obvious abnormalities on inspection of external nose and ears  NECK: normal movements of the head and neck  LUNGS: on inspection no signs of respiratory distress, breathing rate appears normal, no obvious gross SOB, gasping or wheezing  CV: no obvious cyanosis  MS: moves all visible extremities without noticeable abnormality SKIN: there is a 1 cm macular red spot on the anterior right thigh PSYCH/NEURO: pleasant and cooperative, no obvious depression  or anxiety, speech and thought processing grossly intact  ASSESSMENT AND PLAN: This does appear to be an insect bite of some sort. It is healing as expected. I reassured her that we do not need to treat it with anything. She will follow up as needed.  [name], MD  Discussed the following assessment and plan:  No diagnosis found.     I discussed the assessment and treatment plan with the patient. The patient was provided an opportunity to ask questions and all were answered. The patient agreed with the plan and demonstrated an understanding of the instructions.   The patient  was advised to call back or seek an in-person evaluation if the symptoms worsen or if the condition fails to improve as anticipated.      Review of Systems     Objective:   Physical Exam        Assessment & Plan:

## 2024-07-17 ENCOUNTER — Telehealth: Payer: Self-pay | Admitting: Family Medicine

## 2024-07-17 DIAGNOSIS — Z0000 Encounter for general adult medical examination without abnormal findings: Secondary | ICD-10-CM

## 2024-07-17 NOTE — Telephone Encounter (Signed)
 I did the referral

## 2024-07-17 NOTE — Telephone Encounter (Signed)
 Copied from CRM [id number]. Topic: Referral - Request for Referral >> Jul 17, 2024  3:38 PM Henretta I wrote: Did the patient discuss referral with their provider in the last year? No  (If No - schedule appointment) (If Yes - send message)  Appointment offered? Yes   Type of order/referral and detailed reason for visit: Colonoscopy   Preference of office, provider, location: Dr.Mannjyothinat, [facility] or whoever the doctor recommends   If referral order, have you been seen by this specialty before? Yes, had colonoscopy 10 year ago  (If Yes, this issue or another issue? When? Where?  Can we respond through MyChart? Yes

## 2024-07-25 ENCOUNTER — Telehealth: Payer: Self-pay

## 2024-07-25 NOTE — Telephone Encounter (Signed)
 Copied from CRM #[id number]. Topic: Referral - Status >> Jul 24, 2024 11:53 AM Burnard DEL wrote: Reason for CRM: [facility] Dr Kristie office called in stating that would not be able to see patient,they received a referral from PCP

## 2024-09-12 DIAGNOSIS — E038 Other specified hypothyroidism: Secondary | ICD-10-CM | POA: Diagnosis not present

## 2024-09-12 DIAGNOSIS — E7800 Pure hypercholesterolemia, unspecified: Secondary | ICD-10-CM | POA: Diagnosis not present

## 2024-09-12 DIAGNOSIS — D510 Vitamin B12 deficiency anemia due to intrinsic factor deficiency: Secondary | ICD-10-CM | POA: Diagnosis not present

## 2024-09-12 DIAGNOSIS — E271 Primary adrenocortical insufficiency: Secondary | ICD-10-CM | POA: Diagnosis not present

## 2024-10-25 ENCOUNTER — Other Ambulatory Visit: Payer: Self-pay

## 2024-10-25 DIAGNOSIS — E271 Primary adrenocortical insufficiency: Secondary | ICD-10-CM

## 2024-10-25 MED ORDER — FLUDROCORTISONE ACETATE 0.1 MG PO TABS: 0.0500 mg | ORAL_TABLET | Freq: Every day | ORAL | 3 refills | Status: AC

## 2024-11-11 ENCOUNTER — Other Ambulatory Visit: Payer: Self-pay | Admitting: Family Medicine

## 2024-11-28 ENCOUNTER — Encounter: Payer: Self-pay | Admitting: Family Medicine

## 2024-11-28 ENCOUNTER — Ambulatory Visit: Admitting: Family Medicine

## 2024-11-28 VITALS — BP 96/60 | HR 49 | Temp 97.6°F | Ht 66.0 in | Wt 133.8 lb

## 2024-11-28 DIAGNOSIS — Z0000 Encounter for general adult medical examination without abnormal findings: Secondary | ICD-10-CM

## 2024-11-28 DIAGNOSIS — E063 Autoimmune thyroiditis: Secondary | ICD-10-CM

## 2024-11-28 LAB — BASIC METABOLIC PANEL WITH GFR
BUN: 23 mg/dL (ref 6–23)
CO2: 25 meq/L (ref 19–32)
Calcium: 9.9 mg/dL (ref 8.4–10.5)
Chloride: 102 meq/L (ref 96–112)
Creatinine, Ser: 0.84 mg/dL (ref 0.40–1.20)
GFR: 75.36 mL/min
Glucose, Bld: 83 mg/dL (ref 70–99)
Potassium: 4.3 meq/L (ref 3.5–5.1)
Sodium: 131 meq/L — ABNORMAL LOW (ref 135–145)

## 2024-11-28 LAB — TSH: TSH: 0.65 u[IU]/mL (ref 0.35–5.50)

## 2024-11-28 LAB — CBC WITH DIFFERENTIAL/PLATELET
Basophils Absolute: 0 K/uL (ref 0.0–0.1)
Basophils Relative: 0.5 % (ref 0.0–3.0)
Eosinophils Absolute: 0.1 K/uL (ref 0.0–0.7)
Eosinophils Relative: 2.5 % (ref 0.0–5.0)
HCT: 38 % (ref 36.0–46.0)
Hemoglobin: 12.9 g/dL (ref 12.0–15.0)
Lymphocytes Relative: 35.2 % (ref 12.0–46.0)
Lymphs Abs: 1.9 K/uL (ref 0.7–4.0)
MCHC: 33.9 g/dL (ref 30.0–36.0)
MCV: 90.2 fl (ref 78.0–100.0)
Monocytes Absolute: 0.4 K/uL (ref 0.1–1.0)
Monocytes Relative: 6.5 % (ref 3.0–12.0)
Neutro Abs: 3 K/uL (ref 1.4–7.7)
Neutrophils Relative %: 55.3 % (ref 43.0–77.0)
Platelets: 263 K/uL (ref 150.0–400.0)
RBC: 4.21 Mil/uL (ref 3.87–5.11)
RDW: 13.6 % (ref 11.5–15.5)
WBC: 5.5 K/uL (ref 4.0–10.5)

## 2024-11-28 LAB — LIPID PANEL
Cholesterol: 165 mg/dL (ref 28–200)
HDL: 39.3 mg/dL
LDL Cholesterol: 104 mg/dL — ABNORMAL HIGH (ref 10–99)
NonHDL: 125.43
Total CHOL/HDL Ratio: 4
Triglycerides: 105 mg/dL (ref 10.0–149.0)
VLDL: 21 mg/dL (ref 0.0–40.0)

## 2024-11-28 LAB — HEMOGLOBIN A1C: Hgb A1c MFr Bld: 5.5 % (ref 4.6–6.5)

## 2024-11-28 LAB — T3, FREE: T3, Free: 9.5 pg/mL — ABNORMAL HIGH (ref 2.3–4.2)

## 2024-11-28 LAB — HEPATIC FUNCTION PANEL
ALT: 21 U/L (ref 3–35)
AST: 21 U/L (ref 5–37)
Albumin: 4.1 g/dL (ref 3.5–5.2)
Alkaline Phosphatase: 65 U/L (ref 39–117)
Bilirubin, Direct: 0 mg/dL — ABNORMAL LOW (ref 0.1–0.3)
Total Bilirubin: 0.5 mg/dL (ref 0.2–1.2)
Total Protein: 7.6 g/dL (ref 6.0–8.3)

## 2024-11-28 LAB — T4, FREE: Free T4: 0.48 ng/dL — ABNORMAL LOW (ref 0.60–1.60)

## 2024-11-28 MED ORDER — LEVOTHYROXINE SODIUM 88 MCG PO TABS: 88.0000 ug | ORAL_TABLET | Freq: Every day | ORAL | 3 refills | Status: AC

## 2024-11-28 MED ORDER — DEXAMETHASONE 0.5 MG PO TABS: 0.5000 mg | ORAL_TABLET | Freq: Every day | ORAL | 3 refills | Status: AC

## 2024-11-28 MED ORDER — ESTRADIOL 0.1 MG/24HR TD PTWK: 0.1000 mg | MEDICATED_PATCH | TRANSDERMAL | 3 refills | Status: DC

## 2024-11-28 NOTE — Progress Notes (Signed)
 "  Subjective:    Patient ID: Natalie Mullen, female    DOB: 02-27-1966, 59 y.o.   MRN: [id number]  HPI Here for a well exam. She has a few issues to discuss. First she has frequent constipation, and this makes her feel bloated. There is no pain. She drinks lots of water. Second she has a constant runny nose throughout the year. Third she has had 3 headaches in the past 3 months, and she normally never gets a headache. These always come in the left parietal area, they are sharp but not severe, and they only last 1-2 seconds. The last time this happened was 3 weeks ago.    Review of Systems  Constitutional: Negative.   HENT:  Positive for rhinorrhea.   Eyes: Negative.   Respiratory: Negative.    Cardiovascular: Negative.   Gastrointestinal:  Positive for constipation. Negative for abdominal pain, blood in stool, diarrhea, nausea, rectal pain and vomiting.  Genitourinary:  Negative for decreased urine volume, difficulty urinating, dyspareunia, dysuria, enuresis, flank pain, frequency, hematuria, pelvic pain and urgency.  Musculoskeletal: Negative.   Skin: Negative.   Neurological:  Positive for headaches.  Psychiatric/Behavioral: Negative.         Objective:   Physical Exam Constitutional:      General: She is not in acute distress.    Appearance: Normal appearance. She is well-developed.  HENT:     Head: Normocephalic and atraumatic.     Right Ear: External ear normal.     Left Ear: External ear normal.     Nose: Nose normal.     Mouth/Throat:     Pharynx: No oropharyngeal exudate.  Eyes:     General: No scleral icterus.    Conjunctiva/sclera: Conjunctivae normal.     Pupils: Pupils are equal, round, and reactive to light.  Neck:     Thyroid : No thyromegaly.     Vascular: No JVD.  Cardiovascular:     Rate and Rhythm: Normal rate and regular rhythm.     Pulses: Normal pulses.     Heart sounds: Normal heart sounds. No murmur heard.    No friction rub. No gallop.  Pulmonary:      Effort: Pulmonary effort is normal. No respiratory distress.     Breath sounds: Normal breath sounds. No wheezing or rales.  Chest:     Chest wall: No tenderness.  Abdominal:     General: Bowel sounds are normal. There is no distension.     Palpations: Abdomen is soft. There is no mass.     Tenderness: There is no abdominal tenderness. There is no guarding or rebound.  Musculoskeletal:        General: No tenderness. Normal range of motion.     Cervical back: Normal range of motion and neck supple.  Lymphadenopathy:     Cervical: No cervical adenopathy.  Skin:    General: Skin is warm and dry.     Findings: No erythema or rash.  Neurological:     General: No focal deficit present.     Mental Status: She is alert and oriented to person, place, and time.     Cranial Nerves: No cranial nerve deficit.     Motor: No abnormal muscle tone.     Coordination: Coordination normal.     Deep Tendon Reflexes: Reflexes are normal and symmetric. Reflexes normal.  Psychiatric:        Mood and Affect: Mood normal.        Behavior: Behavior normal.  Thought Content: Thought content normal.        Judgment: Judgment normal.           Assessment & Plan:  Well exam. We discussed diet and exercise. Get fasting labs. For the runny nose, she will try using Flonase  sprays and Zyrtec daily. For the constipation, she will try Miralax every day. She is due for another colonoscopy also, so we will set this up. For the headaches, since these are so infrequent and fleeting, I do not think they represent any serious problems. We agreed to simply monitor these and she will follow up if they become more frequent or last longer.  Garnette Olmsted, MD    "

## 2024-11-30 ENCOUNTER — Encounter: Payer: Self-pay | Admitting: Family Medicine

## 2024-11-30 ENCOUNTER — Ambulatory Visit: Payer: Self-pay | Admitting: Family Medicine

## 2024-12-25 ENCOUNTER — Telehealth: Payer: Self-pay

## 2024-12-25 MED ORDER — ESTRADIOL 0.1 MG/24HR TD PTWK: 0.1000 mg | MEDICATED_PATCH | Freq: Every day | TRANSDERMAL | 3 refills | Status: AC

## 2024-12-25 NOTE — Telephone Encounter (Signed)
 I sent in a new RX to apply a patch every day
